# Patient Record
Sex: Female | Born: 1965
Health system: Southern US, Community
[De-identification: ages and names within clinical notes are randomized; demographics above are authoritative.]

## PROBLEM LIST (undated history)

## (undated) DIAGNOSIS — E1165 Type 2 diabetes mellitus with hyperglycemia: Secondary | ICD-10-CM

## (undated) DIAGNOSIS — K219 Gastro-esophageal reflux disease without esophagitis: Secondary | ICD-10-CM

## (undated) DIAGNOSIS — E669 Obesity, unspecified: Secondary | ICD-10-CM

## (undated) DIAGNOSIS — M549 Dorsalgia, unspecified: Secondary | ICD-10-CM

## (undated) DIAGNOSIS — R5383 Other fatigue: Secondary | ICD-10-CM

## (undated) DIAGNOSIS — M2550 Pain in unspecified joint: Secondary | ICD-10-CM

## (undated) DIAGNOSIS — E785 Hyperlipidemia, unspecified: Secondary | ICD-10-CM

## (undated) DIAGNOSIS — M542 Cervicalgia: Secondary | ICD-10-CM

## (undated) DIAGNOSIS — M999 Biomechanical lesion, unspecified: Secondary | ICD-10-CM

## (undated) DIAGNOSIS — I10 Essential (primary) hypertension: Secondary | ICD-10-CM

## (undated) DIAGNOSIS — R0609 Other forms of dyspnea: Secondary | ICD-10-CM

## (undated) DIAGNOSIS — Z8619 Personal history of other infectious and parasitic diseases: Secondary | ICD-10-CM

## (undated) DIAGNOSIS — S46819A Strain of other muscles, fascia and tendons at shoulder and upper arm level, unspecified arm, initial encounter: Secondary | ICD-10-CM

## (undated) DIAGNOSIS — K829 Disease of gallbladder, unspecified: Secondary | ICD-10-CM

## (undated) DIAGNOSIS — E538 Deficiency of other specified B group vitamins: Secondary | ICD-10-CM

## (undated) DIAGNOSIS — E7849 Other hyperlipidemia: Secondary | ICD-10-CM

## (undated) DIAGNOSIS — R7989 Other specified abnormal findings of blood chemistry: Secondary | ICD-10-CM

## (undated) DIAGNOSIS — F418 Other specified anxiety disorders: Secondary | ICD-10-CM

## (undated) DIAGNOSIS — E119 Type 2 diabetes mellitus without complications: Secondary | ICD-10-CM

## (undated) DIAGNOSIS — E559 Vitamin D deficiency, unspecified: Secondary | ICD-10-CM

## (undated) DIAGNOSIS — Z8669 Personal history of other diseases of the nervous system and sense organs: Secondary | ICD-10-CM

## (undated) DIAGNOSIS — E118 Type 2 diabetes mellitus with unspecified complications: Secondary | ICD-10-CM

## (undated) HISTORY — DX: Personal history of other infectious and parasitic diseases: Z86.19

## (undated) HISTORY — DX: Other specified anxiety disorders: F41.8

## (undated) HISTORY — DX: Other specified abnormal findings of blood chemistry: R79.89

## (undated) HISTORY — PX: KIDNEY STONE SURGERY: SHX686

## (undated) HISTORY — DX: Personal history of other diseases of the nervous system and sense organs: Z86.69

## (undated) HISTORY — DX: Cervicalgia: M54.2

## (undated) HISTORY — DX: Essential (primary) hypertension: I10

## (undated) HISTORY — PX: EXPLORATORY LAPAROTOMY: SUR591

## (undated) HISTORY — DX: Other forms of dyspnea: R06.09

## (undated) HISTORY — DX: Disease of gallbladder, unspecified: K82.9

## (undated) HISTORY — DX: Deficiency of other specified B group vitamins: E53.8

## (undated) HISTORY — DX: Vitamin D deficiency, unspecified: E55.9

## (undated) HISTORY — PX: CHOLECYSTECTOMY: SHX55

## (undated) HISTORY — DX: Other hyperlipidemia: E78.49

## (undated) HISTORY — DX: Type 2 diabetes mellitus with hyperglycemia: E11.65

## (undated) HISTORY — DX: Pain in unspecified joint: M25.50

## (undated) HISTORY — DX: Strain of other muscles, fascia and tendons at shoulder and upper arm level, unspecified arm, initial encounter: S46.819A

## (undated) HISTORY — DX: Type 2 diabetes mellitus without complications: E11.9

## (undated) HISTORY — DX: Biomechanical lesion, unspecified: M99.9

## (undated) HISTORY — DX: Type 2 diabetes mellitus with unspecified complications: E11.8

## (undated) HISTORY — DX: Gastro-esophageal reflux disease without esophagitis: K21.9

## (undated) HISTORY — DX: Obesity, unspecified: E66.9

## (undated) HISTORY — DX: Dorsalgia, unspecified: M54.9

## (undated) HISTORY — DX: Hyperlipidemia, unspecified: E78.5

## (undated) HISTORY — DX: Other fatigue: R53.83

---

## 1987-03-30 HISTORY — PX: CHOLECYSTECTOMY: SHX55

## 1998-11-12 ENCOUNTER — Other Ambulatory Visit: Admission: RE | Admit: 1998-11-12 | Discharge: 1998-11-12 | Payer: Self-pay | Admitting: Obstetrics and Gynecology

## 1998-11-14 ENCOUNTER — Encounter: Payer: Self-pay | Admitting: Obstetrics and Gynecology

## 1998-11-14 ENCOUNTER — Ambulatory Visit (HOSPITAL_COMMUNITY): Admission: RE | Admit: 1998-11-14 | Discharge: 1998-11-14 | Payer: Self-pay | Admitting: Obstetrics and Gynecology

## 1999-10-18 ENCOUNTER — Encounter: Payer: Self-pay | Admitting: Neurology

## 1999-10-18 ENCOUNTER — Ambulatory Visit (HOSPITAL_COMMUNITY): Admission: RE | Admit: 1999-10-18 | Discharge: 1999-10-18 | Payer: Self-pay | Admitting: Neurology

## 1999-10-29 ENCOUNTER — Ambulatory Visit (HOSPITAL_COMMUNITY): Admission: RE | Admit: 1999-10-29 | Discharge: 1999-10-29 | Payer: Self-pay | Admitting: Neurology

## 1999-10-29 ENCOUNTER — Encounter: Payer: Self-pay | Admitting: Neurology

## 1999-11-27 ENCOUNTER — Encounter: Payer: Self-pay | Admitting: Family Medicine

## 1999-11-27 ENCOUNTER — Emergency Department (HOSPITAL_COMMUNITY): Admission: EM | Admit: 1999-11-27 | Discharge: 1999-11-27 | Payer: Self-pay | Admitting: Emergency Medicine

## 2001-06-26 ENCOUNTER — Encounter: Admission: RE | Admit: 2001-06-26 | Discharge: 2001-06-26 | Payer: Self-pay

## 2001-10-24 ENCOUNTER — Other Ambulatory Visit: Admission: RE | Admit: 2001-10-24 | Discharge: 2001-10-24 | Payer: Self-pay | Admitting: Obstetrics and Gynecology

## 2007-07-05 ENCOUNTER — Encounter: Admission: RE | Admit: 2007-07-05 | Discharge: 2007-07-05 | Payer: Self-pay | Admitting: Otolaryngology

## 2008-05-22 ENCOUNTER — Encounter: Admission: RE | Admit: 2008-05-22 | Discharge: 2008-06-10 | Payer: Self-pay | Admitting: Occupational Medicine

## 2008-07-04 ENCOUNTER — Encounter: Admission: RE | Admit: 2008-07-04 | Discharge: 2008-07-04 | Payer: Self-pay | Admitting: Family Medicine

## 2008-07-09 ENCOUNTER — Encounter: Admission: RE | Admit: 2008-07-09 | Discharge: 2008-07-09 | Payer: Self-pay | Admitting: Family Medicine

## 2008-10-15 ENCOUNTER — Ambulatory Visit (HOSPITAL_COMMUNITY): Admission: RE | Admit: 2008-10-15 | Discharge: 2008-10-15 | Payer: Self-pay | Admitting: Family Medicine

## 2009-10-14 ENCOUNTER — Ambulatory Visit: Payer: Self-pay | Admitting: Dentistry

## 2009-10-14 ENCOUNTER — Encounter: Admission: AD | Admit: 2009-10-14 | Discharge: 2009-10-14 | Payer: Self-pay | Admitting: Dentistry

## 2009-12-04 ENCOUNTER — Ambulatory Visit: Payer: Self-pay | Admitting: Dentistry

## 2010-04-19 ENCOUNTER — Encounter: Payer: Self-pay | Admitting: Family Medicine

## 2010-05-28 ENCOUNTER — Inpatient Hospital Stay (INDEPENDENT_AMBULATORY_CARE_PROVIDER_SITE_OTHER)
Admission: RE | Admit: 2010-05-28 | Discharge: 2010-05-28 | Disposition: A | Discharge status: Home / Self Care | Payer: 59 | Source: Ambulatory Visit | Attending: Emergency Medicine | Admitting: Emergency Medicine

## 2010-05-28 ENCOUNTER — Emergency Department (HOSPITAL_COMMUNITY)
Admission: EM | Admit: 2010-05-28 | Discharge: 2010-05-28 | Disposition: A | Discharge status: Home / Self Care | Payer: 59 | Attending: Emergency Medicine | Admitting: Emergency Medicine

## 2010-05-28 ENCOUNTER — Emergency Department (HOSPITAL_COMMUNITY): Payer: 59

## 2010-05-28 DIAGNOSIS — F411 Generalized anxiety disorder: Secondary | ICD-10-CM | POA: Insufficient documentation

## 2010-05-28 DIAGNOSIS — E119 Type 2 diabetes mellitus without complications: Secondary | ICD-10-CM | POA: Insufficient documentation

## 2010-05-28 DIAGNOSIS — R7309 Other abnormal glucose: Secondary | ICD-10-CM

## 2010-05-28 DIAGNOSIS — M549 Dorsalgia, unspecified: Secondary | ICD-10-CM

## 2010-05-28 DIAGNOSIS — K219 Gastro-esophageal reflux disease without esophagitis: Secondary | ICD-10-CM | POA: Insufficient documentation

## 2010-05-28 DIAGNOSIS — R0609 Other forms of dyspnea: Secondary | ICD-10-CM

## 2010-05-28 DIAGNOSIS — Z79899 Other long term (current) drug therapy: Secondary | ICD-10-CM | POA: Insufficient documentation

## 2010-05-28 DIAGNOSIS — I10 Essential (primary) hypertension: Secondary | ICD-10-CM | POA: Insufficient documentation

## 2010-05-28 DIAGNOSIS — R0989 Other specified symptoms and signs involving the circulatory and respiratory systems: Secondary | ICD-10-CM

## 2010-05-28 DIAGNOSIS — R0602 Shortness of breath: Secondary | ICD-10-CM | POA: Insufficient documentation

## 2010-05-28 LAB — DIFFERENTIAL
Eosinophils Absolute: 0.2 10*3/uL (ref 0.0–0.7)
Eosinophils Relative: 2 % (ref 0–5)
Lymphocytes Relative: 42 % (ref 12–46)
Lymphs Abs: 3.4 10*3/uL (ref 0.7–4.0)
Monocytes Relative: 8 % (ref 3–12)

## 2010-05-28 LAB — POCT I-STAT 3, VENOUS BLOOD GAS (G3P V)
Bicarbonate: 28 mEq/L — ABNORMAL HIGH (ref 20.0–24.0)
TCO2: 30 mmol/L (ref 0–100)
pH, Ven: 7.289 (ref 7.250–7.300)

## 2010-05-28 LAB — URINALYSIS, ROUTINE W REFLEX MICROSCOPIC
Bilirubin Urine: NEGATIVE
Ketones, ur: 15 mg/dL — AB
Leukocytes, UA: NEGATIVE
Nitrite: NEGATIVE
Protein, ur: NEGATIVE mg/dL
Urobilinogen, UA: 0.2 mg/dL (ref 0.0–1.0)

## 2010-05-28 LAB — GLUCOSE, CAPILLARY: Glucose-Capillary: 279 mg/dL — ABNORMAL HIGH (ref 70–99)

## 2010-05-28 LAB — BASIC METABOLIC PANEL
BUN: 12 mg/dL (ref 6–23)
Chloride: 102 mEq/L (ref 96–112)
Creatinine, Ser: 0.67 mg/dL (ref 0.4–1.2)
Glucose, Bld: 339 mg/dL — ABNORMAL HIGH (ref 70–99)
Potassium: 3.8 mEq/L (ref 3.5–5.1)

## 2010-05-28 LAB — CBC
HCT: 46.9 % — ABNORMAL HIGH (ref 36.0–46.0)
MCH: 31.9 pg (ref 26.0–34.0)
MCV: 90.7 fL (ref 78.0–100.0)
Platelets: 292 10*3/uL (ref 150–400)
RBC: 5.17 MIL/uL — ABNORMAL HIGH (ref 3.87–5.11)
RDW: 12.6 % (ref 11.5–15.5)
WBC: 8 10*3/uL (ref 4.0–10.5)

## 2010-10-06 ENCOUNTER — Ambulatory Visit (HOSPITAL_COMMUNITY)
Admission: RE | Admit: 2010-10-06 | Discharge: 2010-10-06 | Disposition: A | Discharge status: Home / Self Care | Payer: 59 | Source: Ambulatory Visit | Attending: Gastroenterology | Admitting: Gastroenterology

## 2010-10-06 ENCOUNTER — Encounter: Payer: Self-pay | Admitting: Gastroenterology

## 2010-10-06 ENCOUNTER — Ambulatory Visit (INDEPENDENT_AMBULATORY_CARE_PROVIDER_SITE_OTHER): Payer: 59 | Admitting: Gastroenterology

## 2010-10-06 ENCOUNTER — Encounter: Payer: 59 | Admitting: Gastroenterology

## 2010-10-06 DIAGNOSIS — E1165 Type 2 diabetes mellitus with hyperglycemia: Secondary | ICD-10-CM | POA: Insufficient documentation

## 2010-10-06 DIAGNOSIS — K219 Gastro-esophageal reflux disease without esophagitis: Secondary | ICD-10-CM | POA: Insufficient documentation

## 2010-10-06 DIAGNOSIS — K222 Esophageal obstruction: Secondary | ICD-10-CM

## 2010-10-06 DIAGNOSIS — E119 Type 2 diabetes mellitus without complications: Secondary | ICD-10-CM

## 2010-10-06 DIAGNOSIS — R1310 Dysphagia, unspecified: Secondary | ICD-10-CM | POA: Insufficient documentation

## 2010-10-06 DIAGNOSIS — IMO0002 Reserved for concepts with insufficient information to code with codable children: Secondary | ICD-10-CM

## 2010-10-06 HISTORY — DX: Type 2 diabetes mellitus with hyperglycemia: E11.65

## 2010-10-06 HISTORY — DX: Reserved for concepts with insufficient information to code with codable children: IMO0002

## 2010-10-06 HISTORY — DX: Gastro-esophageal reflux disease without esophagitis: K21.9

## 2010-10-06 LAB — GLUCOSE, CAPILLARY: Glucose-Capillary: 309 mg/dL — ABNORMAL HIGH (ref 70–99)

## 2010-10-06 NOTE — Progress Notes (Signed)
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 She believes that she has a food impaction. She has had difficulty swallowing since yesterday. She has been afraid to eat or drink and has not tried to do so. She has discomfort in her chest and feelings of spasm. She has had rare occasions of dysphagia in the past and has pyrosis which is well-controlled with omeprazole.  The patient has non-insulin-dependent diabetes mellitus.    Review of Systems: Pertinent positive and negative review of systems were noted in the above HPI section. All other review of systems were otherwise negative.    Current Medications, Allergies, Past Medical History, Past Surgical History, Family History and Social History were reviewed in [employer] electronic medical record  Vital signs were reviewed in today's medical record. Physical Exam: General: Well developed , well nourished, no acute distress Head: Normocephalic and atraumatic Eyes:  sclerae anicteric, EOMI Ears: Normal auditory acuity Mouth: No deformity or lesions Lungs: Clear throughout to auscultation Heart: Regular rate and rhythm; no murmurs, rubs or bruits Abdomen: Soft, non tender and non distended. No masses, hepatosplenomegaly or hernias noted. Normal Bowel sounds Rectal:deferred Musculoskeletal: Symmetrical with no gross deformities  Pulses:  Normal pulses noted Extremities: No clubbing, cyanosis, edema or deformities noted Neurological: Alert oriented x 4, grossly nonfocal Psychological:  Alert and cooperative. Normal mood and affect

## 2010-10-06 NOTE — Telephone Encounter (Signed)
ERROR

## 2010-10-06 NOTE — Assessment & Plan Note (Signed)
Plan to continue on omeprazole

## 2010-10-06 NOTE — Assessment & Plan Note (Addendum)
Patient may have a food impaction. Esophageal  spasm is also a consideration.  Recommendations #1 upper endoscopy with dilatation or foreign body extraction, as indicated #2 continue PPI therapy

## 2010-11-26 ENCOUNTER — Ambulatory Visit (INDEPENDENT_AMBULATORY_CARE_PROVIDER_SITE_OTHER): Payer: 59 | Admitting: Gastroenterology

## 2010-11-26 ENCOUNTER — Encounter: Payer: Self-pay | Admitting: Gastroenterology

## 2010-11-26 DIAGNOSIS — K219 Gastro-esophageal reflux disease without esophagitis: Secondary | ICD-10-CM

## 2010-11-26 DIAGNOSIS — R1310 Dysphagia, unspecified: Secondary | ICD-10-CM

## 2010-11-26 NOTE — Assessment & Plan Note (Signed)
She remains symptomatic despite taking omeprazole 20 mg daily.  I've given her samples of AcipHex and dexilant. Have also instructed her to increase her omeprazole to 40 mg a day.

## 2010-11-26 NOTE — Progress Notes (Signed)
  History of Present Illness:  Shelley Garcia has returned following esophageal dilatation of a distal stricture. Dysphagia has subsided. Her main complaint at this time his breakthrough pyrosis. She's taking omeprazole 20 mg a day.   Review of Systems: Pertinent positive and negative review of systems were noted in the above HPI section. All other review of systems were otherwise negative.    Current Medications, Allergies, Past Medical History, Past Surgical History, Family History and Social History were reviewed in [employer] electronic medical record  Vital signs were reviewed in today's medical record. Physical Exam: General: Well developed , well nourished, no acute distress

## 2010-11-26 NOTE — Assessment & Plan Note (Signed)
Improved following dilatation of a distal stricture. Plan repeat dilatation as needed

## 2010-11-26 NOTE — Patient Instructions (Signed)
Follow up as needed

## 2010-12-15 ENCOUNTER — Telehealth: Payer: Self-pay | Admitting: Gastroenterology

## 2010-12-15 MED ORDER — ESOMEPRAZOLE MAGNESIUM 40 MG PO CPDR: 40.0000 mg | DELAYED_RELEASE_CAPSULE | Freq: Every day | ORAL | Status: DC

## 2010-12-15 NOTE — Telephone Encounter (Signed)
Called in rx to [facility]

## 2011-05-19 ENCOUNTER — Ambulatory Visit (INDEPENDENT_AMBULATORY_CARE_PROVIDER_SITE_OTHER): Payer: 59 | Admitting: Family Medicine

## 2011-05-19 DIAGNOSIS — N760 Acute vaginitis: Secondary | ICD-10-CM

## 2011-05-19 DIAGNOSIS — R109 Unspecified abdominal pain: Secondary | ICD-10-CM

## 2011-05-19 DIAGNOSIS — N390 Urinary tract infection, site not specified: Secondary | ICD-10-CM

## 2011-05-19 DIAGNOSIS — J069 Acute upper respiratory infection, unspecified: Secondary | ICD-10-CM

## 2011-05-19 DIAGNOSIS — E119 Type 2 diabetes mellitus without complications: Secondary | ICD-10-CM

## 2011-05-19 DIAGNOSIS — IMO0001 Reserved for inherently not codable concepts without codable children: Secondary | ICD-10-CM

## 2011-05-19 DIAGNOSIS — N898 Other specified noninflammatory disorders of vagina: Secondary | ICD-10-CM

## 2011-05-19 DIAGNOSIS — R8281 Pyuria: Secondary | ICD-10-CM

## 2011-05-19 LAB — POCT CBC
Granulocyte percent: 60.8 %G (ref 37–80)
HCT, POC: 50 % — AB (ref 37.7–47.9)
Lymph, poc: 2.8 (ref 0.6–3.4)
MCV: 94.1 fL (ref 80–97)
MID (cbc): 0.6 (ref 0–0.9)
POC Granulocyte: 5.2 (ref 2–6.9)
POC LYMPH PERCENT: 32.4 %L (ref 10–50)
POC MID %: 6.8 %M (ref 0–12)
Platelet Count, POC: 340 10*3/uL (ref 142–424)
RBC: 5.31 M/uL (ref 4.04–5.48)
WBC: 8.5 10*3/uL (ref 4.6–10.2)

## 2011-05-19 LAB — POCT URINALYSIS DIPSTICK
Glucose, UA: 500
Leukocytes, UA: NEGATIVE
Spec Grav, UA: 1.025
Urobilinogen, UA: 0.2

## 2011-05-19 LAB — POCT UA - MICROSCOPIC ONLY: Yeast, UA: NEGATIVE

## 2011-05-19 LAB — POCT WET PREP WITH KOH: Trichomonas, UA: NEGATIVE

## 2011-05-19 MED ORDER — CIPROFLOXACIN HCL 250 MG PO TABS: 250.0000 mg | ORAL_TABLET | Freq: Two times a day (BID) | ORAL | Status: AC

## 2011-05-19 MED ORDER — METRONIDAZOLE 500 MG PO TABS: 500.0000 mg | ORAL_TABLET | Freq: Two times a day (BID) | ORAL | Status: AC

## 2011-05-19 MED ORDER — FLUCONAZOLE 150 MG PO TABS: ORAL_TABLET | ORAL | Status: AC

## 2011-05-19 NOTE — Progress Notes (Signed)
Subjective:    Patient ID: Shelley Garcia, female    DOB: 03-Feb-1966, 46 y.o.   MRN: [id number]  HPI Patient presents with several month history of vaginitis. Completed 2 courses of OTC anti fungal vaginal cream without success. No dysuria.  Also with (R) sided flank/LBP, symptoms intermittent. H/O nephrolithiasis however patients states this pain very different from her kidney stone pain. Not movement related. No recent trauma.  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  H/o IDDM off all meds currently except nexium by patient choice.  Felt medications causing weight gain.   Review of Systems     Objective:   Physical Exam  Constitutional: She appears well-developed and well-nourished.  HENT:  Nose: Rhinorrhea present.  Mouth/Throat: Mucous membranes are dry. Posterior oropharyngeal erythema present.  Neck: Neck supple. No thyromegaly present.  Cardiovascular: Normal rate, regular rhythm and normal heart sounds.   Pulmonary/Chest: Effort normal and breath sounds normal.  Abdominal: Soft. Bowel sounds are normal.   .    Results for orders placed in visit on 05/19/11  POCT WET PREP WITH KOH      Component Value Range   Trichomonas, UA Negative     Clue Cells Wet Prep HPF POC 0-2     Epithelial Wet Prep HPF POC 2-10     Yeast Wet Prep HPF POC negative     Bacteria Wet Prep HPF POC 3+     RBC Wet Prep HPF POC 8-10     WBC Wet Prep HPF POC 1-2     KOH Prep POC Negative    POCT CBC      Component Value Range   WBC 8.5  4.6 - 10.2 (K/uL)   Lymph, poc 2.8  0.6 - 3.4    POC LYMPH PERCENT 32.4  10 - 50 (%L)   MID (cbc) 0.6  0 - 0.9    POC MID % 6.8  0 - 12 (%M)   POC Granulocyte 5.2  2 - 6.9    Granulocyte percent 60.8  37 - 80 (%G)   RBC 5.31  4.04 - 5.48 (M/uL)   Hemoglobin 16.6 (*) 12.2 - 16.2 (g/dL)   HCT, POC 47.8 (*) 29.5 - 47.9 (%)   MCV 94.1  80 - 97 (fL)   MCH, POC 31.3 (*) 27 -  31.2 (pg)   MCHC 33.2  31.8 - 35.4 (g/dL)   RDW, POC 62.1     Platelet Count, POC 340  142 - 424 (K/uL)   MPV 8.8  0 - 99.8 (fL)  POCT URINALYSIS DIPSTICK      Component Value Range   Color, UA yellow     Clarity, UA cloudy     Glucose, UA 500     Bilirubin, UA negative     Ketones, UA trace     Spec Grav, UA 1.025     Blood, UA negative     pH, UA 5.0     Protein, UA trace     Urobilinogen, UA 0.2     Nitrite, UA negative     Leukocytes, UA Negative    POCT UA - MICROSCOPIC ONLY      Component Value Range   WBC, Ur, HPF, POC TNTC     RBC, urine, microscopic TNTC     Bacteria, U Microscopic 3+     Mucus, UA positive     Epithelial cells, urine per  micros 3-4     Crystals, Ur, HPF, POC negative     Casts, Ur, LPF, POC negative     Yeast, UA negative    URINE CULTURE      Component Value Range   Colony Count 15,000 COLONIES/ML     Organism ID, Bacteria Multiple bacterial morphotypes present, none     Organism ID, Bacteria predominant. Suggest appropriate recollection if      Organism ID, Bacteria clinically indicated.         Assessment & Plan:   1. Vaginitis  POCT Wet Prep with KOH, metroNIDAZOLE (FLAGYL) 500 MG tablet, fluconazole (DIFLUCAN) 150 MG tablet  2. Flank pain  POCT CBC, POCT urinalysis dipstick, POCT UA - Microscopic Only, Urine culture  3. Pyuria  ciprofloxacin (CIPRO) 250 MG tablet  4. URI (upper respiratory infection)    5. IDDM (insulin dependent diabetes mellitus)     Stressed the need for ongoing diabetic care and provided names of endocrinologists Declined checking BS today or other related evalution Reassurance regarding current symptoms

## 2011-05-21 LAB — URINE CULTURE: Colony Count: 15000

## 2011-05-23 ENCOUNTER — Encounter: Payer: Self-pay | Admitting: Family Medicine

## 2012-05-17 ENCOUNTER — Ambulatory Visit: Payer: 59

## 2012-05-17 ENCOUNTER — Ambulatory Visit (INDEPENDENT_AMBULATORY_CARE_PROVIDER_SITE_OTHER): Payer: 59 | Admitting: Family Medicine

## 2012-05-17 ENCOUNTER — Encounter: Payer: Self-pay | Admitting: Family Medicine

## 2012-05-17 VITALS — BP 164/92 | HR 99 | Temp 97.7°F | Resp 17 | Ht 64.0 in | Wt 223.4 lb

## 2012-05-17 DIAGNOSIS — Z8669 Personal history of other diseases of the nervous system and sense organs: Secondary | ICD-10-CM

## 2012-05-17 DIAGNOSIS — IMO0001 Reserved for inherently not codable concepts without codable children: Secondary | ICD-10-CM

## 2012-05-17 DIAGNOSIS — R279 Unspecified lack of coordination: Secondary | ICD-10-CM

## 2012-05-17 DIAGNOSIS — E669 Obesity, unspecified: Secondary | ICD-10-CM

## 2012-05-17 DIAGNOSIS — R278 Other lack of coordination: Secondary | ICD-10-CM

## 2012-05-17 DIAGNOSIS — Z820 Family history of epilepsy and other diseases of the nervous system: Secondary | ICD-10-CM

## 2012-05-17 HISTORY — DX: Personal history of other diseases of the nervous system and sense organs: Z86.69

## 2012-05-17 LAB — COMPREHENSIVE METABOLIC PANEL
ALT: 24 U/L (ref 0–35)
AST: 17 U/L (ref 0–37)
CO2: 21 mEq/L (ref 19–32)
Calcium: 9.3 mg/dL (ref 8.4–10.5)
Chloride: 103 mEq/L (ref 96–112)
Creat: 0.57 mg/dL (ref 0.50–1.10)
Potassium: 4.2 mEq/L (ref 3.5–5.3)
Sodium: 135 mEq/L (ref 135–145)
Total Protein: 6.9 g/dL (ref 6.0–8.3)

## 2012-05-17 LAB — TSH: TSH: 3.734 u[IU]/mL (ref 0.350–4.500)

## 2012-05-17 LAB — POCT GLYCOSYLATED HEMOGLOBIN (HGB A1C): Hemoglobin A1C: 10.1

## 2012-05-17 LAB — LIPID PANEL: Total CHOL/HDL Ratio: 7.2 Ratio

## 2012-05-17 MED ORDER — ESOMEPRAZOLE MAGNESIUM 40 MG PO CPDR: 40.0000 mg | DELAYED_RELEASE_CAPSULE | Freq: Every day | ORAL | Status: DC

## 2012-05-17 MED ORDER — SIMVASTATIN 20 MG PO TABS: 20.0000 mg | ORAL_TABLET | Freq: Every evening | ORAL | Status: DC

## 2012-05-17 MED ORDER — LISINOPRIL 20 MG PO TABS: 20.0000 mg | ORAL_TABLET | Freq: Every day | ORAL | Status: DC

## 2012-05-17 MED ORDER — INSULIN GLARGINE 100 UNIT/ML ~~LOC~~ SOLN: 20.0000 [IU] | Freq: Every day | SUBCUTANEOUS | Status: DC

## 2012-05-17 NOTE — Progress Notes (Signed)
Subjective:    Patient ID: Shelley Garcia, female    DOB: 03-04-1966, 47 y.o.   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 She was last seen by a physician 6 months ago. She uses Lantus Insulin  but is distressed at weight gain that occurs with this medication. She has significant GI intolerance  with Metformin.She does not follow a meal plan; meals are skipped.  FSBS have been 300-400.  She has symptoms indicative of poor glycemic control.  Last vision exam was in 2013 by optometrist who saw no fundoscopic damage related to DM.    Pt also has HA disorder diagnosed as "migraine" by headache specialist. She reports coordination   problems and 2-week persistent HA earlier this month. She states she "can live with headache"  by taking Advil. Pt reports some neck discomfort; she has never had imaging of her neck.   Pt is concerned about these changes because this 'is new for her"; she has an aunt 3 years her   senior who has Multiple Sclerosis. She also has a sister who had a brain tumor many years ago.  She is wanting to discuss getting an MRI of brain.    Review of Systems  Constitutional: Positive for fatigue and unexpected weight change. Negative for fever, diaphoresis and activity change.  HENT: Positive for neck pain and voice change. Negative for congestion, neck stiffness and sinus pressure.   Eyes: Positive for visual disturbance.  Respiratory: Negative.   Cardiovascular: Positive for palpitations. Negative for chest pain.  Gastrointestinal: Negative.   Musculoskeletal: Positive for gait problem.  Skin: Negative.   Neurological: Positive for weakness, numbness and headaches.  Psychiatric/Behavioral: Positive for sleep disturbance and decreased concentration. Negative for confusion and dysphoric mood.       Pt's husband reports that she snores but is not having apnea.        Objective:   Physical Exam  Nursing note and vitals reviewed. Constitutional: She is oriented to person, place, and time. She appears well-developed and well-nourished. No distress.  HENT:  Head: Normocephalic and atraumatic.  Right Ear: Hearing, tympanic membrane, external ear and ear canal normal.  Left Ear: Hearing, tympanic membrane, external ear and ear canal normal.  Nose: Nose normal. No nasal deformity or septal deviation.  Mouth/Throat: Uvula is midline, oropharynx is clear and moist and mucous membranes are normal. Oral lesions present. No dental caries.  Eyes: Conjunctivae and lids are normal. Pupils are equal, round, and reactive to light. No scleral icterus.  Neck: Normal range of motion. Neck supple. No thyromegaly present.  Cardiovascular: Normal rate, regular rhythm, normal heart sounds and intact distal pulses.  Exam reveals no gallop and no friction rub.   No murmur heard. Pulmonary/Chest: Effort normal and breath sounds normal. No respiratory distress. She has no wheezes.  Musculoskeletal: Normal range of motion. She exhibits no edema and no tenderness.  Lymphadenopathy:    She has no cervical adenopathy.  Neurological: She is alert and oriented to person, place, and time. She has normal strength. She is not disoriented. No cranial nerve deficit or sensory deficit. She exhibits normal muscle tone. Coordination abnormal. Gait normal.  Reflex Scores:      Tricep reflexes are 1+ on the right side and 1+ on the left side.      Bicep reflexes are 2+ on the right side and  2+ on the left side.      Patellar reflexes are 1+ on the right side and 1+ on the left side. Skin: Skin is warm and dry. No erythema. No pallor.  Psychiatric: She has a normal mood and affect. Her behavior is normal. Judgment and thought content normal.     Results for orders placed in visit on 05/17/12  POCT GLYCOSYLATED HEMOGLOBIN (HGB A1C)      Result Value Range   Hemoglobin A1C 10.1       [facility]  reading (PRIMARY) by  Dr. Audria Nine: Cervical spine- loss of normal curvature suggesting muscle spasm; mild degenerative changes at midlevel.      Assessment & Plan:  Type II or unspecified type diabetes mellitus without mention of complication, uncontrolled - Plan: Amb ref to Medical Nutrition Therapy-MNT;  Increase Lantus to 20 units subcutaneously hs.  TSH, T4, free, Vitamin D 25 hydroxy, Comprehensive metabolic panel, Lipid panel  Obesity, unspecified - Plan: Amb ref to Medical Nutrition Therapy-MNT; advised pt of importance of adequate caloric intake for better metabolism.  History of migraine headaches - Plan:  MR Brain Wo Contrast; Take OCT Magnesium + Calcium + Zinc supplement in addition to Flax Seed Oil supplement.  Coordination impairment - Plan: MR Brain Wo Contrast  Family history of MS (multiple sclerosis) Plan: MR of Brain   Meds ordered this encounter  Medications  . DISCONTD: simvastatin (ZOCOR) 20 MG tablet    Sig: Take 20 mg by mouth every evening.  . simvastatin (ZOCOR) 20 MG tablet    Sig: Take 1 tablet (20 mg total) by mouth every evening.    Dispense:  30 tablet    Refill:  5  . esomeprazole (NEXIUM) 40 MG capsule    Sig: Take 1 capsule (40 mg total) by mouth daily before breakfast.    Dispense:  30 capsule    Refill:  3  . lisinopril (PRINIVIL,ZESTRIL) 20 MG tablet    Sig: Take 1 tablet (20 mg total) by mouth daily.    Dispense:  90 tablet    Refill:  3  . insulin glargine (LANTUS SOLOSTAR) 100 UNIT/ML injection    Sig: Inject 20 Units into the skin at bedtime.    Dispense:  10 mL    Refill:  5

## 2012-05-17 NOTE — Patient Instructions (Signed)
Try OTC Magnesium + Calcium + Zinc supplement to help with headache reduction. Also Flax Seed Oil capsule 1200 mg 1 capsule daily or Fish Oil supplement are helpful for overall well-being.

## 2012-05-20 MED ORDER — SIMVASTATIN 40 MG PO TABS: 40.0000 mg | ORAL_TABLET | Freq: Every day | ORAL | Status: DC

## 2012-05-20 MED ORDER — ERGOCALCIFEROL 1.25 MG (50000 UT) PO CAPS: 50000.0000 [IU] | ORAL_CAPSULE | ORAL | Status: AC

## 2012-05-20 NOTE — Progress Notes (Signed)
I reviewed pt's labs and released them to MyChart; medication changes include- Vit D 50000 units 1 cap once a week and increase Simvastatin to 40 mg daily. Meds routed to pharmacy.

## 2012-05-20 NOTE — Addendum Note (Signed)
Addended by: Dow Adolph B on: 05/20/2012 03:25 PM   Modules accepted: Orders

## 2012-05-21 ENCOUNTER — Telehealth: Payer: Self-pay | Admitting: *Deleted

## 2012-05-21 NOTE — Telephone Encounter (Signed)
[facility] requesting to change Nexium to Protonix.  Protonix will be a zero copay.

## 2012-05-22 MED ORDER — PANTOPRAZOLE SODIUM 40 MG PO TBEC: 40.0000 mg | DELAYED_RELEASE_TABLET | Freq: Every day | ORAL | Status: DC

## 2012-05-22 NOTE — Telephone Encounter (Signed)
Rx sent to pharmacy   

## 2012-05-24 ENCOUNTER — Ambulatory Visit
Admission: RE | Admit: 2012-05-24 | Discharge: 2012-05-24 | Disposition: A | Discharge status: Home / Self Care | Payer: 59 | Source: Ambulatory Visit | Attending: Family Medicine | Admitting: Family Medicine

## 2012-05-24 ENCOUNTER — Other Ambulatory Visit: Payer: Self-pay | Admitting: Radiology

## 2012-05-24 ENCOUNTER — Telehealth: Payer: Self-pay | Admitting: Family Medicine

## 2012-05-24 DIAGNOSIS — Z8669 Personal history of other diseases of the nervous system and sense organs: Secondary | ICD-10-CM

## 2012-05-24 DIAGNOSIS — R278 Other lack of coordination: Secondary | ICD-10-CM

## 2012-05-24 NOTE — Telephone Encounter (Signed)
After review of MRI (brain), I called pt to discuss results w/ her. I left message on mobile phone that I would attempt to call tomorrow.

## 2012-05-25 ENCOUNTER — Encounter: Payer: Self-pay | Admitting: Family Medicine

## 2012-05-25 ENCOUNTER — Telehealth: Payer: Self-pay | Admitting: Family Medicine

## 2012-05-25 DIAGNOSIS — R9089 Other abnormal findings on diagnostic imaging of central nervous system: Secondary | ICD-10-CM

## 2012-05-25 NOTE — Telephone Encounter (Signed)
Pt tried to call back to talk w/Dr Audria Nine about her MRI results. Dr Audria Nine was with a pt, but left message w/Renay that pt can be reached at her cell number if Dr Audria Nine can call her back.

## 2012-05-25 NOTE — Telephone Encounter (Signed)
MRI results are suspicious for Multiple Sclerosis (pt has symptoms c/w that dx as well as family hx). I will place a referral to Neurology for further evaluation. Pt understands and agrees.

## 2012-05-27 ENCOUNTER — Emergency Department (HOSPITAL_COMMUNITY)
Admission: EM | Admit: 2012-05-27 | Discharge: 2012-05-28 | Disposition: A | Discharge status: Home / Self Care | Payer: 59 | Attending: Emergency Medicine | Admitting: Emergency Medicine

## 2012-05-27 ENCOUNTER — Encounter (HOSPITAL_COMMUNITY): Payer: Self-pay | Admitting: *Deleted

## 2012-05-27 DIAGNOSIS — R1013 Epigastric pain: Secondary | ICD-10-CM | POA: Insufficient documentation

## 2012-05-27 DIAGNOSIS — Z79899 Other long term (current) drug therapy: Secondary | ICD-10-CM | POA: Insufficient documentation

## 2012-05-27 DIAGNOSIS — F17200 Nicotine dependence, unspecified, uncomplicated: Secondary | ICD-10-CM | POA: Insufficient documentation

## 2012-05-27 DIAGNOSIS — E119 Type 2 diabetes mellitus without complications: Secondary | ICD-10-CM | POA: Insufficient documentation

## 2012-05-27 DIAGNOSIS — I10 Essential (primary) hypertension: Secondary | ICD-10-CM | POA: Insufficient documentation

## 2012-05-27 DIAGNOSIS — R079 Chest pain, unspecified: Secondary | ICD-10-CM | POA: Insufficient documentation

## 2012-05-27 DIAGNOSIS — R42 Dizziness and giddiness: Secondary | ICD-10-CM | POA: Insufficient documentation

## 2012-05-27 DIAGNOSIS — K219 Gastro-esophageal reflux disease without esophagitis: Secondary | ICD-10-CM | POA: Insufficient documentation

## 2012-05-27 DIAGNOSIS — R51 Headache: Secondary | ICD-10-CM | POA: Insufficient documentation

## 2012-05-27 DIAGNOSIS — Z794 Long term (current) use of insulin: Secondary | ICD-10-CM | POA: Insufficient documentation

## 2012-05-27 NOTE — ED Notes (Signed)
Patient is alert and oriented x3.  She is complaining of near syncope while at work.  She states that she is having a substernal burning pain.  She denies any nausea. She has had a MRI that showed new MS and possible mircopituitary adenoma.

## 2012-05-28 ENCOUNTER — Emergency Department (HOSPITAL_COMMUNITY): Payer: 59

## 2012-05-28 LAB — COMPREHENSIVE METABOLIC PANEL
AST: 13 U/L (ref 0–37)
Albumin: 3.2 g/dL — ABNORMAL LOW (ref 3.5–5.2)
Alkaline Phosphatase: 82 U/L (ref 39–117)
BUN: 16 mg/dL (ref 6–23)
CO2: 21 mEq/L (ref 19–32)
Chloride: 97 mEq/L (ref 96–112)
GFR calc non Af Amer: >90 mL/min (ref >90)
Potassium: 3.7 mEq/L (ref 3.5–5.1)
Total Bilirubin: 0.2 mg/dL — ABNORMAL LOW (ref 0.3–1.2)

## 2012-05-28 LAB — CBC WITH DIFFERENTIAL/PLATELET
Basophils Relative: 0 % (ref 0–1)
HCT: 43.6 % (ref 36.0–46.0)
Hemoglobin: 15.2 g/dL — ABNORMAL HIGH (ref 12.0–15.0)
Lymphocytes Relative: 24 % (ref 12–46)
MCHC: 34.9 g/dL (ref 30.0–36.0)
Monocytes Relative: 6 % (ref 3–12)
Neutro Abs: 8.6 10*3/uL — ABNORMAL HIGH (ref 1.7–7.7)
Neutrophils Relative %: 68 % (ref 43–77)
RBC: 4.79 MIL/uL (ref 3.87–5.11)
WBC: 12.6 10*3/uL — ABNORMAL HIGH (ref 4.0–10.5)

## 2012-05-28 LAB — POCT PREGNANCY, URINE: Preg Test, Ur: NEGATIVE

## 2012-05-28 LAB — URINALYSIS, ROUTINE W REFLEX MICROSCOPIC
Glucose, UA: >1000 mg/dL — AB
Hgb urine dipstick: NEGATIVE
Leukocytes, UA: NEGATIVE
Protein, ur: NEGATIVE mg/dL
Specific Gravity, Urine: 1.028 (ref 1.005–1.030)
Urobilinogen, UA: 0.2 mg/dL (ref 0.0–1.0)

## 2012-05-28 LAB — POCT I-STAT TROPONIN I: Troponin i, poc: 0 ng/mL (ref 0.00–0.08)

## 2012-05-28 LAB — URINE MICROSCOPIC-ADD ON

## 2012-05-28 MED ORDER — KETOROLAC TROMETHAMINE 30 MG/ML IJ SOLN
30.0000 mg | Freq: Once | INTRAMUSCULAR | Status: AC
  Administered 2012-05-28: 30 mg via INTRAVENOUS
  Filled 2012-05-28: qty 1

## 2012-05-28 MED ORDER — DEXAMETHASONE SODIUM PHOSPHATE 10 MG/ML IJ SOLN
10.0000 mg | Freq: Once | INTRAMUSCULAR | Status: AC
  Administered 2012-05-28: 10 mg via INTRAVENOUS
  Filled 2012-05-28: qty 1

## 2012-05-28 MED ORDER — DIPHENHYDRAMINE HCL 50 MG/ML IJ SOLN
25.0000 mg | Freq: Once | INTRAMUSCULAR | Status: AC
  Administered 2012-05-28: 25 mg via INTRAVENOUS
  Filled 2012-05-28: qty 1

## 2012-05-28 MED ORDER — SODIUM CHLORIDE 0.9 % IV BOLUS (SEPSIS)
1000.0000 mL | Freq: Once | INTRAVENOUS | Status: AC
  Administered 2012-05-28: 1000 mL via INTRAVENOUS

## 2012-05-28 MED ORDER — GI COCKTAIL ~~LOC~~
30.0000 mL | Freq: Once | ORAL | Status: AC
  Administered 2012-05-28: 30 mL via ORAL
  Filled 2012-05-28: qty 30

## 2012-05-28 MED ORDER — METOCLOPRAMIDE HCL 5 MG/ML IJ SOLN
10.0000 mg | Freq: Once | INTRAMUSCULAR | Status: AC
  Administered 2012-05-28: 10 mg via INTRAVENOUS
  Filled 2012-05-28: qty 2

## 2012-05-28 NOTE — ED Provider Notes (Signed)
Medical screening examination/treatment/procedure(s) were performed by non-physician practitioner and as supervising physician I was immediately available for consultation/collaboration. Devoria Albe, MD, FACEP   Ward Givens, MD 05/28/12 [phone]

## 2012-05-28 NOTE — ED Provider Notes (Signed)
History     CSN: [id number]  Arrival date & time 05/27/12  2204   First MD Initiated Contact with Patient 05/28/12 0009      Chief Complaint  Patient presents with  . Near Syncope   HPI  History provided by the patient. Patient is a 47 year old female with history of hypertension, diabetes and GERD who presents with complaints of lightheadedness and near syncope. 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 She had an episode where she began to feel lightheaded. Patient sat down and became pale she. She had a slightly elevated heart rate of 106 and a decreased blood pressure with systolic in the 90s very atypical for her. She began to rest and supine some water and symptoms gradually improved. She then began to develop some burning and discomfort in her epigastric and lower sternal chest area. Pain radiates some to the back. She denied feeling any shortness of breath symptoms. Patient does state that she was recently evaluated for numbness, tingling weakness symptoms and extremities and face with an MRI of her brain. She states that this showed concerns for possible MS as well as a possible pituitary adenoma. She has an appointment tomorrow to be seen by neurology specialist. She does report having a daily headache for the past several days it is still present at this time. She has increased use of over-the-counter NSAID medications to treat her headache without improvement.    Past Medical History  Diagnosis Date  . Hypertension   . Diabetes mellitus   . GERD (gastroesophageal reflux disease)     Past Surgical History  Procedure Laterality Date  . Cholecystectomy    . Kidney stone surgery      Family History  Problem Relation Age of Onset  . Hypertension Father   . Hypertension Mother   . Diabetes Father   . Diabetes Mother   . Diabetes Sister   . Lung cancer Father     History  Substance Use Topics  . Smoking status: Current Every Day Smoker --  1.00 packs/day    Types: Cigarettes    Last Attempt to Quit: 05/28/2010  . Smokeless tobacco: Never Used  . Alcohol Use: Yes     Comment: rarely    OB History   Grav Para Term Preterm Abortions TAB SAB Ect Mult Living                  Review of Systems  Constitutional: Negative for fever and chills.  Respiratory: Negative for shortness of breath.   Cardiovascular: Positive for chest pain.  Gastrointestinal: Positive for nausea and abdominal pain. Negative for vomiting.  Neurological: Positive for light-headedness and headaches. Negative for syncope, weakness and numbness.  All other systems reviewed and are negative.    Allergies  Metformin and related and Codeine  Home Medications   Current Outpatient Rx  Name  Route  Sig  Dispense  Refill  . ergocalciferol (DRISDOL) 50000 UNITS capsule   Oral   Take 1 capsule (50,000 Units total) by mouth once a week.   4 capsule   5   . glimepiride (AMARYL) 4 MG tablet   Oral   Take 4 mg by mouth daily before breakfast.         . insulin glargine (LANTUS SOLOSTAR) 100 UNIT/ML injection   Subcutaneous   Inject 20 Units into the skin at bedtime.   10 mL   5   . lisinopril (PRINIVIL,ZESTRIL) 20  MG tablet   Oral   Take 1 tablet (20 mg total) by mouth daily.   90 tablet   3   . pantoprazole (PROTONIX) 40 MG tablet   Oral   Take 1 tablet (40 mg total) by mouth daily.   30 tablet   3   . simvastatin (ZOCOR) 20 MG tablet   Oral   Take 20 mg by mouth every evening.         . simvastatin (ZOCOR) 40 MG tablet   Oral   Take 1 tablet (40 mg total) by mouth at bedtime.   90 tablet   3     BP 142/68  Pulse 87  Temp(Src) 98.4 F (36.9 C) (Oral)  Resp 21  SpO2 95%  LMP 05/03/2012  Physical Exam  Nursing note and vitals reviewed. Constitutional: She is oriented to person, place, and time. She appears well-developed and well-nourished. No distress.  HENT:  Head: Normocephalic and atraumatic.  Cardiovascular:  Normal rate and regular rhythm.   No murmur heard. Pulmonary/Chest: Effort normal and breath sounds normal. No respiratory distress. She has no wheezes. She has no rales.  Abdominal: Soft.  Musculoskeletal: Normal range of motion.  Neurological: She is alert and oriented to person, place, and time. She has normal strength. No sensory deficit.  Skin: Skin is warm and dry. No rash noted.  Psychiatric: She has a normal mood and affect. Her behavior is normal.    ED Course  Procedures  Results for orders placed during the hospital encounter of 05/27/12  CBC WITH DIFFERENTIAL      Result Value Range   WBC 12.6 (*) 4.0 - 10.5 K/uL   RBC 4.79  3.87 - 5.11 MIL/uL   Hemoglobin 15.2 (*) 12.0 - 15.0 g/dL   HCT 16.1  09.6 - 04.5 %   MCV 91.0  78.0 - 100.0 fL   MCH 31.7  26.0 - 34.0 pg   MCHC 34.9  30.0 - 36.0 g/dL   RDW 40.9  81.1 - 91.4 %   Platelets 318  150 - 400 K/uL   Neutrophils Relative 68  43 - 77 %   Neutro Abs 8.6 (*) 1.7 - 7.7 K/uL   Lymphocytes Relative 24  12 - 46 %   Lymphs Abs 3.1  0.7 - 4.0 K/uL   Monocytes Relative 6  3 - 12 %   Monocytes Absolute 0.8  0.1 - 1.0 K/uL   Eosinophils Relative 1  0 - 5 %   Eosinophils Absolute 0.2  0.0 - 0.7 K/uL   Basophils Relative 0  0 - 1 %   Basophils Absolute 0.0  0.0 - 0.1 K/uL  COMPREHENSIVE METABOLIC PANEL      Result Value Range   Sodium 131 (*) 135 - 145 mEq/L   Potassium 3.7  3.5 - 5.1 mEq/L   Chloride 97  96 - 112 mEq/L   CO2 21  19 - 32 mEq/L   Glucose, Bld 383 (*) 70 - 99 mg/dL   BUN 16  6 - 23 mg/dL   Creatinine, Ser 7.82  0.50 - 1.10 mg/dL   Calcium 8.9  8.4 - 95.6 mg/dL   Total Protein 7.2  6.0 - 8.3 g/dL   Albumin 3.2 (*) 3.5 - 5.2 g/dL   AST 13  0 - 37 U/L   ALT 15  0 - 35 U/L   Alkaline Phosphatase 82  39 - 117 U/L   Total Bilirubin 0.2 (*) 0.3 - 1.2  mg/dL   GFR calc non Af Amer >90  >90 mL/min   GFR calc Af Amer >90  >90 mL/min  URINALYSIS, ROUTINE W REFLEX MICROSCOPIC      Result Value Range   Color,  Urine YELLOW  YELLOW   APPearance CLEAR  CLEAR   Specific Gravity, Urine 1.028  1.005 - 1.030   pH 5.5  5.0 - 8.0   Glucose, UA >1000 (*) NEGATIVE mg/dL   Hgb urine dipstick NEGATIVE  NEGATIVE   Bilirubin Urine NEGATIVE  NEGATIVE   Ketones, ur NEGATIVE  NEGATIVE mg/dL   Protein, ur NEGATIVE  NEGATIVE mg/dL   Urobilinogen, UA 0.2  0.0 - 1.0 mg/dL   Nitrite NEGATIVE  NEGATIVE   Leukocytes, UA NEGATIVE  NEGATIVE  URINE MICROSCOPIC-ADD ON      Result Value Range   Squamous Epithelial / LPF FEW (*) RARE   Casts HYALINE CASTS (*) NEGATIVE  LIPASE, BLOOD      Result Value Range   Lipase 32  11 - 59 U/L  POCT PREGNANCY, URINE      Result Value Range   Preg Test, Ur NEGATIVE  NEGATIVE  POCT I-STAT TROPONIN I      Result Value Range   Troponin i, poc 0.00  0.00 - 0.08 ng/mL   Comment 3                Dg Chest 2 View  05/28/2012  *RADIOLOGY REPORT*  Clinical Data: Chest pain  CHEST - 2 VIEW  Comparison: 05/28/2010  Findings: Hypoaeration.  No focal consolidation, pleural effusion, or pneumothorax.  Cardiomediastinal contours within normal range. Surgical clips right upper quadrant.  No acute osseous finding.  IMPRESSION: Hypoaeration, without acute process identified.   Original Report Authenticated By: Jearld Lesch, M.D.      1. Lightheadedness   2. GERD (gastroesophageal reflux disease)       MDM  Patient seen and evaluated. Patient currently appears well in no acute distress.  Patient discussed with Attending Physician.  Will add lipase level.    Lab tests, EKG and chest x-ray unremarkable. Patient reports having significant improvements of epigastric burning after GI cocktail. Headache has also significantly improved and nearly completely resolved after medications. At this time patient is stable for discharge home. She has good followup planned including neurology appointment on Monday.      Date: 05/28/2012  Rate: 78  Rhythm: normal sinus rhythm  QRS Axis:  normal  Intervals: normal  ST/T Wave abnormalities: nonspecific T wave changes  Conduction Disutrbances:none  Narrative Interpretation:   Old EKG Reviewed: unchanged from 05/28/2010     Angus Seller, PA 05/28/12 [phone]

## 2012-06-13 ENCOUNTER — Other Ambulatory Visit: Payer: Self-pay | Admitting: Diagnostic Neuroimaging

## 2012-06-13 MED ORDER — CYANOCOBALAMIN 1000 MCG/ML IJ SOLN: INTRAMUSCULAR | Status: DC

## 2012-06-13 MED ORDER — GABAPENTIN 300 MG PO CAPS: 300.0000 mg | ORAL_CAPSULE | Freq: Every day | ORAL | Status: DC

## 2012-06-13 MED ORDER — "SYRINGE 25G X 1"" 3 ML MISC": Status: DC

## 2012-06-13 NOTE — Telephone Encounter (Signed)
Pt calling for medications (B12 and gabapentin).  Had been to [facility] out pt pharm twice and they have not been there.  Orders placed.

## 2012-06-22 ENCOUNTER — Encounter: Payer: Self-pay | Admitting: Dietician

## 2012-06-22 ENCOUNTER — Encounter: Payer: 59 | Attending: Family Medicine | Admitting: Dietician

## 2012-06-22 DIAGNOSIS — E119 Type 2 diabetes mellitus without complications: Secondary | ICD-10-CM | POA: Insufficient documentation

## 2012-06-22 DIAGNOSIS — Z713 Dietary counseling and surveillance: Secondary | ICD-10-CM | POA: Insufficient documentation

## 2012-06-22 NOTE — Progress Notes (Signed)
  Patient was seen on 06/22/2012 for the first of a series of three diabetes self-management courses at the [facility]. The following learning objectives were met by the patient during this course:  HT: 63.5 in  WT: 211.1 lb  BMI: 38.6 Kg/M2 A1C : 10.1  05/17/2012   Defines the role of glucose and insulin  Identifies type of diabetes and pathophysiology  Defines the diagnostic criteria for diabetes and prediabetes  States the risk factors for Type 2 Diabetes  States the symptoms of Type 2 Diabetes  Defines Type 2 Diabetes treatment goals  Defines Type 2 Diabetes treatment options  States the rationale for glucose monitoring  Identifies A1C, glucose targets, and testing times  Identifies proper sharps disposal  Defines the purpose of a diabetes food plan  Identifies carbohydrate food groups  Defines effects of carbohydrate foods on glucose levels  Identifies carbohydrate choices/grams/food labels  States benefits of physical activity and effect on glucose  Review of suggested activity guidelines  Handouts given during class include:  Type 2 Diabetes: Basics Book  My Food Plan Book  Food and Activity Log  PFollow-Up Plan: Sign-up to complete the Diabetes Core Classes.

## 2012-06-29 ENCOUNTER — Other Ambulatory Visit: Payer: Self-pay | Admitting: Family Medicine

## 2012-06-29 DIAGNOSIS — IMO0001 Reserved for inherently not codable concepts without codable children: Secondary | ICD-10-CM

## 2012-07-04 ENCOUNTER — Ambulatory Visit: Payer: 59 | Admitting: Internal Medicine

## 2012-07-27 ENCOUNTER — Ambulatory Visit: Payer: 59 | Admitting: Family Medicine

## 2012-07-27 ENCOUNTER — Encounter: Payer: 59 | Attending: Family Medicine | Admitting: Dietician

## 2012-07-27 DIAGNOSIS — Z713 Dietary counseling and surveillance: Secondary | ICD-10-CM | POA: Insufficient documentation

## 2012-07-27 DIAGNOSIS — E119 Type 2 diabetes mellitus without complications: Secondary | ICD-10-CM | POA: Insufficient documentation

## 2012-07-29 NOTE — Progress Notes (Signed)
  Patient was seen on 07/27/2012 for the second of a series of three diabetes self-management courses at the [facility]. The following learning objectives were met by the patient during this course:   Explain basic nutrition maintenance and quality assurance  Describe causes, symptoms and treatment of hypoglycemia and hyperglycemia  Explain how to manage diabetes during illness  Describe the importance of good nutrition for health and healthy eating strategies  List strategies to follow meal plan when dining out  Describe the effects of alcohol on glucose and how to use it safely  Describe problem solving skills for day-to-day glucose challenges  Describe strategies to use when treatment plan needs to change  Identify important factors involved in successful weight loss  Describe ways to remain physically active  Describe the impact of regular activity on insulin resistance  Handouts given in class:  Refrigerator magnet for Sick Day Guidelines  [facility] Oral medication/insulin handout  Nutritional Strategies for Weight Loss with Diabetes  Follow-Up Plan: Patient will attend the final class of the ADA Diabetes Self-Care Education.   

## 2013-02-01 ENCOUNTER — Other Ambulatory Visit: Payer: Self-pay

## 2013-07-17 ENCOUNTER — Other Ambulatory Visit: Payer: Self-pay | Admitting: Family Medicine

## 2013-07-17 ENCOUNTER — Other Ambulatory Visit: Payer: Self-pay | Admitting: *Deleted

## 2013-07-17 ENCOUNTER — Ambulatory Visit (INDEPENDENT_AMBULATORY_CARE_PROVIDER_SITE_OTHER): Payer: 59 | Admitting: Internal Medicine

## 2013-07-17 ENCOUNTER — Telehealth: Payer: Self-pay

## 2013-07-17 ENCOUNTER — Encounter: Payer: Self-pay | Admitting: Internal Medicine

## 2013-07-17 VITALS — BP 112/68 | HR 84 | Temp 98.2°F | Resp 12 | Ht 63.5 in | Wt 226.0 lb

## 2013-07-17 DIAGNOSIS — E1165 Type 2 diabetes mellitus with hyperglycemia: Secondary | ICD-10-CM

## 2013-07-17 DIAGNOSIS — E118 Type 2 diabetes mellitus with unspecified complications: Principal | ICD-10-CM

## 2013-07-17 DIAGNOSIS — IMO0002 Reserved for concepts with insufficient information to code with codable children: Secondary | ICD-10-CM

## 2013-07-17 MED ORDER — DULAGLUTIDE 1.5 MG/0.5ML ~~LOC~~ SOAJ: SUBCUTANEOUS | Status: DC

## 2013-07-17 MED ORDER — INSULIN GLARGINE 100 UNIT/ML ~~LOC~~ SOLN: 25.0000 [IU] | Freq: Every day | SUBCUTANEOUS | Status: DC

## 2013-07-17 MED ORDER — CANAGLIFLOZIN 300 MG PO TABS: ORAL_TABLET | ORAL | Status: DC

## 2013-07-17 NOTE — Patient Instructions (Addendum)
Please increase Lantus to 25 units at night. Start Trulicity at 4.23 mg once a week on Sundays. Start Invokana 100 mg in am daily and increase to 300 mg after a week. Please try to eat 3 meals a day. Please return in 1 month with your sugar log.   PATIENT INSTRUCTIONS FOR TYPE 2 DIABETES:   DIET AND EXERCISE Diet and exercise is an important part of diabetic treatment.  We recommended aerobic exercise in the form of brisk walking (working between 40-60% of maximal aerobic capacity, similar to brisk walking) for 150 minutes per week (such as 30 minutes five days per week) along with 3 times per week performing 'resistance' training (using various gauge rubber tubes with handles) 5-10 exercises involving the major muscle groups (upper body, lower body and core) performing 10-15 repetitions (or near fatigue) each exercise. Start at half the above goal but build slowly to reach the above goals. If limited by weight, joint pain, or disability, we recommend daily walking in a swimming pool with water up to waist to reduce pressure from joints while allow for adequate exercise.    BLOOD GLUCOSES Monitoring your blood glucoses is important for continued management of your diabetes. Please check your blood glucoses 2-4 times a day: fasting, before meals and at bedtime (you can rotate these measurements - e.g. one day check before the 3 meals, the next day check before 2 of the meals and before bedtime, etc.   HYPOGLYCEMIA (low blood sugar) Hypoglycemia is usually a reaction to not eating, exercising, or taking too much insulin/ other diabetes drugs.  Symptoms include tremors, sweating, hunger, confusion, headache, etc. Treat IMMEDIATELY with 15 grams of Carbs:   4 glucose tablets    cup regular juice/soda   2 tablespoons raisins   4 teaspoons sugar   1 tablespoon honey Recheck blood glucose in 15 mins and repeat above if still symptomatic/blood glucose <100.  RECOMMENDATIONS TO REDUCE YOUR RISK OF  DIABETIC COMPLICATIONS: * Take your prescribed MEDICATION(S). * Follow a DIABETIC diet: Complex carbs, fiber rich foods, heart healthy fish twice weekly, (monounsaturated and polyunsaturated) fats * AVOID saturated/trans fats, high fat foods, >2,300 mg salt per day. * EXERCISE at least 5 times a week for 30 minutes or preferably daily.  * DO NOT SMOKE OR DRINK more than 1 drink a day. * Check your FEET every day. Do not wear tightfitting shoes. Contact us if you develop an ulcer * See your EYE doctor once a year or more if needed * Get a FLU shot once a year * Get a PNEUMONIA vaccine once before and once after age 7 years  GOALS:  * Your Hemoglobin A1c of <7%  * fasting sugars need to be <130 * after meals sugars need to be <180 (2h after you start eating) * Your Systolic BP should be 536 or lower  * Your Diastolic BP should be 80 or lower  * Your HDL (Good Cholesterol) should be 40 or higher  * Your LDL (Bad Cholesterol) should be 100 or lower  * Your Triglycerides should be 150 or lower  * Your Urine microalbumin (kidney function) should be <30 * Your Body Mass Index should be 25 or lower  We will be glad to help you achieve these goals.

## 2013-07-17 NOTE — Progress Notes (Signed)
Patient ID: Shelley Garcia, female   DOB: 05/28/1965, 48 y.o.   MRN: [id number]  HPI: Shelley Garcia is a 48 y.o.-year-old female, referred by her PCP, Dr. Leward Quan, for management of DM2, insulin-dependent, uncontrolled, with complications (Cerebrovascular ds).   Patient has been diagnosed with diabetes in 2010; she started insulin 2011. Last hemoglobin A1c was: Lab Results  Component Value Date   HGBA1C 10.1 05/17/2012  She tells me HbA1c was always around 10%.  Pt is on a regimen of: - Glimepiride 4 mg daily in am - occasionally she can miss this (2x a month) - Lantus 20 units at 5:30 pm (works nights) - does not miss it Tried Metformin >> N/V/D/AP. Does not want to retry, not even XR.  Pt checks her sugars 2x a week and they are in 200s: - 4 pm, fasting >> 200s No lows. Lowest sugar was 180; ? she has hypoglycemia awareness (160s >> feels bad) Highest sugar was 400s.  Pt's meals are: - Breakfast: coffee - Lunch (midnight): sandwich or salad - Dinner: protein + veggie + starch - Snacks: 2x  She is going to the [employer].  She has dental abscesses >> surgery for extractions 07/2013.   - no CKD, last BUN/creatinine:  Lab Results  Component Value Date   BUN 16 05/27/2012   CREATININE 0.69 05/27/2012  On Lisinopril.  - last set of lipids: Lab Results  Component Value Date   CHOL 272* 05/17/2012   HDL 38* 05/17/2012   LDLCALC 165* 05/17/2012   TRIG 343* 05/17/2012   CHOLHDL 7.2 05/17/2012  She stopped Zocor 8 mo ago b/c fatigue, weakness, fatigue. - last eye exam was in ~2 years. No DR.  - no numbness and tingling in her feet.  Pt has FH of DM in father, sister, mother. Also FH of strokes: mother and sister (sister recently died recently).   ROS: Constitutional: + weight gain, no fatigue, no subjective hyperthermia/hypothermia Eyes: no blurry vision, no xerophthalmia ENT: no sore throat, no nodules palpated in throat, no dysphagia/odynophagia, no hoarseness, +  decreased hearing Cardiovascular: no CP/SOB/+ palpitations/no leg swelling Respiratory: no cough/SOB Gastrointestinal: + N/no V/D/C, + heartburn Musculoskeletal: no muscle/+ joint aches Skin: no rashes, + hair loss Neurological: no tremors/numbness/tingling/dizziness, + HA Psychiatric: no depression/anxiety Low libido  Past Medical History  Diagnosis Date  . Hypertension   . Diabetes mellitus   . GERD (gastroesophageal reflux disease)   . Hyperlipidemia   . Obesity    Past Surgical History  Procedure Laterality Date  . Cholecystectomy    . Kidney stone surgery    . Exploratory laparotomy     History   Social History  . Marital Status: Married    Spouse Name: N/A    Number of Children: 2   Occupational History  . Nurse [employer]   Social History Main Topics  . Smoking status: Current Every Day Smoker -- 1.5 packs/day    Types: Cigarettes    Last Attempt to Quit: 05/28/2010  . Smokeless tobacco: Never Used  . Alcohol Use: Yes     Comment: rarely  . Drug Use: No   Current Outpatient Prescriptions on File Prior to Visit  Medication Sig Dispense Refill  . gabapentin (NEURONTIN) 300 MG capsule Take 1 capsule (300 mg total) by mouth at bedtime.  30 capsule  6  . glimepiride (AMARYL) 4 MG tablet Take 4 mg by mouth daily before breakfast.      . lisinopril (PRINIVIL,ZESTRIL) 20 MG  tablet Take 1 tablet (20 mg total) by mouth daily.  90 tablet  3  . pantoprazole (PROTONIX) 40 MG tablet Take 1 tablet (40 mg total) by mouth daily.  30 tablet  3  . Syringe/Needle, Disp, (SYRINGE 3CC/25GX1") 25G X 1" 3 ML MISC To use with Vitamin B 12 injections.  50 each  0  . aspirin EC 81 MG tablet Take 81 mg by mouth daily.      . cyanocobalamin (,VITAMIN B-12,) 1000 MCG/ML injection Inject cyanocobalamin 1031mcg/ 50ml once weekly for 4 weeks then once monthly  4 mL  11  . simvastatin (ZOCOR) 20 MG tablet Take 20 mg by mouth every evening.      . simvastatin (ZOCOR) 40 MG tablet Take 1  tablet (40 mg total) by mouth at bedtime.  90 tablet  3   No current facility-administered medications on file prior to visit.   Allergies  Allergen Reactions  . Metformin And Related     Made pt really sick  . Codeine Rash   Family History  Problem Relation Age of Onset  . Hypertension Father   . Hypertension Mother   . Diabetes Father   . Diabetes Mother   . Diabetes Sister   . Lung cancer Father    PE: BP 112/68  Pulse 84  Temp(Src) 98.2 F (36.8 C) (Oral)  Resp 12  Ht 5' 3.5" (1.613 m)  Wt 226 lb (102.513 kg)  BMI 39.40 kg/m2  SpO2 97% Wt Readings from Last 3 Encounters:  07/17/13 226 lb (102.513 kg)  06/22/12 221 lb 1.6 oz (100.29 kg)  05/17/12 223 lb 6.4 oz (101.334 kg)   Constitutional: overweight, in NAD Eyes: PERRLA, EOMI, no exophthalmos ENT: moist mucous membranes, no thyromegaly, no cervical lymphadenopathy Cardiovascular: RRR, No MRG Respiratory: CTA B Gastrointestinal: abdomen soft, NT, ND, BS+ Musculoskeletal: no deformities, strength intact in all 4 Skin: moist, warm, no rashes Neurological: no tremor with outstretched hands, DTR normal in all 4  ASSESSMENT: 1. DM2, insulin-dependent, uncontrolled, without complications - Cerebrovascular ds. ("ministrokes") - MRI not in the system (was seen by neurology) - per pt  PLAN:  1. Patient with 5 year h/o uncontrolled diabetes, on oral antidiabetic regimen + basal insulin, which became insufficient. - We discussed about options for treatment, and I suggested to:  Patient Instructions  Please increase Lantus to 25 units at night. Start Trulicity at 1.61 mg once a week on Sundays and increase to 1.5 mg after a week. Start Invokana 100 mg in am daily and increase to 300 mg after a week. Please try to eat 3 meals a day. Please return in 1 month with your sugar log.  - we discussed about SEs of Invokana, which are: dizziness (advised to be careful when stands from sitting position), decreased BP - usually  not < normal (BP today is not low), and fungal UTIs (advised to let me know if develops one).  - Strongly advised her to start checking sugars at different times of the day - check 2 times a day, rotating checks - discussed about diet >> suggested healthy changes - given sugar log and advised how to fill it and to bring it at next appt  - given foot care handout and explained the principles  - given instructions for hypoglycemia management "15-15 rule"  - advised for yearly eye exams - Return to clinic in 1 mo with sugar log

## 2013-07-17 NOTE — Telephone Encounter (Signed)
Dr.Mcpherson, Pt would like to have a rx for chantix so she can discontinue smoking. She does have an upcoming appt on 07/26/13. Best# [phone]

## 2013-07-24 NOTE — Telephone Encounter (Signed)
She should discuss this at time of office visit. My chart message sent to patient to advise. Has been greater than one year since office visit. Risk and benefits should be discussed and depression screening done.

## 2013-07-25 ENCOUNTER — Encounter: Payer: Self-pay | Admitting: Internal Medicine

## 2013-07-26 ENCOUNTER — Ambulatory Visit (INDEPENDENT_AMBULATORY_CARE_PROVIDER_SITE_OTHER): Payer: 59 | Admitting: Family Medicine

## 2013-07-26 ENCOUNTER — Other Ambulatory Visit: Payer: Self-pay | Admitting: Internal Medicine

## 2013-07-26 ENCOUNTER — Encounter: Payer: Self-pay | Admitting: Family Medicine

## 2013-07-26 VITALS — BP 145/85 | HR 95 | Temp 97.9°F | Resp 16 | Ht 64.0 in | Wt 222.0 lb

## 2013-07-26 DIAGNOSIS — F341 Dysthymic disorder: Secondary | ICD-10-CM

## 2013-07-26 DIAGNOSIS — F17200 Nicotine dependence, unspecified, uncomplicated: Secondary | ICD-10-CM

## 2013-07-26 DIAGNOSIS — Z6379 Other stressful life events affecting family and household: Secondary | ICD-10-CM

## 2013-07-26 DIAGNOSIS — F418 Other specified anxiety disorders: Secondary | ICD-10-CM

## 2013-07-26 DIAGNOSIS — Z818 Family history of other mental and behavioral disorders: Secondary | ICD-10-CM

## 2013-07-26 MED ORDER — EXENATIDE ER 2 MG ~~LOC~~ PEN: 2.0000 mg | PEN_INJECTOR | SUBCUTANEOUS | Status: DC

## 2013-07-26 MED ORDER — BUPROPION HCL ER (XL) 150 MG PO TB24: 150.0000 mg | ORAL_TABLET | Freq: Every day | ORAL | Status: DC

## 2013-07-26 MED ORDER — BUPROPION HCL ER (XL) 150 MG PO TB24: ORAL_TABLET | ORAL | Status: DC

## 2013-07-26 MED ORDER — PANTOPRAZOLE SODIUM 40 MG PO TBEC: 40.0000 mg | DELAYED_RELEASE_TABLET | Freq: Every day | ORAL | Status: DC

## 2013-07-26 NOTE — Patient Instructions (Addendum)
Panic Attacks Panic attacks are sudden, short feelings of great fear or discomfort. You may have them for no reason when you are relaxed, when you are uneasy (anxious), or when you are sleeping.  HOME CARE  Take all your medicines as told.  Check with your doctor before starting new medicines.  Keep all doctor visits. GET HELP IF:  You are not able to take your medicines as told.  Your symptoms do not get better.  Your symptoms get worse. GET HELP RIGHT AWAY IF:  Your attacks seem different than your normal attacks.  You have thoughts about hurting yourself or others.  You take panic attack medicine and you have a side effect. MAKE SURE YOU:  Understand these instructions.  Will watch your condition.  Will get help right away if you are not doing well or get worse. Document Released: 04/17/2010 Document Revised: 01/03/2013 Document Reviewed: 10/27/2012 [facility] Patient Information 2014 [place], [place].     Stress Management Stress is a state of physical or mental tension that often results from changes in your life or normal routine. Some common causes of stress are:  Death of a loved one.  Injuries or severe illnesses.  Getting fired or changing jobs.  Moving into a new home. Other causes may be:  Sexual problems.  Business or financial losses.  Taking on a large debt.  Regular conflict with someone at home or at work.  Constant tiredness from lack of sleep. It is not just bad things that are stressful. It may be stressful to:  Win the lottery.  Get married.  Buy a new car. The amount of stress that can be easily tolerated varies from person to person. Changes generally cause stress, regardless of the types of change. Too much stress can affect your health. It may lead to physical or emotional problems. Too little stress (boredom) may also become stressful. SUGGESTIONS TO REDUCE STRESS:  Talk things over with your family and friends. It often is  helpful to share your concerns and worries. If you feel your problem is serious, you may want to get help from a professional counselor.  Consider your problems one at a time instead of lumping them all together. Trying to take care of everything at once may seem impossible. List all the things you need to do and then start with the most important one. Set a goal to accomplish 2 or 3 things each day. If you expect to do too many in a single day you will naturally fail, causing you to feel even more stressed.  Do not use alcohol or drugs to relieve stress. Although you may feel better for a short time, they do not remove the problems that caused the stress. They can also be habit forming.  Exercise regularly - at least 3 times per week. Physical exercise can help to relieve that "uptight" feeling and will relax you.  The shortest distance between despair and hope is often a good night's sleep.  Go to bed and get up on time allowing yourself time for appointments without being rushed.  Take a short "time-out" period from any stressful situation that occurs during the day. Close your eyes and take some deep breaths. Starting with the muscles in your face, tense them, hold it for a few seconds, then relax. Repeat this with the muscles in your neck, shoulders, hand, stomach, back and legs.  Take good care of yourself. Eat a balanced diet and get plenty of rest.  Schedule time  for having fun. Take a break from your daily routine to relax. HOME CARE INSTRUCTIONS   Call if you feel overwhelmed by your problems and feel you can no longer manage them on your own.  Return immediately if you feel like hurting yourself or someone else. Document Released: 09/08/2000 Document Revised: 06/07/2011 Document Reviewed: 11/07/2012 [facility] Patient Information 2014 [place], [place].    Bupropion extended-release tablets (Depression/Mood Disorders) What is this medicine? BUPROPION (byoo PROE pee on) is used to  treat depression. This medicine may be used for other purposes; ask your health care provider or pharmacist if you have questions. COMMON BRAND NAME(S): Aplenzin, Budeprion XL , Forfivo XL, Wellbutrin XL What should I tell my health care provider before I take this medicine? They need to know if you have any of these conditions: -an eating disorder, such as anorexia or bulimia -bipolar disorder or psychosis -diabetes or high blood sugar, treated with medication -glaucoma -head injury or brain tumor -heart disease, previous heart attack, or irregular heart beat -high blood pressure -kidney or liver disease -seizures (convulsions) -suicidal thoughts or a previous suicide attempt -Tourette's syndrome -weight loss -an unusual or allergic reaction to bupropion, other medicines, foods, dyes, or preservatives -breast-feeding -pregnant or trying to become pregnant How should I use this medicine? Take this medicine by mouth with a glass of water. Follow the directions on the prescription label. You can take it with or without food. If it upsets your stomach, take it with food. Do not crush, chew, or cut these tablets. This medicine is taken once daily at the same time each day. Do not take your medicine more often than directed. Do not stop taking this medicine suddenly except upon the advice of your doctor. Stopping this medicine too quickly may cause serious side effects or your condition may worsen. A special MedGuide will be given to you by the pharmacist with each prescription and refill. Be sure to read this information carefully each time. Talk to your pediatrician regarding the use of this medicine in children. Special care may be needed. Overdosage: If you think you have taken too much of this medicine contact a poison control center or emergency room at once. NOTE: This medicine is only for you. Do not share this medicine with others. What if I miss a dose? If you miss a dose, skip the  missed dose and take your next tablet at the regular time. Do not take double or extra doses. What may interact with this medicine? Do not take this medicine with any of the following medications: -linezolid -MAOIs like Azilect, Carbex, Eldepryl, Marplan, Nardil, and Parnate -methylene blue (injected into a vein) -other medicines that contain bupropion like Zyban This medicine may also interact with the following medications: -alcohol -certain medicines for anxiety or sleep -certain medicines for blood pressure like metoprolol, propranolol -certain medicines for depression or psychotic disturbances -certain medicines for HIV or AIDS like efavirenz, lopinavir, nelfinavir, ritonavir -certain medicines for irregular heart beat like propafenone, flecainide -certain medicines for Parkinson's disease like amantadine, levodopa -certain medicines for seizures like carbamazepine, phenytoin, phenobarbital -cimetidine -clopidogrel -cyclophosphamide -furazolidone -isoniazid -nicotine -orphenadrine -procarbazine -steroid medicines like prednisone or cortisone -stimulant medicines for attention disorders, weight loss, or to stay awake -tamoxifen -theophylline -thiotepa -ticlopidine -tramadol -warfarin This list may not describe all possible interactions. Give your health care provider a list of all the medicines, herbs, non-prescription drugs, or dietary supplements you use. Also tell them if you smoke, drink alcohol, or use illegal drugs.  Some items may interact with your medicine. What should I watch for while using this medicine? Tell your doctor if your symptoms do not get better or if they get worse. Visit your doctor or health care professional for regular checks on your progress. Because it may take several weeks to see the full effects of this medicine, it is important to continue your treatment as prescribed by your doctor. Patients and their families should watch out for new or  worsening thoughts of suicide or depression. Also watch out for sudden changes in feelings such as feeling anxious, agitated, panicky, irritable, hostile, aggressive, impulsive, severely restless, overly excited and hyperactive, or not being able to sleep. If this happens, especially at the beginning of treatment or after a change in dose, call your health care professional. Avoid alcoholic drinks while taking this medicine. Drinking large amounts of alcoholic beverages, using sleeping or anxiety medicines, or quickly stopping the use of these agents while taking this medicine may increase your risk for a seizure. Do not drive or use heavy machinery until you know how this medicine affects you. This medicine can impair your ability to perform these tasks. Do not take this medicine close to bedtime. It may prevent you from sleeping. Your mouth may get dry. Chewing sugarless gum or sucking hard candy, and drinking plenty of water may help. Contact your doctor if the problem does not go away or is severe. The tablet shell for some brands of this medicine does not dissolve. This is normal. The tablet shell may appear whole in the stool. This is not a cause for concern. What side effects may I notice from receiving this medicine? Side effects that you should report to your doctor or health care professional as soon as possible: -allergic reactions like skin rash, itching or hives, swelling of the face, lips, or tongue -breathing problems -changes in vision -confusion -fast or irregular heartbeat -hallucinations -increased blood pressure -redness, blistering, peeling or loosening of the skin, including inside the mouth -seizures -suicidal thoughts or other mood changes -unusually weak or tired -vomiting Side effects that usually do not require medical attention (report to your doctor or health care professional if they continue or are bothersome): -change in sex drive or  performance -constipation -headache -loss of appetite -nausea -tremors -weight loss This list may not describe all possible side effects. Call your doctor for medical advice about side effects. You may report side effects to FDA at 1-800-FDA-1088. Where should I keep my medicine? Keep out of the reach of children. Store at room temperature between 15 and 30 degrees C (59 and 86 degrees F). Throw away any unused medicine after the expiration date. NOTE: This sheet is a summary. It may not cover all possible information. If you have questions about this medicine, talk to your doctor, pharmacist, or health care provider.  2014, [employer]. (2012-10-06 12:39:42)    I have prescribed Wellbutrin XL 150 mg 1 tablet daily to treat anxiety, depression and your tobacco addiction.  Take 1 tablet daily for first 3 days then increase to twice a day on day 4 (take the doses 8 hours apart).. Contact me via MyChart to let me know how you are doing.

## 2013-07-29 DIAGNOSIS — F418 Other specified anxiety disorders: Secondary | ICD-10-CM | POA: Insufficient documentation

## 2013-07-29 HISTORY — DX: Other specified anxiety disorders: F41.8

## 2013-07-29 NOTE — Progress Notes (Signed)
S:  This 48 y.o. Cauc female is here today requesting medication for smoking cessation. She has tried to quit unsuccessfully; current family stressors are exacerbating her anxiety which triggers need to smoke. She has smoked for >15 years. Last attempt to quit: 2012. Pt wants to try Chantix. Pt is a diabetic whose care is managed by Dr. Cruzita Lederer. Pt understands consequences of continued tobacco use.   Major family stressors include her mother's current hospitalization w/ chronic psychiatric illness. Mother has been ill most of pt's life, with a suicide attempt when pt was young. Mother lives in assisted living for several years; pt had to remove herself from the decision process re: mother's care and has guilt about this decision. It has created so much stress for pt that she was unable to cope with her own family issues and her well being. Also, pt's sister's children are in her care; sister in hospital w/ a stroke. Lots of financial pressures with this situation; husband just lost his job. She has been having "panic" episodes for last 2 weeks. Sleep disturbance w/ early awakening.  Family hx of mental illness is significant; pt's brother committed suicide.  Patient Active Problem List   Diagnosis Date Noted  . Stressful life event affecting family 07/26/2013  . History of migraine headaches 05/17/2012  . Obesity, unspecified 05/17/2012  . Dysphagia, unspecified 10/06/2010  . Diabetes mellitus type 2 with complications, uncontrolled 10/06/2010  . Esophageal reflux 10/06/2010   Prior to Admission medications   Medication Sig Start Date End Date Taking? Authorizing Provider  aspirin EC 81 MG tablet Take 81 mg by mouth daily.   Yes Historical Provider, MD  Canagliflozin (INVOKANA) 300 MG TABS Take 300 mg in am by mouth 07/17/13  Yes Philemon Kingdom, MD  cyanocobalamin (,VITAMIN B-12,) 1000 MCG/ML injection Inject cyanocobalamin 1051mcg/ 8ml once weekly for 4 weeks then once monthly 06/13/12  Yes  Vikram R Penumalli, MD  Exenatide ER (BYDUREON) 2 MG PEN Inject 2 mg into the skin once a week. 07/26/13  Yes Philemon Kingdom, MD  gabapentin (NEURONTIN) 300 MG capsule Take 1 capsule (300 mg total) by mouth at bedtime. 06/13/12  Yes Penni Bombard, MD  insulin glargine (LANTUS) 100 UNIT/ML injection Inject 20 Units into the skin at bedtime. 07/17/13  Yes Philemon Kingdom, MD  lisinopril (PRINIVIL,ZESTRIL) 20 MG tablet Take 1 tablet (20 mg total) by mouth daily. 05/17/12  Yes Barton Fanny, MD  pantoprazole (PROTONIX) 40 MG tablet Take 1 tablet (40 mg total) by mouth daily.   Yes Barton Fanny, MD  Syringe/Needle, Disp, (SYRINGE 3CC/25GX1") 25G X 1" 3 ML MISC To use with Vitamin B 12 injections. 06/13/12  Yes Penni Bombard, MD         simvastatin (ZOCOR) 40 MG tablet Take 1 tablet (40 mg total) by mouth at bedtime. 05/20/12   Barton Fanny, MD   PMHx, Surg Hx, Soc and Fam Hx reviewed.  ROS: As per HPI.  O: Filed Vitals:   07/26/13 1413  BP: 145/85  Pulse: 95  Temp: 97.9 F (36.6 C)  Resp: 16   GEN: In NAD; WN,WD. Mildly distressed. HENT: [place]/AT; EOMI w/ clear conj/sclerae. Wears corrective lenses.Otherwise unremarkable. COR: RRR. Increased heart rate. LUNGS: Normal resp rate and effort. SKIN: W&D; intact w/o diaphoresis, erythema or pallor. NEURO: A&O x 3; CNs intact. Nonfocal. PSYCH: Tearful and anxious; pt is not angry but is slightly depressed. Conversant and attentive w/ good eye contact. Speech is not pressured  or tangential. Thought content is normal. Cognitive function is normal. Judgement is sound.  A/P: Nicotine addiction- Trial Bupropion due to need to treat mental health issue concurrently.  Depression with anxiety- Bupropion XL 150 mg 1 tablet daily for 3 days then increase dose to 1 tablet bid; pt states she takes Gabapentin occasionally at bedtime.  Stress due to illness of family member- Pt would benefit from counseling but she cannot afford it  and does not have time to go.  Family history of depression  Meds ordered this encounter  Medications  . buPROPion (WELLBUTRIN XL) 150 MG 24 hr tablet    Sig: Take 1 tablet by mouth daily for 3 days then increase to 1 tablet twice daily (dose 8 hours apart).    Dispense:  60 tablet    Refill:  5  . pantoprazole (PROTONIX) 40 MG tablet    Sig: Take 1 tablet (40 mg total) by mouth daily.    Dispense:  30 tablet    Refill:  5

## 2013-07-31 HISTORY — PX: WISDOM TOOTH EXTRACTION: SHX21

## 2013-08-16 ENCOUNTER — Ambulatory Visit: Payer: 59 | Admitting: Internal Medicine

## 2013-09-04 ENCOUNTER — Other Ambulatory Visit: Payer: Self-pay | Admitting: *Deleted

## 2013-09-04 ENCOUNTER — Encounter: Payer: Self-pay | Admitting: Internal Medicine

## 2013-09-04 ENCOUNTER — Ambulatory Visit (INDEPENDENT_AMBULATORY_CARE_PROVIDER_SITE_OTHER): Payer: 59 | Admitting: Internal Medicine

## 2013-09-04 VITALS — BP 114/78 | HR 95 | Temp 98.4°F | Resp 12 | Wt 216.0 lb

## 2013-09-04 DIAGNOSIS — IMO0002 Reserved for concepts with insufficient information to code with codable children: Secondary | ICD-10-CM

## 2013-09-04 DIAGNOSIS — E663 Overweight: Secondary | ICD-10-CM

## 2013-09-04 DIAGNOSIS — E118 Type 2 diabetes mellitus with unspecified complications: Principal | ICD-10-CM

## 2013-09-04 DIAGNOSIS — E1165 Type 2 diabetes mellitus with hyperglycemia: Secondary | ICD-10-CM

## 2013-09-04 LAB — BASIC METABOLIC PANEL
BUN: 9 mg/dL (ref 6–23)
CALCIUM: 9.5 mg/dL (ref 8.4–10.5)
CO2: 26 meq/L (ref 19–32)
Chloride: 104 mEq/L (ref 96–112)
Creatinine, Ser: 0.7 mg/dL (ref 0.4–1.2)
GFR: 100.7 mL/min (ref >60.00)
Glucose, Bld: 148 mg/dL — ABNORMAL HIGH (ref 70–99)
Potassium: 3.9 mEq/L (ref 3.5–5.1)
SODIUM: 138 meq/L (ref 135–145)

## 2013-09-04 LAB — BRAIN NATRIURETIC PEPTIDE: Pro B Natriuretic peptide (BNP): 43 pg/mL (ref 0.0–100.0)

## 2013-09-04 MED ORDER — ONETOUCH ULTRASOFT LANCETS MISC: Status: DC

## 2013-09-04 MED ORDER — GLUCOSE BLOOD VI STRP: ORAL_STRIP | Status: DC

## 2013-09-04 NOTE — Patient Instructions (Addendum)
Please stop at the lab. Continue: - Lantus 20 units at 5:30 pm  - Invokana 300 mg daily - Bydureon 2 mg weekly Please return in 2 months with your sugar log.

## 2013-09-04 NOTE — Progress Notes (Signed)
Patient ID: Shelley Garcia, female   DOB: [birth year], 48 y.o.   MRN: [id number]  HPI: Shelley Garcia is a 48 y.o.-year-old female, returning for f/u for DM2, dx 2010, insulin-dependent since 2011, uncontrolled, with complications (Cerebrovascular ds). Last visit 1.5 mo ago.  Last hemoglobin A1c was: 07/2013 (before 07/31/2013): 8.8% Lab Results  Component Value Date   HGBA1C 10.1 05/17/2012  She tells me HbA1c was always around 10%.  Pt is on a regimen of: - Lantus 20 << 25 << 20 units at 5:30 pm (works nights) - does not miss it - Invokana 100 mg >> 300 - started 06/2013 - Bydureon 2 mg - started 06/2013 Tried Metformin >> N/V/D/AP. Does not want to retry, not even XR. Stopped Glimepiride 4 mg daily.  Pt checks her sugars  - not recently - but after the surgery - all <180. She also feels better. Reviewed sugars from last time: - 4 pm, fasting >> 200s  No lows. Lowest sugar was 112; ? she has hypoglycemia awareness (160s >> feels bad) Highest sugar was 400s >> 180.   Pt's meals are: - Breakfast: coffee - Lunch (midnight): sandwich or salad - Dinner: protein + veggie + starch - Snacks: 2x She is going to the [facility].  - no CKD, last BUN/creatinine:  Lab Results  Component Value Date   BUN 16 05/27/2012   CREATININE 0.69 05/27/2012  On Lisinopril.  - last set of lipids: Lab Results  Component Value Date   CHOL 272* 05/17/2012   HDL 38* 05/17/2012   LDLCALC 165* 05/17/2012   TRIG 343* 05/17/2012   CHOLHDL 7.2 05/17/2012  She stopped Zocor 8 mo ago b/c fatigue, weakness, fatigue. - last eye exam was in ~2 years. No DR.  - no numbness and tingling in her feet.  She has dental abscesses >> had surgery for extractions 07/31/2013.   She quit smoking.  I reviewed pt's medications, allergies, PMH, social hx, family hx and no changes required, except as mentioned above.  ROS: Constitutional: + weight gain, no fatigue, no subjective hyperthermia/hypothermia Eyes: no  blurry vision, no xerophthalmia ENT: no sore throat, no nodules palpated in throat, no dysphagia/odynophagia, no hoarseness, + decreased hearing Cardiovascular: no CP/SOB/+ palpitations/no leg swelling Respiratory: no cough/SOB Gastrointestinal: + N/no V/D/C, + heartburn Musculoskeletal: no muscle/+ joint aches Skin: no rashes, + hair loss Neurological: no tremors/numbness/tingling/dizziness, + HA  PE: BP 114/78  Pulse 95  Temp(Src) 98.4 F (36.9 C) (Oral)  Resp 12  Wt 216 lb (97.977 kg)  SpO2 96% Wt Readings from Last 3 Encounters:  09/04/13 216 lb (97.977 kg)  07/26/13 222 lb (100.699 kg)  07/17/13 226 lb (102.513 kg)   Constitutional: overweight, in NAD Eyes: PERRLA, EOMI, no exophthalmos ENT: moist mucous membranes, no thyromegaly, no cervical lymphadenopathy Cardiovascular: RRR, No MRG Respiratory: CTA B Gastrointestinal: abdomen soft, NT, ND, BS+ Musculoskeletal: no deformities, strength intact in all 4 Skin: moist, warm, no rashes Neurological: no tremor with outstretched hands, DTR normal in all 4  ASSESSMENT: 1. DM2, insulin-dependent, uncontrolled, without complications - Cerebrovascular ds. ("ministrokes") - MRI not in the system (was seen by neurology) - per pt  2. Overweight  PLAN:  1. Patient with 5 year h/o uncontrolled diabetes, now with improved control on basal insulin + Invokana + Bydureon. She lost 10 lbs since last visit and feeling very well. - We discussed about options for treatment, and I suggested to:    Patient Instructions  Please stop at the  lab. Continue: - Lantus 20 units at 5:30 pm  - Invokana 300 mg daily - Bydureon 2 mg weekly Please return in 2 months with your sugar log.  - continue checking sugars at different times of the day - check 1-2 times a day, rotating checks - given sugar log and advised how to fill it and to bring it at next appt  - advised for yearly eye exams >> she is due - refilled her strips and lancets - will  check potassium and BUN/Cr since on Invokana and Lisinopril - Return to clinic in 2 mo with sugar log >> will check Hba1c at next visit  2. Overweight - lost 10 lbs on Invokana + Bydureon since last visit - continue above doses of the 2 meds  Office Visit on 09/04/2013  Component Date Value Ref Range Status  . Sodium 09/04/2013 138  135 - 145 mEq/L Final  . Potassium 09/04/2013 3.9  3.5 - 5.1 mEq/L Final  . Chloride 09/04/2013 104  96 - 112 mEq/L Final  . CO2 09/04/2013 26  19 - 32 mEq/L Final  . Glucose, Bld 09/04/2013 148* 70 - 99 mg/dL Final  . BUN 09/04/2013 9  6 - 23 mg/dL Final  . Creatinine, Ser 09/04/2013 0.7  0.4 - 1.2 mg/dL Final  . Calcium 09/04/2013 9.5  8.4 - 10.5 mg/dL Final  . GFR 09/04/2013 100.70  >60.00 mL/min Final   The potassium and kidney function are normal.The potassium and kidney function are normal.

## 2013-10-25 ENCOUNTER — Ambulatory Visit: Payer: 59 | Admitting: Family Medicine

## 2014-01-08 ENCOUNTER — Ambulatory Visit: Payer: 59 | Admitting: Internal Medicine

## 2014-02-06 ENCOUNTER — Ambulatory Visit (INDEPENDENT_AMBULATORY_CARE_PROVIDER_SITE_OTHER): Payer: 59 | Admitting: Internal Medicine

## 2014-02-06 ENCOUNTER — Encounter: Payer: Self-pay | Admitting: Internal Medicine

## 2014-02-06 VITALS — BP 112/92 | HR 88 | Temp 99.0°F | Resp 12 | Wt 219.0 lb

## 2014-02-06 DIAGNOSIS — E1165 Type 2 diabetes mellitus with hyperglycemia: Secondary | ICD-10-CM

## 2014-02-06 DIAGNOSIS — E118 Type 2 diabetes mellitus with unspecified complications: Secondary | ICD-10-CM

## 2014-02-06 DIAGNOSIS — IMO0002 Reserved for concepts with insufficient information to code with codable children: Secondary | ICD-10-CM

## 2014-02-06 DIAGNOSIS — E785 Hyperlipidemia, unspecified: Secondary | ICD-10-CM

## 2014-02-06 LAB — HEMOGLOBIN A1C: Hgb A1c MFr Bld: 8.8 % — ABNORMAL HIGH (ref 4.6–6.5)

## 2014-02-06 LAB — LIPID PANEL
CHOLESTEROL: 282 mg/dL — AB (ref 0–200)
HDL: 33.3 mg/dL — ABNORMAL LOW (ref >39.00)
NonHDL: 248.7
TRIGLYCERIDES: 315 mg/dL — AB (ref 0.0–149.0)
Total CHOL/HDL Ratio: 8
VLDL: 63 mg/dL — ABNORMAL HIGH (ref 0.0–40.0)

## 2014-02-06 MED ORDER — GLIPIZIDE 10 MG PO TABS: 10.0000 mg | ORAL_TABLET | Freq: Two times a day (BID) | ORAL | Status: DC

## 2014-02-06 MED ORDER — SITAGLIPTIN PHOSPHATE 100 MG PO TABS: 100.0000 mg | ORAL_TABLET | Freq: Every day | ORAL | Status: DC

## 2014-02-06 NOTE — Progress Notes (Addendum)
Patient ID: Shelley Garcia, female   DOB: [birth year], 48 y.o.   MRN: [id number]  HPI: Shelley Garcia is a 48 y.o.-year-old female, returning for f/u for DM2, dx 2010, insulin-dependent since 2011, uncontrolled, with complications (Cerebrovascular ds). Last visit 33mo ago.  Last hemoglobin A1c was: 07/2013 (before 07/31/2013): 8.8% Lab Results  Component Value Date   HGBA1C 10.1 05/17/2012  She tells me HbA1c was always around 10%.  Pt is on a regimen of: - Lantus 20 units at 5:30 pm (works nights) - does not miss it - Invokana 100 mg >> 300 - started 06/2013 >> yeast infections x6 >> stopped 1 mo ago - Bydureon 2 mg - started 06/2013 >> itching at the inj site and rash on face >> stopped 1 mo ago Tried Metformin >> N/V/D/AP. Does not want to retry, not even XR. Stopped Glimepiride 4 mg daily.  Pt checks her sugars 0-1x a day - high sugars after stopping the Invokana and Bydureon: - am: 230-240 - later: 280s  No lows. Lowest sugar was 112 >> low 200s; ? she has hypoglycemia awareness (160s >> feels bad) Highest sugar was 400s >> 180 >> 303.   Pt's meals are: - Breakfast: coffee - Lunch (midnight): sandwich or salad - Dinner: protein + veggie + starch - Snacks: 2x She is going to the [facility].  - no CKD, last BUN/creatinine:  Lab Results  Component Value Date   BUN 9 09/04/2013   CREATININE 0.7 09/04/2013  On Lisinopril.  - last set of lipids: Lab Results  Component Value Date   CHOL 272* 05/17/2012   HDL 38* 05/17/2012   LDLCALC 165* 05/17/2012   TRIG 343* 05/17/2012   CHOLHDL 7.2 05/17/2012  She stopped Zocor b/c fatigue, weakness, fatigue. - last eye exam was in ~2 years. No DR.  - no numbness and tingling in her feet.  She has dental abscesses >> had surgery for extractions 07/31/2013.   She quit smoking.  I reviewed pt's medications, allergies, PMH, social hx, family hx and no changes required, except as mentioned above.  ROS: Constitutional: no  weight gain, no fatigue, no subjective hyperthermia/hypothermia Eyes: no blurry vision, no xerophthalmia ENT: no sore throat, no nodules palpated in throat, no dysphagia/odynophagia, no hoarseness, + decreased hearing Cardiovascular: no CP/SOB/palpitations/no leg swelling Respiratory: no cough/SOB Gastrointestinal: no N/V/D/C/heartburn Musculoskeletal: no muscle/joint aches Skin: no rashes, + itching and rash Neurological: no tremors/numbness/tingling/dizziness  PE: BP 112/92 mmHg  Pulse 88  Temp(Src) 99 F (37.2 C) (Oral)  Resp 12  Wt 219 lb (99.338 kg)  SpO2 96% Wt Readings from Last 3 Encounters:  02/06/14 219 lb (99.338 kg)  09/04/13 216 lb (97.977 kg)  07/26/13 222 lb (100.699 kg)   Constitutional: overweight, in NAD Eyes: PERRLA, EOMI, no exophthalmos ENT: moist mucous membranes, no thyromegaly, no cervical lymphadenopathy Cardiovascular: RRR, No MRG Respiratory: CTA B Gastrointestinal: abdomen soft, NT, ND, BS+ Musculoskeletal: no deformities, strength intact in all 4 Skin: moist, warm, no rashes Neurological: no tremor with outstretched hands, DTR normal in all 4  ASSESSMENT: 1. DM2, insulin-dependent, uncontrolled, without complications - Cerebrovascular ds. ("ministrokes") - MRI not in the system (was seen by neurology) - per pt  2. HL  PLAN:  1. Patient with 5 year h/o uncontrolled diabetes, now with much worse control on basal insulin only.  - We discussed about options for treatment, and I suggested to:    Patient Instructions  Please increase Lantus to 25 units x 5 days  and increase further to 30 units if sugars not <150 in am Add Glipizide 10 mg before your 2 meals of the day. Start Januvia 100 mg before first meal of the day.  Please return in 1.5 months with your sugar log.   Please stop at the lab.  - continue checking sugars at different times of the day - check 1-2 times a day, rotating checks - advised for yearly eye exams >> she is due! -  will check HbA1c today - Return to clinic in 1.5 mo with sugar log   2. HL - reviewed prev Lipid panel - discussed possibility to start Crestor once a week as she had SEs in the past from statins >> she agrees to try - check Lipids first  Office Visit on 02/06/2014  Component Date Value Ref Range Status  . Cholesterol 02/06/2014 282* 0 - 200 mg/dL Final   ATP III Classification       Desirable:  < 200 mg/dL               Borderline High:  200 - 239 mg/dL          High:  > = 240 mg/dL  . Triglycerides 02/06/2014 315.0* 0.0 - 149.0 mg/dL Final   Normal:  <150 mg/dLBorderline High:  150 - 199 mg/dL  . HDL 02/06/2014 33.30* >39.00 mg/dL Final  . VLDL 02/06/2014 63.0* 0.0 - 40.0 mg/dL Final  . Total CHOL/HDL Ratio 02/06/2014 8   Final                  Men          Women1/2 Average Risk     3.4          3.3Average Risk          5.0          4.42X Average Risk          9.6          7.13X Average Risk          15.0          11.0                      . NonHDL 02/06/2014 248.70   Final   NOTE:  Non-HDL goal should be 30 mg/dL higher than patient's LDL goal (i.e. LDL goal of < 70 mg/dL, would have non-HDL goal of < 100 mg/dL)  . Hgb A1c MFr Bld 02/06/2014 8.8* 4.6 - 6.5 % Final   Glycemic Control Guidelines for People with Diabetes:Non Diabetic:  <6%Goal of Therapy: <7%Additional Action Suggested:  >8%   . Direct LDL 02/06/2014 201.4   Final   Optimal:  <100 mg/dLNear or Above Optimal:  100-129 mg/dLBorderline High:  130-159 mg/dLHigh:  160-189 mg/dLVery High:  >190 mg/dL   Hemoglobin A1c Stable, at 8.8%. LDL very high, I sent Crestor 5 mg to her pharmacy - start by taking it once a week. We'll reevaluate her when she comes back. If she can tolerate this dose, I plan to increase the dose to 10 and possibly even 20 mg. I will also try to move the doses closer together.

## 2014-02-06 NOTE — Patient Instructions (Signed)
Please increase Lantus to 25 units x 5 days and increase further to 30 units if sugars not <150 in am Add Glipizide 10 mg before your 2 meals of the day. Start Januvia 100 mg before first meal of the day.  Please return in 1.5 months with your sugar log.   Please stop at the lab.

## 2014-02-07 LAB — LDL CHOLESTEROL, DIRECT: LDL DIRECT: 201.4 mg/dL

## 2014-02-07 MED ORDER — ROSUVASTATIN CALCIUM 5 MG PO TABS: 5.0000 mg | ORAL_TABLET | Freq: Every day | ORAL | Status: DC

## 2014-03-20 ENCOUNTER — Encounter: Payer: Self-pay | Admitting: Internal Medicine

## 2014-03-20 ENCOUNTER — Ambulatory Visit (INDEPENDENT_AMBULATORY_CARE_PROVIDER_SITE_OTHER): Payer: 59 | Admitting: Internal Medicine

## 2014-03-20 ENCOUNTER — Other Ambulatory Visit: Payer: Self-pay | Admitting: Internal Medicine

## 2014-03-20 VITALS — BP 120/68 | HR 92 | Temp 98.8°F | Resp 12 | Wt 223.8 lb

## 2014-03-20 DIAGNOSIS — IMO0002 Reserved for concepts with insufficient information to code with codable children: Secondary | ICD-10-CM

## 2014-03-20 DIAGNOSIS — E118 Type 2 diabetes mellitus with unspecified complications: Secondary | ICD-10-CM

## 2014-03-20 DIAGNOSIS — E1165 Type 2 diabetes mellitus with hyperglycemia: Secondary | ICD-10-CM

## 2014-03-20 MED ORDER — LIRAGLUTIDE 18 MG/3ML ~~LOC~~ SOPN: PEN_INJECTOR | SUBCUTANEOUS | Status: DC

## 2014-03-20 NOTE — Patient Instructions (Signed)
-   Please increase Lantus to 35 units at bedtime - Continue Glipizide 10 mg before your 2 meals of the day - Continue Januvia 100 mg before first meal of the day until you get to 1.2 mg Victoza daily. - Start Victoza 0.6 mg daily in am and advance as tolerated to 1.2 mg and then to 1.8 mg after 7 days of each dose.  Please come back for a follow-up appointment in 3 months - bring your log.

## 2014-03-20 NOTE — Progress Notes (Signed)
Patient ID: DORTHIA TOUT, female   DOB: 05-29-1965, 48 y.o.   MRN: [id number]  HPI: Shelley Garcia is a 48 y.o.-year-old female, returning for f/u for DM2, dx 2010, insulin-dependent since 2011, uncontrolled, with complications (Cerebrovascular ds). Last visit 1.5 mo ago.  Last hemoglobin A1c was: Lab Results  Component Value Date   HGBA1C 8.8* 02/06/2014   HGBA1C 10.1 05/17/2012  07/2013 (before 07/31/2013): 8.8% She tells me HbA1c was always around 10%.  Pt was on a regimen of: - Lantus 20 units at 5:30 pm (works nights) - does not miss it - Invokana 100 mg >> 300 - started 06/2013 >> yeast infections x6 >> stopped 1 mo ago - Bydureon 2 mg - started 06/2013 >> itching at the inj site and rash on face >> stopped 1 mo ago Tried Metformin >> N/V/D/AP. Does not want to retry, not even XR. Stopped Glimepiride 4 mg daily.  She is now on: - Lantus 30 units at bedtime - Glipizide 10 mg before your 2 meals of the day - started 01/2014 - Januvia 100 mg before first meal of the day - started 01/2014  Pt checks her sugars 0-1x a day - still high sugars: - fasting: 230-240 >> 180-200 - later: 280s >> 250-300s No lows. Lowest sugar was 112 >> low 200s >> 180; ? she has hypoglycemia awareness (160s >> feels bad) Highest sugar was 400s >> 180 >> 303 >> 300s.   Pt's meals are: - Breakfast: coffee - Lunch (midnight): sandwich or salad - Dinner: protein + veggie + starch - Snacks: 2x She is going to the [facility].  - no CKD, last BUN/creatinine:  Lab Results  Component Value Date   BUN 9 09/04/2013   CREATININE 0.7 09/04/2013  On Lisinopril.  - last set of lipids: Lab Results  Component Value Date   CHOL 282* 02/06/2014   HDL 33.30* 02/06/2014   LDLCALC 165* 05/17/2012   LDLDIRECT 201.4 02/06/2014   TRIG 315.0* 02/06/2014   CHOLHDL 8 02/06/2014  She stopped Zocor b/c fatigue, weakness, fatigue. - last eye exam was  ~2 years ago. No DR.  - no numbness and tingling in  her feet.  She has dental abscesses >> had surgery for extractions 07/31/2013.   I reviewed pt's medications, allergies, PMH, social hx, family hx, and changes were documented in the history of present illness. Otherwise, unchanged from my initial visit note  ROS: Constitutional: no weight gain, no fatigue, no subjective hyperthermia/hypothermia, + increased urination Eyes: no blurry vision, no xerophthalmia ENT: no sore throat, no nodules palpated in throat, no dysphagia/odynophagia, no hoarseness Cardiovascular: no CP/SOB/palpitations/no leg swelling Respiratory: no cough/SOB Gastrointestinal: no N/V/D/C/heartburn Musculoskeletal: no muscle/joint aches, + back pain Skin: no rashes Neurological: no tremors/numbness/tingling/dizziness  PE: BP 120/68 mmHg  Pulse 92  Temp(Src) 98.8 F (37.1 C) (Oral)  Resp 12  Wt 223 lb 12.8 oz (101.515 kg)  SpO2 96% Wt Readings from Last 3 Encounters:  03/20/14 223 lb 12.8 oz (101.515 kg)  02/06/14 219 lb (99.338 kg)  09/04/13 216 lb (97.977 kg)   Constitutional: overweight, in NAD Eyes: PERRLA, EOMI, no exophthalmos ENT: moist mucous membranes, no thyromegaly, no cervical lymphadenopathy Cardiovascular: RRR, No MRG Respiratory: CTA B Gastrointestinal: abdomen soft, NT, ND, BS+ Musculoskeletal: no deformities, strength intact in all 4 Skin: moist, warm, no rashes Neurological: no tremor with outstretched hands, DTR normal in all 4  ASSESSMENT: 1. DM2, insulin-dependent, uncontrolled, without complications - Cerebrovascular ds. ("ministrokes") - MRI not  in the system (was seen by neurology) - per pt  2. HL  PLAN:  1. Patient with 5 year h/o uncontrolled diabetes, now still worse control after stopping Invokana.  - We discussed about options for treatment, and I suggested to retry Victoza >> she agrees:    Patient Instructions   - Please increase Lantus to 35 units at bedtime - Continue Glipizide 10 mg before your 2 meals of the  day - Continue Januvia 100 mg before first meal of the day until you get to 1.2 mg Victoza daily. - Start Victoza 0.6 mg daily in am and advance as tolerated to 1.2 mg and then to 1.8 mg after 7 days of each dose.  Please come back for a follow-up appointment in 3 months - bring your log.   - continue checking sugars at different times of the day - check 1-2 times a day, rotating checks - advised again for yearly eye exams >> she is due! - will check HbA1c at next visit - we discussed about a VGo >> this will be next option - Return to clinic in 3 mo with sugar log   2. HL - reviewed prev Lipid panel - discussed possibility to start Crestor once a week as she had SEs in the past from statins >> she agreed to try at 5 mg once a week >> did not start yet  - advised to start the above dose

## 2014-05-29 ENCOUNTER — Encounter: Payer: Self-pay | Admitting: Internal Medicine

## 2014-06-04 ENCOUNTER — Encounter: Payer: Self-pay | Admitting: Internal Medicine

## 2014-06-13 ENCOUNTER — Encounter: Payer: Self-pay | Admitting: Internal Medicine

## 2014-06-13 LAB — HM DIABETES EYE EXAM

## 2014-06-19 ENCOUNTER — Ambulatory Visit: Payer: 59 | Admitting: Internal Medicine

## 2014-09-23 ENCOUNTER — Other Ambulatory Visit: Payer: Self-pay

## 2014-11-05 ENCOUNTER — Encounter: Payer: Self-pay | Admitting: *Deleted

## 2014-11-12 ENCOUNTER — Other Ambulatory Visit: Payer: Self-pay | Admitting: Family Medicine

## 2015-02-04 ENCOUNTER — Other Ambulatory Visit: Payer: Self-pay | Admitting: *Deleted

## 2015-02-04 NOTE — Patient Outreach (Signed)
Reached Veida on her cell phone to request she schedule a  follow up [facility] visit. Her last visit was March 1,2016. She says she will call back as she was pulling in the polling place to vote. Advised her to call the new office number and told her that the [facility] office has moved to [place]. Await return call. Barrington Ellison RN,CCM,CDE [place] [facility] Coordinator [facility] Office Phone [phone] Office Fax [phone]

## 2015-02-11 ENCOUNTER — Encounter: Payer: Self-pay | Admitting: Behavioral Health

## 2015-02-11 ENCOUNTER — Telehealth: Payer: Self-pay | Admitting: Behavioral Health

## 2015-02-11 NOTE — Telephone Encounter (Signed)
Pre-Visit Call completed with patient and chart updated.   Pre-Visit Info documented in Specialty Comments under [employer].    

## 2015-02-12 ENCOUNTER — Other Ambulatory Visit (INDEPENDENT_AMBULATORY_CARE_PROVIDER_SITE_OTHER): Payer: 59

## 2015-02-12 ENCOUNTER — Ambulatory Visit (INDEPENDENT_AMBULATORY_CARE_PROVIDER_SITE_OTHER): Payer: 59 | Admitting: Physician Assistant

## 2015-02-12 ENCOUNTER — Encounter: Payer: Self-pay | Admitting: Physician Assistant

## 2015-02-12 VITALS — BP 130/86 | HR 85 | Temp 98.0°F | Resp 16 | Ht 64.0 in | Wt 224.5 lb

## 2015-02-12 DIAGNOSIS — E559 Vitamin D deficiency, unspecified: Secondary | ICD-10-CM | POA: Insufficient documentation

## 2015-02-12 DIAGNOSIS — E118 Type 2 diabetes mellitus with unspecified complications: Secondary | ICD-10-CM

## 2015-02-12 DIAGNOSIS — Z23 Encounter for immunization: Secondary | ICD-10-CM | POA: Diagnosis not present

## 2015-02-12 DIAGNOSIS — E1165 Type 2 diabetes mellitus with hyperglycemia: Secondary | ICD-10-CM | POA: Diagnosis not present

## 2015-02-12 DIAGNOSIS — G320 Subacute combined degeneration of spinal cord in diseases classified elsewhere: Secondary | ICD-10-CM

## 2015-02-12 DIAGNOSIS — E538 Deficiency of other specified B group vitamins: Secondary | ICD-10-CM | POA: Diagnosis not present

## 2015-02-12 DIAGNOSIS — S46811A Strain of other muscles, fascia and tendons at shoulder and upper arm level, right arm, initial encounter: Secondary | ICD-10-CM | POA: Diagnosis not present

## 2015-02-12 DIAGNOSIS — S46819A Strain of other muscles, fascia and tendons at shoulder and upper arm level, unspecified arm, initial encounter: Secondary | ICD-10-CM | POA: Insufficient documentation

## 2015-02-12 DIAGNOSIS — IMO0002 Reserved for concepts with insufficient information to code with codable children: Secondary | ICD-10-CM

## 2015-02-12 DIAGNOSIS — Z794 Long term (current) use of insulin: Secondary | ICD-10-CM | POA: Diagnosis not present

## 2015-02-12 HISTORY — DX: Strain of other muscles, fascia and tendons at shoulder and upper arm level, unspecified arm, initial encounter: S46.819A

## 2015-02-12 HISTORY — DX: Deficiency of other specified B group vitamins: E53.8

## 2015-02-12 LAB — BASIC METABOLIC PANEL
BUN: 10 mg/dL (ref 6–23)
CALCIUM: 8.9 mg/dL (ref 8.4–10.5)
CO2: 24 mEq/L (ref 19–32)
Chloride: 103 mEq/L (ref 96–112)
Creatinine, Ser: 0.68 mg/dL (ref 0.40–1.20)
GFR: 98.38 mL/min (ref >60.00)
Glucose, Bld: 320 mg/dL — ABNORMAL HIGH (ref 70–99)
Potassium: 3.5 mEq/L (ref 3.5–5.1)
SODIUM: 136 meq/L (ref 135–145)

## 2015-02-12 LAB — HEMOGLOBIN A1C: HEMOGLOBIN A1C: 10.8 % — AB (ref 4.6–6.5)

## 2015-02-12 MED ORDER — NAPROXEN 500 MG PO TABS: 500.0000 mg | ORAL_TABLET | Freq: Two times a day (BID) | ORAL | Status: DC

## 2015-02-12 MED ORDER — RAMIPRIL 2.5 MG PO CAPS: 2.5000 mg | ORAL_CAPSULE | Freq: Every day | ORAL | Status: DC

## 2015-02-12 MED ORDER — CYCLOBENZAPRINE HCL 10 MG PO TABS: 10.0000 mg | ORAL_TABLET | Freq: Three times a day (TID) | ORAL | Status: DC | PRN

## 2015-02-12 MED ORDER — EXENATIDE ER 2 MG ~~LOC~~ PEN: 2.0000 mg | PEN_INJECTOR | SUBCUTANEOUS | Status: DC

## 2015-02-12 MED ORDER — PITAVASTATIN CALCIUM 4 MG PO TABS: 4.0000 mg | ORAL_TABLET | Freq: Every day | ORAL | Status: DC

## 2015-02-12 NOTE — Assessment & Plan Note (Addendum)
Will check vitamin D level today.  

## 2015-02-12 NOTE — Progress Notes (Signed)
Patient presents to clinic today to establish care.  Acute Concerns: Patient complains of right-sided neck pain over the past week. States she feels she laid on it wrong. Denies trauma or injury. Has not taken anything for symptoms.   Chronic Issues: Hypertension -- Previously on lisinopril. Has been off of the medication over the past year as BP was getting lower. Patient had lost weight which she thinks had an effect on blood pressure.  Patient denies chest pain, palpitations, lightheadedness, dizziness, vision changes or frequent headaches.  BP Readings from Last 3 Encounters:  02/12/15 130/86  03/20/14 120/68  02/06/14 112/92   Diabetes Mellitus -- previously uncontrolled. Patient was previously followed by endocrinology. Has not seen in one year. Last A1C in system on 02/06/2014 at 8.8. Patient is currently on Lantus 30 units every afternoon and Victoza 1.8 mg daily. Patient states she did better on Bydureon weekly and would like to switch back to this medication instead of the Victoza. She would also like to discuss attempting to restart in the clinic. Patient states she was previously on this medication with good improvement in sugars. States she had to stop the 300 mg dosing due to yeast infections.  Patient denies history of neuropathy, nephropathy or retinopathy.    Past Medical History  Diagnosis Date  . Hypertension   . Diabetes mellitus   . GERD (gastroesophageal reflux disease)   . Hyperlipidemia   . Obesity   . History of chicken pox   . Vitamin B12 deficiency   . Vitamin D deficiency     Past Surgical History  Procedure Laterality Date  . Cholecystectomy    . Kidney stone surgery    . Exploratory laparotomy    . Wisdom tooth extraction  07/31/2013    Current Outpatient Prescriptions on File Prior to Visit  Medication Sig Dispense Refill  . aspirin EC 81 MG tablet Take 81 mg by mouth daily.    Marland Kitchen gabapentin (NEURONTIN) 300 MG capsule Take 1 capsule (300 mg  total) by mouth at bedtime. 30 capsule 6  . glucose blood test strip Check 2x a day 100 each 11  . insulin glargine (LANTUS) 100 UNIT/ML injection Inject 30 Units into the skin at bedtime.     . Lancets (ONETOUCH ULTRASOFT) lancets Check 2x a day 100 each 11  . pantoprazole (PROTONIX) 40 MG tablet Take 1 tablet (40 mg total) by mouth daily. 30 tablet 5  . Syringe/Needle, Disp, (SYRINGE 3CC/25GX1") 25G X 1" 3 ML MISC To use with Vitamin B 12 injections. 50 each 0  . cyanocobalamin (,VITAMIN B-12,) 1000 MCG/ML injection Inject cyanocobalamin 1016mcg/ 84ml once weekly for 4 weeks then once monthly (Patient not taking: Reported on 02/11/2015) 4 mL 11  . rosuvastatin (CRESTOR) 5 MG tablet Take 1 tablet (5 mg total) by mouth at bedtime. (Patient not taking: Reported on 02/11/2015) 30 tablet 3   No current facility-administered medications on file prior to visit.    Allergies  Allergen Reactions  . Metformin And Related     Made pt really sick  . Codeine Rash    Family History  Problem Relation Age of Onset  . Hypertension Father   . Diabetes Father   . Lung cancer Father   . Hypertension Mother 24    Deceased  . Diabetes Mother   . Mental illness Mother     Schizophrenia  . Stroke Mother   . Diabetes Sister     Deceased  . Stroke Sister   .  Mental illness Brother     Deceased  . Bipolar disorder Mother   . Kidney failure Father     Living  . Colon cancer Paternal Grandfather   . Diabetes Other     Maternal/Paternal side  . Hypertension Other     Maternal/Paternal side  . Multiple sclerosis Maternal Aunt   . Hypertension Sister   . Healthy Son     x2    Social History   Social History  . Marital Status: Married    Spouse Name: N/A  . Number of Children: 2  . Years of Education: N/A   Occupational History  . Nurse [employer]   Social History Main Topics  . Smoking status: Former Smoker -- 1.00 packs/day    Types: Cigarettes    Start date: 06/11/2014  .  Smokeless tobacco: Never Used     Comment: pt doing the vapor cigerette   . Alcohol Use: 0.0 oz/week    0 Standard drinks or equivalent per week     Comment: rarely  . Drug Use: No  . Sexual Activity: Not on file   Other Topics Concern  . Not on file   Social History Narrative   Review of Systems  Constitutional: Negative for fever and weight loss.  HENT: Negative for ear discharge, ear pain, hearing loss and tinnitus.   Eyes: Negative for blurred vision, double vision, photophobia and pain.  Respiratory: Negative for cough and shortness of breath.   Cardiovascular: Negative for chest pain and palpitations.  Gastrointestinal: Negative for heartburn, nausea, vomiting, abdominal pain, diarrhea, constipation, blood in stool and melena.  Genitourinary: Negative for dysuria, urgency, frequency, hematuria and flank pain.  Musculoskeletal: Positive for myalgias. Negative for falls.  Neurological: Negative for dizziness, loss of consciousness and headaches.  Endo/Heme/Allergies: Negative for environmental allergies.  Psychiatric/Behavioral: Negative for depression, suicidal ideas, hallucinations and substance abuse. The patient is not nervous/anxious and does not have insomnia.     BP 130/86 mmHg  Pulse 85  Temp(Src) 98 F (36.7 C) (Oral)  Resp 16  Ht 5\' 4"  (1.626 m)  Wt 224 lb 8 oz (101.833 kg)  BMI 38.52 kg/m2  LMP 01/29/2015  Physical Exam  Constitutional: She is oriented to person, place, and time and well-developed, well-nourished, and in no distress.  HENT:  Head: Normocephalic and atraumatic.  Eyes: Conjunctivae are normal. Pupils are equal, round, and reactive to light.  Neck: Neck supple. Muscular tenderness present.  Cardiovascular: Normal rate, regular rhythm, normal heart sounds and intact distal pulses.   Pulmonary/Chest: Effort normal and breath sounds normal. No respiratory distress. She has no wheezes. She has no rales. She exhibits no tenderness.    Lymphadenopathy:    She has no cervical adenopathy.  Neurological: She is alert and oriented to person, place, and time.  Skin: Skin is warm and dry. No rash noted.  Psychiatric: Affect normal.  Vitals reviewed.   No results found for this or any previous visit (from the past 2160 hour(s)).  Assessment/Plan: B12 deficiency We'll check B12 level today.  Diabetes mellitus type 2 with complications, uncontrolled Uncontrolled at present. Has not followed up with endocrinology in some time. We'll continue Lantus at current dose.DC Victoza. Will begin Bydureon 2 mg once weekly. We'll likely add on additional agent to titrate up insulin based on A1c results. We'll check A1c and BMP today. Patient started on  ramipril 2.5 mg today for renovascular protection. Will follow-up in one month.  Trapezius strain RICE reviewed. Rx  Flexeril and Naprosyn. Take as directed. Follow-up if symptoms are not improving.  Vitamin D deficiency Will check vitamin D level today.

## 2015-02-12 NOTE — Assessment & Plan Note (Signed)
RICE reviewed. Rx Flexeril and Naprosyn. Take as directed. Follow-up if symptoms are not improving.

## 2015-02-12 NOTE — Assessment & Plan Note (Signed)
We'll check B12 level today.

## 2015-02-12 NOTE — Assessment & Plan Note (Signed)
Uncontrolled at present. Has not followed up with endocrinology in some time. We'll continue Lantus at current dose.DC Victoza. Will begin Bydureon 2 mg once weekly. We'll likely add on additional agent to titrate up insulin based on A1c results. We'll check A1c and BMP today. Patient started on  ramipril 2.5 mg today for renovascular protection. Will follow-up in one month.

## 2015-02-12 NOTE — Addendum Note (Signed)
Addended by: Rockwell Germany on: 02/12/2015 03:19 PM   Modules accepted: Orders

## 2015-02-12 NOTE — Progress Notes (Signed)
Pre visit review using our clinic review tool, if applicable. No additional management support is needed unless otherwise documented below in the visit note/SLS  

## 2015-02-12 NOTE — Patient Instructions (Addendum)
Please go to the lab for blood work. I will call you with your results.  Start the Bydureon instead of the Victoza. Take once weekly as directed. Continue insulin as directed.  We will decide on restarting Invokana based on A!C results.  Increase aerobic exercise and watch your carb intake. Make sure to get good protein (non-fried) in your diet.  Start the lisinopril as directed.  For the neck, use muscle relaxant and Naprosyn as directed. Avoid heavy lifting or overexertion. Continue heating pad. Call or return to clinic if symptoms are not resolving.

## 2015-02-13 LAB — VITAMIN D 25 HYDROXY (VIT D DEFICIENCY, FRACTURES): VITD: 8.3 ng/mL — ABNORMAL LOW (ref 30.00–100.00)

## 2015-02-13 LAB — VITAMIN B12: VITAMIN B 12: 179 pg/mL — AB (ref 211–911)

## 2015-02-18 ENCOUNTER — Telehealth: Payer: Self-pay | Admitting: *Deleted

## 2015-02-18 MED ORDER — SITAGLIPTIN PHOSPHATE 100 MG PO TABS: 100.0000 mg | ORAL_TABLET | Freq: Every day | ORAL | Status: DC

## 2015-02-18 MED ORDER — CYANOCOBALAMIN 1000 MCG/ML IJ SOLN: 1000.0000 ug | INTRAMUSCULAR | Status: DC

## 2015-02-18 MED ORDER — ERGOCALCIFEROL 1.25 MG (50000 UT) PO CAPS: 50000.0000 [IU] | ORAL_CAPSULE | ORAL | Status: DC

## 2015-02-18 NOTE — Telephone Encounter (Signed)
-----   Message from Brunetta Jeans, PA-C sent at 02/16/2015  8:13 PM EST ----- Lab results are in. Vitamin B is low -- can restart once monthly injections if she is willing. Vitamin D also very low. Want to start Ergocalciferol 50,000 units once weekly x 12 weeks. Then 1000 units daily OTC. Ok to send in Rx. A1C has risen as expected. Continue the Bydureon and insulin. We had discussed Inovkana and potential toxic effects on kidney when using in conjunction with Bydureon. I do feel it would be in her best interest for [place] to either increase insulin dosing first or add another oral medication instead to get sugar under control. Please let me know what patient wishes to do.

## 2015-02-18 NOTE — Telephone Encounter (Signed)
Patient informed, understood & agreed to add another oral medication; new Rx[s] sent to pharmacy per provider VO/SLS

## 2015-03-04 ENCOUNTER — Ambulatory Visit: Payer: 59 | Admitting: *Deleted

## 2015-03-11 ENCOUNTER — Other Ambulatory Visit: Payer: Self-pay | Admitting: *Deleted

## 2015-03-11 ENCOUNTER — Encounter: Payer: Self-pay | Admitting: *Deleted

## 2015-03-11 NOTE — Patient Outreach (Addendum)
[facility]) Care Management   03/11/2015  [name] October 19, 1965 [id number]  Shelley Garcia is an 49 y.o. female who presents to the [facility] office for routine Link To Wellness follow up for self management assistance with Type II DM, HTN and hyperlipidemia.  Subjective:  Shelley Garcia says she is frustrated and burned out and tired of dealing with her diabetes. She says every time she checks her blood sugar it is >300 and she does not feel well and has no energy. She wants to go back on Invokana because she saw a big improvement in her blood sugars when she was taking it. She does not want to increase her insulin dose. She says she is also reconsidering bariatric weight loss surgery.  She says she saw her primary care provider in mid November and her Hgb A1C was elevated at 10.8% and her Vitamin B and D levels were low. She says she did not go back to Dr. Cruzita Lederer because she wanted to put her on an Omnipod pump and Shelley Garcia says she doesn't want to take any more insulin. She is looking forward to the birth of her first two grandchildren ( a boy and a girl) in mid January 2017.   Objective:   Review of Systems  Constitutional: Negative.     Physical Exam  Constitutional: She is oriented to person, place, and time. She appears well-developed and well-nourished.  Neurological: She is alert and oriented to person, place, and time.  Skin: Skin is warm and dry.  Psychiatric: She has a normal mood and affect. Her behavior is normal. Judgment and thought content normal.   Filed Weights   03/11/15 1038  Weight: 226 lb 3.2 oz (102.604 kg)   Filed Vitals:   03/11/15 1038  BP: 104/80   Current Medications:   Current Outpatient Prescriptions  Medication Sig Dispense Refill  . cyanocobalamin (,VITAMIN B-12,) 1000 MCG/ML injection Inject 1 mL (1,000 mcg total) into the muscle every 30 (thirty) days. 10 mL 0  . ergocalciferol (VITAMIN D2)  50000 UNITS capsule Take 1 capsule (50,000 Units total) by mouth once a week. 12 capsule 0  . Exenatide ER 2 MG PEN Inject 2 mg into the skin once a week. 4 each 3  . gabapentin (NEURONTIN) 300 MG capsule Take 1 capsule (300 mg total) by mouth at bedtime. 30 capsule 6  . insulin glargine (LANTUS) 100 UNIT/ML injection Inject 30 Units into the skin at bedtime.     . Multiple Vitamin (MULTIVITAMIN) tablet Take 1 tablet by mouth daily.    . naproxen (NAPROSYN) 500 MG tablet Take 1 tablet (500 mg total) by mouth 2 (two) times daily with a meal. 30 tablet 0  . omeprazole (PRILOSEC) 20 MG capsule Take 20 mg by mouth daily.    . Pitavastatin Calcium 4 MG TABS Take 1 tablet (4 mg total) by mouth daily. 30 tablet 1  . ramipril (ALTACE) 2.5 MG capsule Take 1 capsule (2.5 mg total) by mouth daily. 90 capsule 3  . sitaGLIPtin (JANUVIA) 100 MG tablet Take 1 tablet (100 mg total) by mouth daily. 30 tablet 5  . aspirin EC 81 MG tablet Take 81 mg by mouth daily.    . cyclobenzaprine (FLEXERIL) 10 MG tablet Take 1 tablet (10 mg total) by mouth 3 (three) times daily as needed for muscle spasms. (Patient not taking: Reported on 03/11/2015) 30 tablet 0  . glucose blood test strip Check 2x a day  100 each 11  . Lancets (ONETOUCH ULTRASOFT) lancets Check 2x a day 100 each 11  . Syringe/Needle, Disp, (SYRINGE 3CC/25GX1") 25G X 1" 3 ML MISC To use with Vitamin B 12 injections. 50 each 0   No current facility-administered medications for this visit.    Functional Status:   In your present state of health, do you have any difficulty performing the following activities: 03/11/2015  Hearing? N  Vision? N  Difficulty concentrating or making decisions? N  Walking or climbing stairs? N  Dressing or bathing? N  Doing errands, shopping? N    Fall/Depression Screening:    PHQ 2/9 Scores 03/11/2015 07/26/2013  PHQ - 2 Score 2 0  PHQ- 9 Score 9 -    Assessment:   [employer] employee and Link To Wellness member with  Type II DM, HTN and hyperlipidemia. Meeting treatment targets for BP only. Most recent Hgb A1C= 10.8%, last lipid profile on 11/11/5 showed elevated total cholesterol, triglycerides and LDL.  Plan:  [facility] CM Care Plan Problem One        Most Recent Value   Care Plan Problem One  Patient with Type II DM, HTN and hyperlipidemia- not meeting target blood sugars and Hgb A1C, lipid profile abnormal on last check  in Nov 2015,BP meeting treatment targets    Role Documenting the Problem One  Care Management [place] for Problem One  Active   [facility] Long Term Goal (31-90 days)  Patient will show improved glycemic control as evidenced by improved Hgb A1C with at least 50% of CBG meeting target, lipid profile will show improvement at next assessment and BP will continue to meet treatment targets    [facility] Long Term Goal Start Date  03/11/15   Interventions for Problem One Long Term Goal  allowed Shelley Garcia to vent her frustrations with diabetes management and burn out, and discussed medication options by reviewing the ADA general recommendations for Type II DM algorithm,suggested Shelley Garcia speak with a pharmacist about her wish to take Shelley Garcia together and then discuss with her primary care provider, encouraged Shelley Garcia to investigate bariatric surgery options by attending the information session and then discussing with Dr. Hassell Done and her director about medical leave of absence issues since she is part time. asked Shelley Garcia to update this RNCM if her DM treatment regimen changes, will arrange for Shelley Garcia To Wellness follow up after  she sees her primary care provider in February       RNCM to fax today's office visit note to [facility] PA-C. [facility] will meet quarterly and as needed with patient per Link To Wellness program guidelines to assist with Type II DM, HTN and hyperlipidemia self-management and assess patient's progress toward mutually set goals.  Barrington Ellison RN,CCM,CDE [place] Management Coordinator Link To Wellness Office Phone [phone] Office Fax [phone]

## 2015-03-12 ENCOUNTER — Encounter: Payer: Self-pay | Admitting: Physician Assistant

## 2015-03-14 MED ORDER — CANAGLIFLOZIN 100 MG PO TABS: 100.0000 mg | ORAL_TABLET | Freq: Every day | ORAL | Status: DC

## 2015-04-14 ENCOUNTER — Encounter: Payer: Self-pay | Admitting: Physician Assistant

## 2015-04-14 ENCOUNTER — Other Ambulatory Visit: Payer: Self-pay | Admitting: Physician Assistant

## 2015-04-14 DIAGNOSIS — E669 Obesity, unspecified: Secondary | ICD-10-CM

## 2015-04-15 MED ORDER — CANAGLIFLOZIN 300 MG PO TABS: 300.0000 mg | ORAL_TABLET | Freq: Every day | ORAL | Status: DC

## 2015-04-15 MED FILL — INVOKANA 300 MG TABLET: 300 | 30 days supply | Qty: 30 | Fill #0

## 2015-04-16 ENCOUNTER — Other Ambulatory Visit: Payer: Self-pay | Admitting: Physician Assistant

## 2015-04-16 MED FILL — LIVALO 4 MG TABLET: 4 | 30 days supply | Qty: 30 | Fill #0

## 2015-04-16 MED FILL — BYDUREON 2 MG PEN INJECT: 2 | 28 days supply | Qty: 4 | Fill #2

## 2015-05-12 ENCOUNTER — Ambulatory Visit: Payer: 59 | Admitting: Physician Assistant

## 2015-05-19 ENCOUNTER — Ambulatory Visit (INDEPENDENT_AMBULATORY_CARE_PROVIDER_SITE_OTHER): Payer: 59 | Admitting: Physician Assistant

## 2015-05-19 ENCOUNTER — Encounter: Payer: Self-pay | Admitting: Physician Assistant

## 2015-05-19 VITALS — BP 126/78 | HR 90 | Temp 98.2°F | Ht 64.0 in | Wt 224.2 lb

## 2015-05-19 DIAGNOSIS — Z794 Long term (current) use of insulin: Secondary | ICD-10-CM | POA: Diagnosis not present

## 2015-05-19 DIAGNOSIS — E1165 Type 2 diabetes mellitus with hyperglycemia: Secondary | ICD-10-CM | POA: Diagnosis not present

## 2015-05-19 DIAGNOSIS — K219 Gastro-esophageal reflux disease without esophagitis: Secondary | ICD-10-CM

## 2015-05-19 DIAGNOSIS — IMO0002 Reserved for concepts with insufficient information to code with codable children: Secondary | ICD-10-CM

## 2015-05-19 DIAGNOSIS — E118 Type 2 diabetes mellitus with unspecified complications: Secondary | ICD-10-CM | POA: Diagnosis not present

## 2015-05-19 DIAGNOSIS — E785 Hyperlipidemia, unspecified: Secondary | ICD-10-CM

## 2015-05-19 LAB — LIPID PANEL
CHOL/HDL RATIO: 4
CHOLESTEROL: 167 mg/dL (ref 0–200)
HDL: 42.3 mg/dL (ref >39.00)
LDL Cholesterol: 97 mg/dL (ref 0–99)
NonHDL: 124.36
TRIGLYCERIDES: 136 mg/dL (ref 0.0–149.0)
VLDL: 27.2 mg/dL (ref 0.0–40.0)

## 2015-05-19 LAB — COMPREHENSIVE METABOLIC PANEL
ALBUMIN: 4.2 g/dL (ref 3.5–5.2)
ALT: 17 U/L (ref 0–35)
AST: 17 U/L (ref 0–37)
Alkaline Phosphatase: 52 U/L (ref 39–117)
BILIRUBIN TOTAL: 0.3 mg/dL (ref 0.2–1.2)
BUN: 11 mg/dL (ref 6–23)
CALCIUM: 9 mg/dL (ref 8.4–10.5)
CO2: 24 meq/L (ref 19–32)
CREATININE: 0.66 mg/dL (ref 0.40–1.20)
Chloride: 104 mEq/L (ref 96–112)
GFR: 101.71 mL/min (ref >60.00)
Glucose, Bld: 179 mg/dL — ABNORMAL HIGH (ref 70–99)
Potassium: 3.8 mEq/L (ref 3.5–5.1)
Sodium: 137 mEq/L (ref 135–145)
Total Protein: 7.4 g/dL (ref 6.0–8.3)

## 2015-05-19 LAB — HEMOGLOBIN A1C: HEMOGLOBIN A1C: 9.8 % — AB (ref 4.6–6.5)

## 2015-05-19 MED ORDER — EXENATIDE ER 2 MG ~~LOC~~ PEN: 2.0000 mg | PEN_INJECTOR | SUBCUTANEOUS | Status: DC

## 2015-05-19 MED ORDER — PITAVASTATIN CALCIUM 4 MG PO TABS: 1.0000 | ORAL_TABLET | Freq: Every day | ORAL | Status: DC

## 2015-05-19 MED ORDER — PANTOPRAZOLE SODIUM 40 MG PO TBEC: 40.0000 mg | DELAYED_RELEASE_TABLET | Freq: Every day | ORAL | Status: DC

## 2015-05-19 MED ORDER — CANAGLIFLOZIN 300 MG PO TABS: 300.0000 mg | ORAL_TABLET | Freq: Every day | ORAL | Status: DC

## 2015-05-19 MED FILL — PANTOPRAZOLE SOD DR 40 MG T: 40 | 90 days supply | Qty: 90 | Fill #0

## 2015-05-19 MED FILL — LIVALO 4 MG TABLET: 4 | 90 days supply | Qty: 90 | Fill #0

## 2015-05-19 MED FILL — INVOKANA 300 MG TABLET: 300 | 90 days supply | Qty: 90 | Fill #0

## 2015-05-19 MED FILL — BYDUREON 2 MG PEN INJECT: 2 | 28 days supply | Qty: 4 | Fill #3

## 2015-05-19 NOTE — Assessment & Plan Note (Signed)
Will obtain lipid panel and LFT today. Continue statin and ASA.

## 2015-05-19 NOTE — Progress Notes (Signed)
Patient presents to clinic today for follow-up of Hyperlipidemia and Diabetes.  Hyperlipidemia -- Endorses taking Livalo as directed. Denies myalgias. Is due for repeat lipid panel and LFTs. Is taking her 81 mg ASA daily as directed.  Diabetes Mellitus II uncontrolled -- Patient is taking Invokana 300 mg daily, Lantus 35 units daily, and Bydureon 2mg  once weekly. Endorses non fasting sugars > 300. Is not checking fasting sugars as directed. Is due for a foot exam per EMR but declines today.   Past Medical History  Diagnosis Date  . Hypertension   . Diabetes mellitus   . GERD (gastroesophageal reflux disease)   . Hyperlipidemia   . Obesity   . History of chicken pox   . Vitamin B12 deficiency   . Vitamin D deficiency     Current Outpatient Prescriptions on File Prior to Visit  Medication Sig Dispense Refill  . aspirin EC 81 MG tablet Take 81 mg by mouth daily.    . cyanocobalamin (,VITAMIN B-12,) 1000 MCG/ML injection Inject 1 mL (1,000 mcg total) into the muscle every 30 (thirty) days. 10 mL 0  . gabapentin (NEURONTIN) 300 MG capsule Take 1 capsule (300 mg total) by mouth at bedtime. 30 capsule 6  . glucose blood test strip Check 2x a day 100 each 11  . insulin glargine (LANTUS) 100 UNIT/ML injection Inject 30 Units into the skin at bedtime.     . Lancets (ONETOUCH ULTRASOFT) lancets Check 2x a day 100 each 11  . Multiple Vitamin (MULTIVITAMIN) tablet Take 1 tablet by mouth daily.    . ramipril (ALTACE) 2.5 MG capsule Take 1 capsule (2.5 mg total) by mouth daily. 90 capsule 3  . cyclobenzaprine (FLEXERIL) 10 MG tablet Take 1 tablet (10 mg total) by mouth 3 (three) times daily as needed for muscle spasms. (Patient not taking: Reported on 03/11/2015) 30 tablet 0  . naproxen (NAPROSYN) 500 MG tablet Take 1 tablet (500 mg total) by mouth 2 (two) times daily with a meal. (Patient not taking: Reported on 05/19/2015) 30 tablet 0   No current facility-administered medications on file prior  to visit.    Allergies  Allergen Reactions  . Metformin And Related     Made pt really sick  . Codeine Rash    Family History  Problem Relation Age of Onset  . Hypertension Father   . Diabetes Father   . Lung cancer Father   . Kidney failure Father     Living  . Hypertension Mother 52    Deceased  . Diabetes Mother   . Mental illness Mother     Schizophrenia  . Stroke Mother   . Bipolar disorder Mother   . Diabetes Sister     Deceased  . Stroke Sister   . Mental illness Brother     Deceased  . Colon cancer Paternal Grandfather   . Diabetes Other     Maternal/Paternal side  . Hypertension Other     Maternal/Paternal side  . Multiple sclerosis Maternal Aunt   . Hypertension Sister   . Healthy Son     x2    Social History   Social History  . Marital Status: Married    Spouse Name: N/A  . Number of Children: 2  . Years of Education: N/A   Occupational History  . Nurse [employer]   Social History Main Topics  . Smoking status: Former Smoker -- 1.00 packs/day    Types: Cigarettes    Start date: 06/11/2014  .  Smokeless tobacco: Never Used     Comment: pt doing the vapor cigerette   . Alcohol Use: 0.0 oz/week    0 Standard drinks or equivalent per week     Comment: rarely  . Drug Use: No  . Sexual Activity: Not Asked   Other Topics Concern  . None   Social History Narrative   Review of Systems - See HPI.  All other ROS are negative.  BP 126/78 mmHg  Pulse 90  Temp(Src) 98.2 F (36.8 C) (Oral)  Ht 5\' 4"  (1.626 m)  Wt 224 lb 3.2 oz (101.696 kg)  BMI 38.46 kg/m2  SpO2 98%  LMP 05/13/2015  Physical Exam  Constitutional: She is oriented to person, place, and time and well-developed, well-nourished, and in no distress.  HENT:  Head: Normocephalic and atraumatic.  Eyes: Conjunctivae are normal.  Cardiovascular: Normal rate, regular rhythm, normal heart sounds and intact distal pulses.   Pulmonary/Chest: Effort normal and breath sounds normal. No  respiratory distress. She has no wheezes. She has no rales. She exhibits no tenderness.  Neurological: She is alert and oriented to person, place, and time.  Skin: Skin is warm and dry. No rash noted.  Psychiatric: Affect normal.  Vitals reviewed.   No results found for this or any previous visit (from the past 2160 hour(s)).  Assessment/Plan: Diabetes mellitus type 2 with complications, uncontrolled Uncontrolled at present. Will check BMP and A1C to get an accurate assessment of control and patient is only rarely checking sugars (nonfasting only). Will continue Invokana and Bydureon but will switch Lantus for [name] as she is having a hard time begin compliant with insulin due to her ever-changing work schedule. Will start at 40 units daily. Titration schedule given. Diet and exercise reviewed. Attempted foot exam but patient declines today. Will check at follow-up in 1 month.  Hyperlipidemia Will obtain lipid panel and LFT today. Continue statin and ASA.

## 2015-05-19 NOTE — Progress Notes (Signed)
Pre visit review using our clinic review tool, if applicable. No additional management support is needed unless otherwise documented below in the visit note. 

## 2015-05-19 NOTE — Assessment & Plan Note (Signed)
Uncontrolled at present. Will check BMP and A1C to get an accurate assessment of control and patient is only rarely checking sugars (nonfasting only). Will continue Invokana and Bydureon but will switch Lantus for [name] as she is having a hard time begin compliant with insulin due to her ever-changing work schedule. Will start at 40 units daily. Titration schedule given. Diet and exercise reviewed. Attempted foot exam but patient declines today. Will check at follow-up in 1 month.

## 2015-05-19 NOTE — Patient Instructions (Signed)
Please go to the lab for blood work. I will call you with your results.  Please continue chronic medications with the following changes:  (1) Stop the Lantus. Start the [place] at 40 units daily. Increase by 2 units every 4 days up to 50 units -- until a goal fasting sugars of 100-120 is reached.   Please check you fasting sugar every day and write down for me. Again our goal is a fasting sugar of 100-120 and non-fasting < 180. Please increase exercise. Eat a consistent diet lower in carbs.  Follow-up 1 month.

## 2015-05-22 ENCOUNTER — Telehealth: Payer: Self-pay | Admitting: Physician Assistant

## 2015-05-22 MED ORDER — ZOLPIDEM TARTRATE 10 MG PO TABS: ORAL_TABLET | ORAL | Status: DC

## 2015-05-22 MED FILL — ZOLPIDEM TARTRATE 10 MG TAB: 10 | 30 days supply | Qty: 15 | Fill #0

## 2015-05-22 MED FILL — RAMIPRIL 2.5 MG CAPSULE: 2.5 | 90 days supply | Qty: 90 | Fill #1

## 2015-05-22 NOTE — Telephone Encounter (Signed)
Can be reached: [place], [place]  Reason for call: pt called in stating pharmacy did not receive RX for Lorrin Mais that was discussed with [facility] 05/19/15. She also said that she got sample of [place] and wanted to go ahead with that as a replacement for her insulin 50 units/day. Please send in both to above pharmacy.

## 2015-05-22 NOTE — Telephone Encounter (Signed)
Did discuss at visit.  Ok to send in Ambien 10 mg. Take 1/2 tablet QHS. Quantity 15 with 2 refills. Ok to phone in.

## 2015-05-22 NOTE — Telephone Encounter (Signed)
Advise as do not see Ambien on her current list.

## 2015-05-22 NOTE — Telephone Encounter (Signed)
Phone in prescription to [facility] as Provider instructed. Called the patient left a detailed message prescription sent in.

## 2015-05-26 MED ORDER — INSULIN DEGLUDEC 200 UNIT/ML ~~LOC~~ SOPN: 50.0000 [IU] | PEN_INJECTOR | Freq: Every day | SUBCUTANEOUS | Status: DC

## 2015-05-26 NOTE — Addendum Note (Signed)
Addended by: Raiford Noble on: 05/26/2015 08:59 AM   Modules accepted: Orders

## 2015-05-26 NOTE — Telephone Encounter (Signed)
Shelley Garcia from [facility] calling checking on refill of tresiba, one was never received. Please call him back @ [phone]

## 2015-05-26 NOTE — Telephone Encounter (Signed)
Medication has been sent.  

## 2015-06-02 MED FILL — TRESIBA FLEXTOUCH 200 UNITS: 200 | 36 days supply | Qty: 9 | Fill #0

## 2015-06-03 ENCOUNTER — Other Ambulatory Visit: Payer: Self-pay | Admitting: *Deleted

## 2015-06-03 MED ORDER — INSULIN DEGLUDEC 200 UNIT/ML ~~LOC~~ SOPN: 50.0000 [IU] | PEN_INJECTOR | Freq: Every day | SUBCUTANEOUS | Status: DC

## 2015-06-03 NOTE — Telephone Encounter (Signed)
Rx sent to the pharmacy by e-script.//AB/CMA 

## 2015-06-16 ENCOUNTER — Ambulatory Visit (INDEPENDENT_AMBULATORY_CARE_PROVIDER_SITE_OTHER): Payer: 59 | Admitting: Physician Assistant

## 2015-06-16 ENCOUNTER — Encounter: Payer: Self-pay | Admitting: Physician Assistant

## 2015-06-16 VITALS — BP 120/70 | HR 82 | Temp 98.3°F | Ht 64.0 in | Wt 227.4 lb

## 2015-06-16 DIAGNOSIS — E118 Type 2 diabetes mellitus with unspecified complications: Secondary | ICD-10-CM | POA: Diagnosis not present

## 2015-06-16 DIAGNOSIS — IMO0002 Reserved for concepts with insufficient information to code with codable children: Secondary | ICD-10-CM

## 2015-06-16 DIAGNOSIS — E1165 Type 2 diabetes mellitus with hyperglycemia: Secondary | ICD-10-CM

## 2015-06-16 DIAGNOSIS — Z794 Long term (current) use of insulin: Secondary | ICD-10-CM

## 2015-06-16 MED ORDER — GLUCOSE BLOOD VI STRP: ORAL_STRIP | Status: DC

## 2015-06-16 MED ORDER — INSULIN DEGLUDEC 200 UNIT/ML ~~LOC~~ SOPN: 50.0000 [IU] | PEN_INJECTOR | Freq: Every day | SUBCUTANEOUS | Status: DC

## 2015-06-16 NOTE — Patient Instructions (Addendum)
Please continue the Invokana and Bydureon for now.  Titrate up the Tresiba by 2 units every 3-4 days until fasting sugars consistently between 90-100. If sugars are getting too low <80 -- decrease by 3-4 units.  Call me once sugars are at goal before stopping the Bydureon.  Follow-up early June for reassessment and repeat A1C.

## 2015-06-16 NOTE — Progress Notes (Signed)
Patient presents to clinic today for follow-up of uncontrolled DM after switching from Lantus to [place] due to compliance issues. Patient is taking [place] at 50 units daily. Is using her Bydureon and Invokana as directed. Fasting sugars are averaging 100-130.   Past Medical History  Diagnosis Date  . Hypertension   . Diabetes mellitus   . GERD (gastroesophageal reflux disease)   . Hyperlipidemia   . Obesity   . History of chicken pox   . Vitamin B12 deficiency   . Vitamin D deficiency     Current Outpatient Prescriptions on File Prior to Visit  Medication Sig Dispense Refill  . aspirin EC 81 MG tablet Take 81 mg by mouth daily.    . canagliflozin (INVOKANA) 300 MG TABS tablet Take 1 tablet (300 mg total) by mouth daily before breakfast. 90 tablet 1  . cyanocobalamin (,VITAMIN B-12,) 1000 MCG/ML injection Inject 1 mL (1,000 mcg total) into the muscle every 30 (thirty) days. 10 mL 0  . cyclobenzaprine (FLEXERIL) 10 MG tablet Take 1 tablet (10 mg total) by mouth 3 (three) times daily as needed for muscle spasms. 30 tablet 0  . Exenatide ER 2 MG PEN Inject 2 mg into the skin once a week. 12 each 1  . gabapentin (NEURONTIN) 300 MG capsule Take 1 capsule (300 mg total) by mouth at bedtime. 30 capsule 6  . Lancets (ONETOUCH ULTRASOFT) lancets Check 2x a day 100 each 11  . Multiple Vitamin (MULTIVITAMIN) tablet Take 1 tablet by mouth daily.    . naproxen (NAPROSYN) 500 MG tablet Take 1 tablet (500 mg total) by mouth 2 (two) times daily with a meal. 30 tablet 0  . pantoprazole (PROTONIX) 40 MG tablet Take 1 tablet (40 mg total) by mouth daily. 90 tablet 1  . Pitavastatin Calcium (LIVALO) 4 MG TABS Take 1 tablet (4 mg total) by mouth daily. 90 tablet 1  . ramipril (ALTACE) 2.5 MG capsule Take 1 capsule (2.5 mg total) by mouth daily. 90 capsule 3  . zolpidem (AMBIEN) 10 MG tablet Take 1/2 by mouth at night 15 tablet 2   No current facility-administered medications on file prior to visit.     Allergies  Allergen Reactions  . Metformin And Related     Made pt really sick  . Codeine Rash    Family History  Problem Relation Age of Onset  . Hypertension Father   . Diabetes Father   . Lung cancer Father   . Kidney failure Father     Living  . Hypertension Mother 14    Deceased  . Diabetes Mother   . Mental illness Mother     Schizophrenia  . Stroke Mother   . Bipolar disorder Mother   . Diabetes Sister     Deceased  . Stroke Sister   . Mental illness Brother     Deceased  . Colon cancer Paternal Grandfather   . Diabetes Other     Maternal/Paternal side  . Hypertension Other     Maternal/Paternal side  . Multiple sclerosis Maternal Aunt   . Hypertension Sister   . Healthy Son     x2    Social History   Social History  . Marital Status: Married    Spouse Name: N/A  . Number of Children: 2  . Years of Education: N/A   Occupational History  . Nurse [employer]   Social History Main Topics  . Smoking status: Former Smoker -- 1.00 packs/day  Types: Cigarettes    Start date: 06/11/2014  . Smokeless tobacco: Never Used     Comment: pt doing the vapor cigerette   . Alcohol Use: 0.0 oz/week    0 Standard drinks or equivalent per week     Comment: rarely  . Drug Use: No  . Sexual Activity: Not Asked   Other Topics Concern  . None   Social History Narrative   Review of Systems - See HPI.  All other ROS are negative.  BP 120/70 mmHg  Pulse 82  Temp(Src) 98.3 F (36.8 C) (Oral)  Ht '5\' 4"'  (1.626 m)  Wt 227 lb 6.4 oz (103.148 kg)  BMI 39.01 kg/m2  SpO2 98%  LMP 06/11/2015  Physical Exam  Constitutional: She is oriented to person, place, and time and well-developed, well-nourished, and in no distress.  HENT:  Head: Normocephalic and atraumatic.  Eyes: Conjunctivae are normal.  Cardiovascular: Normal rate, regular rhythm, normal heart sounds and intact distal pulses.   Pulmonary/Chest: Effort normal and breath sounds normal. No  respiratory distress. She has no wheezes. She has no rales. She exhibits no tenderness.  Neurological: She is alert and oriented to person, place, and time.  Skin: Skin is warm and dry. No rash noted.  Psychiatric: Affect normal.  Vitals reviewed.  Recent Results (from the past 2160 hour(s))  Comp Met (CMET)     Status: Abnormal   Collection Time: 05/19/15  8:17 AM  Result Value Ref Range   Sodium 137 135 - 145 mEq/L   Potassium 3.8 3.5 - 5.1 mEq/L   Chloride 104 96 - 112 mEq/L   CO2 24 19 - 32 mEq/L   Glucose, Bld 179 (H) 70 - 99 mg/dL   BUN 11 6 - 23 mg/dL   Creatinine, Ser 0.66 0.40 - 1.20 mg/dL   Total Bilirubin 0.3 0.2 - 1.2 mg/dL   Alkaline Phosphatase 52 39 - 117 U/L   AST 17 0 - 37 U/L   ALT 17 0 - 35 U/L   Total Protein 7.4 6.0 - 8.3 g/dL   Albumin 4.2 3.5 - 5.2 g/dL   Calcium 9.0 8.4 - 10.5 mg/dL   GFR 101.71 >60.00 mL/min  Lipid Profile     Status: None   Collection Time: 05/19/15  8:17 AM  Result Value Ref Range   Cholesterol 167 0 - 200 mg/dL    Comment: ATP III Classification       Desirable:  < 200 mg/dL               Borderline High:  200 - 239 mg/dL          High:  > = 240 mg/dL   Triglycerides 136.0 0.0 - 149.0 mg/dL    Comment: Normal:  <150 mg/dLBorderline High:  150 - 199 mg/dL   HDL 42.30 >39.00 mg/dL   VLDL 27.2 0.0 - 40.0 mg/dL   LDL Cholesterol 97 0 - 99 mg/dL   Total CHOL/HDL Ratio 4     Comment:                Men          Women1/2 Average Risk     3.4          3.3Average Risk          5.0          4.42X Average Risk          9.6  7.13X Average Risk          15.0          11.0                       NonHDL 124.36     Comment: NOTE:  Non-HDL goal should be 30 mg/dL higher than patient's LDL goal (i.e. LDL goal of < 70 mg/dL, would have non-HDL goal of < 100 mg/dL)  Hemoglobin A1c     Status: Abnormal   Collection Time: 05/19/15  8:17 AM  Result Value Ref Range   Hgb A1c MFr Bld 9.8 (H) 4.6 - 6.5 %    Comment: Glycemic Control Guidelines for  People with Diabetes:Non Diabetic:  <6%Goal of Therapy: <7%Additional Action Suggested:  >8%     Assessment/Plan: Diabetes mellitus type 2 with complications, uncontrolled Again declines foot exam. Declines pneumonia vaccine. Fasting sugars are getting closer to goal. Will continue Titration of Tresiba until sugars 90-100. Patient to call at that point to discuss ongoing management. Follow-up in June for reassessment and labs.

## 2015-06-16 NOTE — Assessment & Plan Note (Signed)
Again declines foot exam. Declines pneumonia vaccine. Fasting sugars are getting closer to goal. Will continue Titration of Tresiba until sugars 90-100. Patient to call at that point to discuss ongoing management. Follow-up in June for reassessment and labs.

## 2015-06-16 NOTE — Progress Notes (Signed)
Pre visit review using our clinic review tool, if applicable. No additional management support is needed unless otherwise documented below in the visit note. 

## 2015-07-01 ENCOUNTER — Telehealth: Payer: Self-pay

## 2015-07-07 MED FILL — TRESIBA FLEXTOUCH 200 UNITS: 200 | 84 days supply | Qty: 21 | Fill #1

## 2015-07-22 ENCOUNTER — Other Ambulatory Visit: Payer: Self-pay | Admitting: *Deleted

## 2015-07-22 ENCOUNTER — Encounter: Payer: Self-pay | Admitting: *Deleted

## 2015-07-22 VITALS — BP 102/74 | Ht 64.0 in | Wt 226.4 lb

## 2015-07-22 DIAGNOSIS — E1140 Type 2 diabetes mellitus with diabetic neuropathy, unspecified: Secondary | ICD-10-CM

## 2015-07-22 LAB — POCT CBG (FASTING - GLUCOSE)-MANUAL ENTRY: Glucose Fasting, POC: 172 mg/dL — AB (ref 70–99)

## 2015-07-22 LAB — POCT GLYCOSYLATED HEMOGLOBIN (HGB A1C): HEMOGLOBIN A1C: 8.6

## 2015-07-22 NOTE — Patient Outreach (Signed)
Shelley Garcia [facility]) Care Management   07/22/2015  Shelley Garcia 08/15/1965 [id number]  Shelley Garcia is an 50 y.o. female who presents to the [place] [facility] office for routine Link To Wellness follow up for self management assistance with Type II DM, HTN and hyperlipidemia.  Subjective:  Shelley Garcia says she feels much better since her primary care provider started her on Tresiba and her blood sugar control has improve. She says she is titrating the dose and to achieve fasting CBGS of 90-100. She says she feels low when her blood sugar is around 110 but she says she is getting to use lower blood sugar numbers over time. She is requesting that a Hgb A1C be checked today. She was disappointed  it was still elevated at 8.6% and asked what other medication options there are to decrease it. She is voicing great concern about weight gain and says the she cannot afford metabolic surgery so she is asking for other options to help her with weight loss.  Objective:   Review of Systems  Constitutional: Negative.     Physical Exam  Constitutional: She is oriented to person, place, and time. She appears well-developed and well-nourished.  Respiratory: Effort normal.  Neurological: She is alert and oriented to person, place, and time.  Skin: Skin is warm and dry.  Psychiatric: She has a normal mood and affect. Her behavior is normal. Judgment and thought content normal.   Filed Weights   07/22/15 1256  Weight: 226 lb 6.4 oz (102.694 kg)   Filed Vitals:   07/22/15 1256  BP: 102/74   POC CBG= 172 (after coffee with sweetened creamer) POC Hgb A1C= 8.6%  Encounter Medications:   Outpatient Encounter Prescriptions as of 07/22/2015  Medication Sig Note  . aspirin EC 81 MG tablet Take 81 mg by mouth daily.   . canagliflozin (INVOKANA) 300 MG TABS tablet Take 1 tablet (300 mg total) by mouth daily before breakfast.   . cholecalciferol (VITAMIN D)  1000 units tablet Take 2,000 Units by mouth daily.   . Exenatide ER 2 MG PEN Inject 2 mg into the skin once a week. 07/22/2015: Tries to take on Friday  . gabapentin (NEURONTIN) 300 MG capsule Take 1 capsule (300 mg total) by mouth at bedtime. 07/22/2015: Takes prn  . Insulin Degludec (TRESIBA FLEXTOUCH) 200 UNIT/ML SOPN Inject 50 Units into the skin daily. She is now taking 60 units twice daily  . pantoprazole (PROTONIX) 40 MG tablet Take 1 tablet (40 mg total) by mouth daily.   . Pitavastatin Calcium (LIVALO) 4 MG TABS Take 1 tablet (4 mg total) by mouth daily.   . ramipril (ALTACE) 2.5 MG capsule Take 1 capsule (2.5 mg total) by mouth daily.   Marland Kitchen zolpidem (AMBIEN) 10 MG tablet Take 1/2 by mouth at night   . glucose blood test strip Check 2x a day   . Lancets (ONETOUCH ULTRASOFT) lancets Check 2x a day   . Multiple Vitamin (MULTIVITAMIN) tablet Take 1 tablet by mouth daily. Reported on 07/22/2015   . naproxen (NAPROSYN) 500 MG tablet Take 1 tablet (500 mg total) by mouth 2 (two) times daily with a meal.    No facility-administered encounter medications on file as of 07/22/2015.    Functional Status:   In your present state of health, do you have any difficulty performing the following activities: 07/22/2015 03/11/2015  Hearing? N N  Vision? N N  Difficulty concentrating or making decisions? N  N  Walking or climbing stairs? N N  Dressing or bathing? N N  Doing errands, shopping? N N    Fall/Depression Screening:    PHQ 2/9 Scores 03/11/2015 07/26/2013  PHQ - 2 Score 2 0  PHQ- 9 Score 9 -    Assessment:   [place] RN and Link To Wellness member with morbid obesity, Type II DM, HTN and hyperlipidemia meeting treatment targets for HTN and lipid control. Improved glycemic control with Shelley Garcia but POC Hgb A1C remains elevated at 8.6% today.  Plan:  [name] CM Care Plan Problem One        Most Recent Value   Care Plan Problem One  Member with morbid obesity (BMI= 38.8) , Type II DM, HTN and  hyperlipidemia- improved glycemic and lipid control as evidenced by decreased POC Hgb A1C of today and normal lipid profile on 05/19/15, meeting fasting blood sugars targets >80% of the time, ongoing good HTN control as evidenced by BP meeting treatment targets    Role Documenting the Problem One  Care Management [place] for Problem One  Active   [facility] Long Term Goal (31-90 days)  Ongoing improved glycemic control as evidenced by improved Hgb A1C of <7.5% with at least 75% of CBGs meeting target, ongoing normal lipid profile, BP readings will continue to meet treatment targets of <140/<90 and evidence of weight loss or no weight gain   [facility] Long Term Goal Start Date  07/22/15   [facility] Long Term Goal Met Date     Interventions for Problem One Long Term Goal  reviewed current treatment plan and fasting blood sugar values, reviewed medications and medication adherence, discussed DM medications of Invokana, Bydureon and Tresiba, will ask PA Shelley Garcia to write rx to reflect that she is titrating the [place] so refills are not an issue in the future, reviewed fasting CBG values and percentage that are meeting the target of 90-100, discussed results of today's POC Hgb A1C, Hgb A1C goal, and correlation to estimated average glucose, congratulated Shelley Garcia on the significant improvement in Hgb A1C, discussed option of mealtime insulin to improve glycemic control since post prandial spikes are likely causing the high Hgb A1C since the majority of her fasting sugars are meeting target, encouraged her to occasionally check post meal CBGS especially when she drinks sweetened soda, discussed role and benefits of continuous glucose monitor and Link To Wellness/[place] plan benefit coverage and encouraged her to discuss with her provider , since Shelley Garcia states she cannot afford metabolic weight loss surgery due to the insurance annual deductible, discussed weight  loss strategies including referral to RD, also  discussed FDA approved pharmacologic weight loss medications, will investigate copay costs for each and provide information to Shelley Garcia, reviewed results of labs drawn on 05/19/15 including Hgb A1C, CMET and lipid profile ,congratulated Aneita on her improved medication adherence as evidenced by normal lipid profile, reviewed yearly health maintenance checks and encouraged Quinley to make eye exam appointment, asked Kmya to update this RNCM if her DM treatment regimen changes when she sees her provider again, arranged for Link To Wellness follow up in July      RNCM to fax today's office visit note to [place] will meet quarterly and as needed with patient per Link To Wellness program guidelines to assist with Type II DM, HTN,hyperlipidemia and weight self-management and assess patient's progress toward mutually set goals.

## 2015-07-30 ENCOUNTER — Other Ambulatory Visit: Payer: Self-pay | Admitting: Physician Assistant

## 2015-07-30 ENCOUNTER — Telehealth: Payer: Self-pay | Admitting: Physician Assistant

## 2015-07-30 MED ORDER — INSULIN DEGLUDEC 200 UNIT/ML ~~LOC~~ SOPN: 60.0000 [IU] | PEN_INJECTOR | Freq: Every day | SUBCUTANEOUS | Status: DC

## 2015-07-30 NOTE — Telephone Encounter (Signed)
Patient informed, understood & agreed; scheduled f/u for 09/01/15 at 7:15a. Pt states she is currently taking 60 units daily and her glucose numbers are around 110 [not above the teens]/SLS 05/03

## 2015-07-30 NOTE — Telephone Encounter (Signed)
Please send in new script for [name] (66-month supply) stating patient is titrating up on insulin. Max dose 80 units. This way she can get more pens covered by insurance.

## 2015-07-30 NOTE — Telephone Encounter (Signed)
Rx request to pharmacy/SLS  

## 2015-07-30 NOTE — Telephone Encounter (Signed)
Please call patient.  I received a note from her dietician regarding issues with [name] quantity. Please verify how many units she is taking currently so we can get a new prescription sent in with enough medication to last her 3 months each time.  Also make sure she has a follow-up scheduled with me to discuss weight and sugar levels.

## 2015-08-12 DIAGNOSIS — E119 Type 2 diabetes mellitus without complications: Secondary | ICD-10-CM | POA: Diagnosis not present

## 2015-08-12 DIAGNOSIS — H0015 Chalazion left lower eyelid: Secondary | ICD-10-CM | POA: Diagnosis not present

## 2015-08-12 DIAGNOSIS — H5213 Myopia, bilateral: Secondary | ICD-10-CM | POA: Diagnosis not present

## 2015-08-26 ENCOUNTER — Other Ambulatory Visit: Payer: Self-pay | Admitting: Internal Medicine

## 2015-08-26 MED FILL — PANTOPRAZOLE SOD DR 40 MG T: 40 | 90 days supply | Qty: 90 | Fill #1

## 2015-08-26 MED FILL — RAMIPRIL 2.5 MG CAPSULE: 2.5 | 90 days supply | Qty: 90 | Fill #2

## 2015-08-26 MED FILL — BYDUREON 2 MG PEN INJECT: 2 | 84 days supply | Qty: 12 | Fill #0

## 2015-08-26 MED FILL — LIVALO 4 MG TABLET: 4 | 90 days supply | Qty: 90 | Fill #1

## 2015-08-26 MED FILL — INVOKANA 300 MG TABLET: 300 | 90 days supply | Qty: 90 | Fill #1

## 2015-09-01 ENCOUNTER — Ambulatory Visit (HOSPITAL_BASED_OUTPATIENT_CLINIC_OR_DEPARTMENT_OTHER)
Admission: RE | Admit: 2015-09-01 | Discharge: 2015-09-01 | Disposition: A | Discharge status: Home / Self Care | Payer: 59 | Source: Ambulatory Visit | Attending: Physician Assistant | Admitting: Physician Assistant

## 2015-09-01 ENCOUNTER — Ambulatory Visit (INDEPENDENT_AMBULATORY_CARE_PROVIDER_SITE_OTHER): Payer: 59 | Admitting: Physician Assistant

## 2015-09-01 ENCOUNTER — Encounter: Payer: Self-pay | Admitting: Physician Assistant

## 2015-09-01 VITALS — BP 127/86 | HR 82 | Temp 97.9°F | Resp 16 | Ht 64.0 in | Wt 227.2 lb

## 2015-09-01 DIAGNOSIS — M4312 Spondylolisthesis, cervical region: Secondary | ICD-10-CM | POA: Insufficient documentation

## 2015-09-01 DIAGNOSIS — E1165 Type 2 diabetes mellitus with hyperglycemia: Secondary | ICD-10-CM | POA: Diagnosis not present

## 2015-09-01 DIAGNOSIS — Z794 Long term (current) use of insulin: Secondary | ICD-10-CM

## 2015-09-01 DIAGNOSIS — M8938 Hypertrophy of bone, other site: Secondary | ICD-10-CM | POA: Insufficient documentation

## 2015-09-01 DIAGNOSIS — E118 Type 2 diabetes mellitus with unspecified complications: Secondary | ICD-10-CM

## 2015-09-01 DIAGNOSIS — M542 Cervicalgia: Secondary | ICD-10-CM | POA: Insufficient documentation

## 2015-09-01 DIAGNOSIS — IMO0002 Reserved for concepts with insufficient information to code with codable children: Secondary | ICD-10-CM

## 2015-09-01 HISTORY — DX: Cervicalgia: M54.2

## 2015-09-01 MED FILL — CONTOUR NEXT STRIPS: 50 days supply | Qty: 100 | Fill #0

## 2015-09-01 MED FILL — CONTOUR NEXT EZ METER: W/DEVICE | 1 days supply | Qty: 1 | Fill #0

## 2015-09-01 NOTE — Assessment & Plan Note (Signed)
Recent A1C improved but still above goal. Will continue current meds at same doses. Will add on post-prandial Humalog following sliding scale given. She is to check fasting sugars, meal time sugars and bedtime. Dietary measures discussed. She is to call in the next week with sugar levels for review.

## 2015-09-01 NOTE — Patient Instructions (Signed)
Sliding scale for meal-time insulin: No units til 200  then 2 units for 201 to 250 4 units for 251 to 300  6 units for 301 to 350  8 units for 351 to 400 10 units for 401 to 450 Call if higher then 450  Continue other medications as directed. Check sugars each morning, before meals and at bedtime. Write down and call me in 1 week to let me know how sugars are doing.   Follow-up with me in 1 month.

## 2015-09-01 NOTE — Progress Notes (Signed)
History of Present Illness: Patient is a 50 y.o. female who presents to clinic today for follow-up of Diabetes Mellitus II, previously uncontrolled but without complications.  Patient currently on medication regimen of Invokana 300 mg, Tresiba 64 units and Bydureon once weekly.  Is taking medications as directed. Endorses fasting sugars are running around 110-120 but recent A1C with dietician at 8.6.  Denies polyuria, polydipsia, polyphagia.    Patient endorses continued cervical neck pain, bilateral, worse after working her work shifts. Denies radiation into upper extremities. Does have history of headaches with some mild dizziness. Denies trauma or injury.  Latest Maintenance: A1C --  Lab Results  Component Value Date   HGBA1C 8.6 07/22/2015   Diabetic Eye Exam -- up-to-exam Urine Microalbumin -- Currently on ACEI. Foot Exam -- Due today. Denies concerns.  Past Medical History  Diagnosis Date  . Hypertension   . Diabetes mellitus   . GERD (gastroesophageal reflux disease)   . Hyperlipidemia   . Obesity   . History of chicken pox   . Vitamin B12 deficiency   . Vitamin D deficiency     Current Outpatient Prescriptions on File Prior to Visit  Medication Sig Dispense Refill  . aspirin EC 81 MG tablet Take 81 mg by mouth daily.    . canagliflozin (INVOKANA) 300 MG TABS tablet Take 1 tablet (300 mg total) by mouth daily before breakfast. 90 tablet 1  . cholecalciferol (VITAMIN D) 1000 units tablet Take 2,000 Units by mouth daily.    . cyanocobalamin (,VITAMIN B-12,) 1000 MCG/ML injection Inject 1 mL (1,000 mcg total) into the muscle every 30 (thirty) days. 10 mL 0  . cyclobenzaprine (FLEXERIL) 10 MG tablet Take 1 tablet (10 mg total) by mouth 3 (three) times daily as needed for muscle spasms. 30 tablet 0  . Exenatide ER 2 MG PEN Inject 2 mg into the skin once a week. 12 each 1  . gabapentin (NEURONTIN) 300 MG capsule Take 1 capsule (300 mg total) by mouth at bedtime. 30 capsule 6    . glucose blood test strip Check 2x a day 100 each 11  . Insulin Degludec (TRESIBA FLEXTOUCH) 200 UNIT/ML SOPN Inject 60 Units into the skin daily. Titrating dosage, Max daily is 80 units (Patient taking differently: Inject 64 Units into the skin daily. Titrating dosage, Max daily is 80 units) 36 mL 1  . Lancets (ONETOUCH ULTRASOFT) lancets Check 2x a day 100 each 11  . Multiple Vitamin (MULTIVITAMIN) tablet Take 1 tablet by mouth daily. Reported on 07/22/2015    . naproxen (NAPROSYN) 500 MG tablet Take 1 tablet (500 mg total) by mouth 2 (two) times daily with a meal. 30 tablet 0  . pantoprazole (PROTONIX) 40 MG tablet Take 1 tablet (40 mg total) by mouth daily. 90 tablet 1  . Pitavastatin Calcium (LIVALO) 4 MG TABS Take 1 tablet (4 mg total) by mouth daily. 90 tablet 1  . ramipril (ALTACE) 2.5 MG capsule Take 1 capsule (2.5 mg total) by mouth daily. 90 capsule 3  . zolpidem (AMBIEN) 10 MG tablet Take 1/2 by mouth at night 15 tablet 2   No current facility-administered medications on file prior to visit.    Allergies  Allergen Reactions  . Metformin And Related     Made pt really sick  . Codeine Rash    Family History  Problem Relation Age of Onset  . Hypertension Father   . Diabetes Father   . Lung cancer Father   .  Kidney failure Father     Living  . Hypertension Mother 77    Deceased  . Diabetes Mother   . Mental illness Mother     Schizophrenia  . Stroke Mother   . Bipolar disorder Mother   . Diabetes Sister     Deceased  . Stroke Sister   . Mental illness Brother     Deceased  . Colon cancer Paternal Grandfather   . Diabetes Other     Maternal/Paternal side  . Hypertension Other     Maternal/Paternal side  . Multiple sclerosis Maternal Aunt   . Hypertension Sister   . Healthy Son     x2    Social History   Social History  . Marital Status: Married    Spouse Name: N/A  . Number of Children: 2  . Years of Education: N/A   Occupational History  . Nurse  [employer]   Social History Main Topics  . Smoking status: Former Smoker -- 1.00 packs/day    Types: Cigarettes    Start date: 06/11/2014  . Smokeless tobacco: Never Used     Comment: pt doing the vapor cigerette   . Alcohol Use: 0.0 oz/week    0 Standard drinks or equivalent per week     Comment: rarely  . Drug Use: No  . Sexual Activity: Not Asked   Other Topics Concern  . None   Social History Narrative   Review of Systems: Pertinent ROS are listed in HPI  Physical Examination: General appearance: alert, cooperative, appears stated age and no distress Head: Normocephalic, without obvious abnormality, atraumatic Lungs: clear to auscultation bilaterally Heart: regular rate and rhythm, S1, S2 normal, no murmur, click, rub or gallop Extremities: extremities normal, atraumatic, no cyanosis or edema Pulses: 2+ and symmetric Skin: Skin color, texture, turgor normal. No rashes or lesions Neurologic: Alert and oriented X 3, normal strength and tone. Normal symmetric reflexes. Normal coordination and gait  Musculoskeletal: cervical neck pain not reproducible on exam. ROM intact without pain. No bony tenderness or palpable spasm. No carotid bruits auscultated.  Diabetic Foot Exam - Simple   Simple Foot Form  Diabetic Foot exam was performed with the following findings:  Yes 09/01/2015  7:57 AM  Visual Inspection  No deformities, no ulcerations, no other skin breakdown bilaterally:  Yes  Sensation Testing  Intact to touch and monofilament testing bilaterally:  Yes  Pulse Check  Posterior Tibialis and Dorsalis pulse intact bilaterally:  Yes  Comments     Assessment/Plan: Diabetes mellitus type 2 with complications, uncontrolled Recent A1C improved but still above goal. Will continue current meds at same doses. Will add on post-prandial Humalog following sliding scale given. She is to check fasting sugars, meal time sugars and bedtime. Dietary measures discussed. She is to call in  the next week with sugar levels for review.  Cervical spine pain Will repeat x-ray today. Exam unremarkable today. If x-ray unremarkable will refer to Neurology for further assessment.

## 2015-09-01 NOTE — Progress Notes (Signed)
Pre visit review using our clinic review tool, if applicable. No additional management support is needed unless otherwise documented below in the visit note/SLS  

## 2015-09-01 NOTE — Assessment & Plan Note (Signed)
Will repeat x-ray today. Exam unremarkable today. If x-ray unremarkable will refer to Neurology for further assessment.

## 2015-09-02 ENCOUNTER — Other Ambulatory Visit: Payer: Self-pay | Admitting: Physician Assistant

## 2015-09-02 DIAGNOSIS — R51 Headache: Secondary | ICD-10-CM

## 2015-09-02 DIAGNOSIS — M542 Cervicalgia: Secondary | ICD-10-CM

## 2015-09-02 DIAGNOSIS — R519 Headache, unspecified: Secondary | ICD-10-CM

## 2015-09-09 DIAGNOSIS — S93601A Unspecified sprain of right foot, initial encounter: Secondary | ICD-10-CM | POA: Diagnosis not present

## 2015-10-01 ENCOUNTER — Telehealth: Payer: Self-pay | Admitting: Physician Assistant

## 2015-10-01 NOTE — Telephone Encounter (Signed)
Pt called in to make PCP aware to be expecting FMLA paperwork for her because of her neck issue. She says that the first available appt is in August with Dr. Tomi Likens. She also would like to know if possible could she be seen by someone else sooner ?    CB: [phone]

## 2015-10-01 NOTE — Telephone Encounter (Signed)
FMLA forms received via fax from [employer]. Completed as much as possible and forwarded to [facility]. JG//CMA

## 2015-10-02 NOTE — Telephone Encounter (Signed)
Has patient been out of work since last visit or is she just requesting intermittent FMLA in case of flare up of pain until she sees Neurology? I have papers but need more information before I can finish completing.

## 2015-10-03 ENCOUNTER — Encounter: Payer: Self-pay | Admitting: *Deleted

## 2015-10-03 NOTE — Telephone Encounter (Signed)
Called pt, phone rang and rang without an option to leave a voicemail. Since pt is active on MyChart, I sent her message as well to clarify the type of leave she is requesting.

## 2015-10-21 ENCOUNTER — Ambulatory Visit: Payer: Self-pay | Admitting: *Deleted

## 2015-10-27 MED FILL — TRESIBA FLEXTOUCH 200 UNITS: 200 | 90 days supply | Qty: 36 | Fill #0

## 2015-11-05 ENCOUNTER — Ambulatory Visit (INDEPENDENT_AMBULATORY_CARE_PROVIDER_SITE_OTHER): Payer: 59 | Admitting: Neurology

## 2015-11-05 ENCOUNTER — Encounter: Payer: Self-pay | Admitting: Neurology

## 2015-11-05 VITALS — Ht 63.0 in | Wt 228.0 lb

## 2015-11-05 DIAGNOSIS — G4441 Drug-induced headache, not elsewhere classified, intractable: Secondary | ICD-10-CM

## 2015-11-05 DIAGNOSIS — G43109 Migraine with aura, not intractable, without status migrainosus: Secondary | ICD-10-CM

## 2015-11-05 DIAGNOSIS — G4440 Drug-induced headache, not elsewhere classified, not intractable: Secondary | ICD-10-CM

## 2015-11-05 DIAGNOSIS — H8110 Benign paroxysmal vertigo, unspecified ear: Secondary | ICD-10-CM

## 2015-11-05 DIAGNOSIS — R202 Paresthesia of skin: Secondary | ICD-10-CM | POA: Diagnosis not present

## 2015-11-05 DIAGNOSIS — M5090 Cervical disc disorder, unspecified, unspecified cervical region: Secondary | ICD-10-CM | POA: Diagnosis not present

## 2015-11-05 DIAGNOSIS — I679 Cerebrovascular disease, unspecified: Secondary | ICD-10-CM

## 2015-11-05 DIAGNOSIS — R200 Anesthesia of skin: Secondary | ICD-10-CM

## 2015-11-05 MED FILL — ZOLPIDEM TARTRATE 10 MG TAB: 10 | 30 days supply | Qty: 15 | Fill #1

## 2015-11-05 NOTE — Progress Notes (Signed)
NEUROLOGY CONSULTATION NOTE  Shelley Garcia MRN: [id number] DOB: 08-Dec-1965  Referring provider: Elyn Aquas, PA-C Primary care provider: Elyn Aquas, PA-C  Reason for consult:  Neck pain, headache  HISTORY OF PRESENT ILLNESS: Shelley Garcia is a 50 year old right-handed woman with cigarette smoking, hypertension, diabetes and hyperlipidemia who presents for neck pain.  History obtained by patient and PCP notes.  She has chronic neck pain, at the posterior base of her neck and across the shoulders.  There is no radicular pain.  It flare-ups from time to time, not triggered by any particular activity or position.  She takes muscle relaxants, with limited relief.  She had cervical spine films performed on 09/01/15, which were personally reviewed and revealed trace anterolisthesis with moderate foraminal narrowing at C3-C4, but otherwise unremarkable.  She denies numbness and tingling, except she started having numbness and tingling in the right hand about 1 to 2 weeks ago.  She notices it at night in bed.  It doesn't last long.  She does not drop objects.  She has history of migraines since teenager.  They are posterior, pounding, associated with nausea and vomiting.  They are sometimes preceded by phantosmia or visual aura.  They are now infrequent.  However, she has longstanding chronic daily headaches, which are dull and not associated with other symptoms.  She takes ibuprofen daily.  Sometimes when she lays down, she experiences spinning sensation associated with nausea lasting a few seconds.  It tends to occur when neck pain flare-ups.  She needs to get up and it resolves.  She had an MRI of brain without contrast on 05/24/12 for right facial numbness and tingling, headache and visual disturbance, which was personally reviewed and revealed showed nonspecific scattered white matter foci of abnormal signal in the supratentorium.  At that time, she was referred to neurology and reportedly had  a negative workup for conditions such as MS.  PAST MEDICAL HISTORY: Past Medical History:  Diagnosis Date  . Diabetes mellitus   . GERD (gastroesophageal reflux disease)   . History of chicken pox   . Hyperlipidemia   . Hypertension   . Obesity   . Vitamin B12 deficiency   . Vitamin D deficiency         Kidney stones  PAST SURGICAL HISTORY: Past Surgical History:  Procedure Laterality Date  . CHOLECYSTECTOMY    . EXPLORATORY LAPAROTOMY    . KIDNEY STONE SURGERY    . WISDOM TOOTH EXTRACTION  07/31/2013    MEDICATIONS: Current Outpatient Prescriptions on File Prior to Visit  Medication Sig Dispense Refill  . aspirin EC 81 MG tablet Take 81 mg by mouth daily.    . canagliflozin (INVOKANA) 300 MG TABS tablet Take 1 tablet (300 mg total) by mouth daily before breakfast. 90 tablet 1  . cholecalciferol (VITAMIN D) 1000 units tablet Take 2,000 Units by mouth daily.    . cyclobenzaprine (FLEXERIL) 10 MG tablet Take 1 tablet (10 mg total) by mouth 3 (three) times daily as needed for muscle spasms. 30 tablet 0  . Exenatide ER 2 MG PEN Inject 2 mg into the skin once a week. 12 each 1  . glucose blood test strip Check 2x a day 100 each 11  . Insulin Degludec (TRESIBA FLEXTOUCH) 200 UNIT/ML SOPN Inject 60 Units into the skin daily. Titrating dosage, Max daily is 80 units (Patient taking differently: Inject 64 Units into the skin daily. Titrating dosage, Max daily is 80 units) 36 mL 1  .  Lancets (ONETOUCH ULTRASOFT) lancets Check 2x a day 100 each 11  . Multiple Vitamin (MULTIVITAMIN) tablet Take 1 tablet by mouth daily. Reported on 07/22/2015    . naproxen (NAPROSYN) 500 MG tablet Take 1 tablet (500 mg total) by mouth 2 (two) times daily with a meal. 30 tablet 0  . pantoprazole (PROTONIX) 40 MG tablet Take 1 tablet (40 mg total) by mouth daily. 90 tablet 1  . Pitavastatin Calcium (LIVALO) 4 MG TABS Take 1 tablet (4 mg total) by mouth daily. 90 tablet 1  . ramipril (ALTACE) 2.5 MG capsule Take 1  capsule (2.5 mg total) by mouth daily. 90 capsule 3  . zolpidem (AMBIEN) 10 MG tablet Take 1/2 by mouth at night 15 tablet 2  . cyanocobalamin (,VITAMIN B-12,) 1000 MCG/ML injection Inject 1 mL (1,000 mcg total) into the muscle every 30 (thirty) days. (Patient not taking: Reported on 11/05/2015) 10 mL 0   No current facility-administered medications on file prior to visit.     ALLERGIES: Allergies  Allergen Reactions  . Metformin And Related     Made pt really sick  . Codeine Rash    FAMILY HISTORY: Family History  Problem Relation Age of Onset  . Hypertension Father   . Diabetes Father   . Lung cancer Father   . Kidney failure Father     Living  . Hypertension Mother 18    Deceased  . Diabetes Mother   . Mental illness Mother     Schizophrenia  . Stroke Mother   . Bipolar disorder Mother   . Diabetes Sister     Deceased  . Stroke Sister   . Mental illness Brother     Deceased  . Colon cancer Paternal Grandfather   . Diabetes Other     Maternal/Paternal side  . Hypertension Other     Maternal/Paternal side  . Multiple sclerosis Maternal Aunt   . Hypertension Sister   . Healthy Son     x2    SOCIAL HISTORY: Social History   Social History  . Marital status: Married    Spouse name: N/A  . Number of children: 2  . Years of education: N/A   Occupational History  . Nurse [employer]   Social History Main Topics  . Smoking status: Former Smoker    Packs/day: 1.00    Types: Cigarettes    Start date: 06/11/2014  . Smokeless tobacco: Never Used     Comment: pt doing the vapor cigerette   . Alcohol use 0.0 oz/week     Comment: rarely  . Drug use: No  . Sexual activity: Not on file   Other Topics Concern  . Not on file   Social History Narrative  . No narrative on file    REVIEW OF SYSTEMS: Constitutional: No fevers, chills, or sweats, no generalized fatigue, change in appetite Eyes: No visual changes, double vision, eye pain Ear, nose and throat: No  hearing loss, ear pain, nasal congestion, sore throat Cardiovascular: No chest pain, palpitations Respiratory:  No shortness of breath at rest or with exertion, wheezes GastrointestinaI: No nausea, vomiting, diarrhea, abdominal pain, fecal incontinence Genitourinary:  No dysuria, urinary retention or frequency Musculoskeletal:  Neck pain Integumentary: No rash, pruritus, skin lesions Neurological: as above Psychiatric: No depression, insomnia, anxiety Endocrine: No palpitations, fatigue, diaphoresis, mood swings, change in appetite, change in weight, increased thirst Hematologic/Lymphatic:  No purpura, petechiae. Allergic/Immunologic: no itchy/runny eyes, nasal congestion, recent allergic reactions, rashes  PHYSICAL EXAM: BP  134/86, Pulse 86, BMI 40.39 General: No acute distress.  Patient appears well-groomed.  Head:  Normocephalic/atraumatic Eyes:  fundi examined but not visualized Neck: supple, llower paraspinal tenderness, full range of motion Back: No paraspinal tenderness Heart: regular rate and rhythm Lungs: Clear to auscultation bilaterally. Vascular: No carotid bruits. Neurological Exam: Mental status: alert and oriented to person, place, and time, recent and remote memory intact, fund of knowledge intact, attention and concentration intact, speech fluent and not dysarthric, language intact. Cranial nerves: CN I: not tested CN II: pupils equal, round and reactive to light, visual fields intact CN III, IV, VI:  full range of motion, no nystagmus, no ptosis CN V: facial sensation intact CN VII: upper and lower face symmetric CN VIII: hearing intact CN IX, X: gag intact, uvula midline CN XI: sternocleidomastoid and trapezius muscles intact CN XII: tongue midline Bulk & Tone: normal, no fasciculations. Motor:  5/5 throughout  Sensation: temperature and vibration sensation intact. Deep Tendon Reflexes:  2+ throughout, toes downgoing.  Finger to nose testing:  Without  dysmetria.  Heel to shin:  Without dysmetria.  Gait:  Normal station and stride.  Able to turn and tandem walk. Romberg negative Tinel's sign negative.Marland Kitchen  IMPRESSION: 1.  Cervical neck pain, non-radicular 2.  Medication overuse headache 3.  Migraine with aura, stable 4.  BPPV 5.  Right hand numbness, possibly early Carpal Tunnel 6.  Cerebrovascular disease as demonstrated on MRI (history of smoking, HTN, hyperlipidemia and diabetes)  PLAN: 1.  Will refer him to Dr. Hulan Saas to address neck pain 2.  She would not like to start a preventative medication for headache, such as nortriptyline, or abortive medication such as sumatriptan.  We discussed supplements such as Mg, riboflavin, or coenzyme Q10.  We also discussed biofeedback or dry needling.  She would like to see how she does after seeing Dr. Tamala Julian first. 3.  Advised to stop ibuprofen and limit use of pain relievers to no more than 2 days out of the week 4.  Advised to try wrist splint at night.  If not better in 2 to 4 weeks, we can get NCV-EMG 5.  If vertigo persists, consider vestibular rehab 6.  Weight loss 7.  Follow up in 3 months.  Thank you for allowing me to take part in the care of this patient.  Metta Clines, DO  CC:  Leeanne Rio, PA-C

## 2015-11-05 NOTE — Patient Instructions (Signed)
Migraine Recommendations: 1.  I will refer you to Dr. Hulan Saas for treatment of neck pain.  I want to see you back in 3 months.  If headaches not improved, we can discuss further treatments as discussed (antidepressant therapy, Biofeedback, etc). 2.  Limit use of pain relievers to no more than 2 days out of the week.  These medications include acetaminophen, ibuprofen, triptans and narcotics.  This will help reduce risk of rebound headaches.  Ideally, I would avoid ibuprofen all together since you have been overusing this medication (instead, consider tylenol or sumatriptan).  Otherwise, just limit it to 2 days out of the week. 3.  Be aware of common food triggers such as processed sweets, processed foods with nitrites (such as deli meat, hot dogs, sausages), foods with MSG, alcohol (such as wine), chocolate, certain cheeses, certain fruits (dried fruits, some citrus fruit), vinegar, diet soda. 4.  Avoid caffeine 5.  Routine exercise 6.  Proper sleep hygiene 7.  Stay adequately hydrated with water 8.  Keep a headache diary. 9.  Maintain proper stress management. 10.  Do not skip meals. 11.  Consider supplements:  Magnesium oxide 400mg  to 600mg  daily, riboflavin 400mg , Coenzyme Q 10 100mg  three times daily 12.  Try wearing wrist splint to see if hand numbness improves.  If it doesn't improvement in one month, contact me and we can get a nerve test. 13.  Follow up in 3 months.

## 2015-11-17 NOTE — Progress Notes (Signed)
Tawana ScaleZach  D.O. [facility] [address] [place], [place] [address] Phone: [phone] Subjective:    I'm seeing this patient by the request  of:  Piedad ClimesMartin, William Cody, PA-C, jaffe D.O.   CC: Neck pain  BJY:[id number]:Subjective  Shelley Garcia is a 50 y.o. female coming in with complaint of neck pain. Patient does have a past medical history significant for chronic neck pain. Patient states that the pain seems to go from the posterior aspect of the bottom of the skull way to the shoulders bilaterally. Denies any significant radiation down the arms. Patient states that it seems to be intermittent but seems to be more positional. Patient has tried things such as muscle relaxers with limited relief. Patient has had workup including x-rays taken 09/01/2015 that were independently visualized by me showing moderate foraminal narrowing and degenerative disc disease mostly at C3-C4 but otherwise unremarkable. Patient was sent here for other treatment options with her wanting to avoid certain medications if possible.  Patient states that it is starting to give her more trouble such as waking her up at night, patient denies though any weakness of the extremities. Seems to cause chronic daily headaches that are dull. Patient is seen neurology for further workup of this.Patient has had history of vertigo with this as well. Patient states that it has caused her to unfortunate call out of work previously because the amount of pain. Rates the severity of pain a 7 out of 10.    Past Medical History:  Diagnosis Date  . Diabetes mellitus   . GERD (gastroesophageal reflux disease)   . History of chicken pox   . Hyperlipidemia   . Hypertension   . Obesity   . Vitamin B12 deficiency   . Vitamin D deficiency    Past Surgical History:  Procedure Laterality Date  . CHOLECYSTECTOMY    . EXPLORATORY LAPAROTOMY    . KIDNEY STONE SURGERY    . WISDOM TOOTH EXTRACTION  07/31/2013   Social History    Social History  . Marital status: Married    Spouse name: N/A  . Number of children: 2  . Years of education: N/A   Occupational History  . Nurse [employer]   Social History Main Topics  . Smoking status: Former Smoker    Packs/day: 1.00    Types: Cigarettes    Start date: 06/11/2014  . Smokeless tobacco: Never Used     Comment: pt doing the vapor cigerette   . Alcohol use 0.0 oz/week     Comment: rarely  . Drug use: No  . Sexual activity: Not on file   Other Topics Concern  . Not on file   Social History Narrative  . No narrative on file   Allergies  Allergen Reactions  . Metformin And Related     Made pt really sick  . Codeine Rash   Family History  Problem Relation Age of Onset  . Hypertension Father   . Diabetes Father   . Lung cancer Father   . Kidney failure Father     Living  . Hypertension Mother [id number]    Deceased  . Diabetes Mother   . Mental illness Mother     Schizophrenia  . Stroke Mother   . Bipolar disorder Mother   . Diabetes Sister     Deceased  . Stroke Sister   . Mental illness Brother     Deceased  . Colon cancer Paternal Grandfather   . Diabetes Other  Maternal/Paternal side  . Hypertension Other     Maternal/Paternal side  . Multiple sclerosis Maternal Aunt   . Hypertension Sister   . Healthy Son     x2    Past medical history, social, surgical and family history all reviewed in electronic medical record.  No pertanent information unless stated regarding to the chief complaint.   Review of Systems: No headache, visual changes, nausea, vomiting, diarrhea, constipation, dizziness, abdominal pain, skin rash, fevers, chills, night sweats, weight loss, swollen lymph nodes, body aches, joint swelling, muscle aches, chest pain, shortness of breath, mood changes.   Objective  There were no vitals taken for this visit.  General: No apparent distress alert and oriented x3 mood and affect normal, dressed appropriately.  HEENT: Pupils  equal, extraocular movements intact  Respiratory: Patient's speak in full sentences and does not appear short of breath  Cardiovascular: No lower extremity edema, non tender, no erythema  Skin: Warm dry intact with no signs of infection or rash on extremities or on axial skeleton.  Abdomen: Soft nontender  Neuro: Cranial nerves II through XII are intact, neurovascularly intact in all extremities with 2+ DTRs and 2+ pulses.  Lymph: No lymphadenopathy of posterior or anterior cervical chain or axillae bilaterally.  Gait normal with good balance and coordination.  MSK:  Non tender with full range of motion and good stability and symmetric strength and tone of shoulders, elbows, wrist, hip, knee and ankles bilaterally.  Neck: Inspection unremarkable. No palpable stepoffs. Negative Spurling's maneuver. Lacks last 5 of extension and right-sided side bending Grip strength and sensation normal in bilateral hands Strength good C4 to T1 distribution No sensory change to C4 to T1 Negative Hoffman sign bilaterally Reflexes normal  Osteopathic findings:  C2 flexed rotated and side bent right T3 extended rotated and side bent left T7 extended rotated and side bent right L2 flexed rotated and side bent right Sacrum left on left   Impression and Recommendations:     This case required medical decision making of moderate complexity.      Note: This dictation was prepared with Dragon dictation along with smaller phrase technology. Any transcriptional errors that result from this process are unintentional.

## 2015-11-18 ENCOUNTER — Ambulatory Visit (INDEPENDENT_AMBULATORY_CARE_PROVIDER_SITE_OTHER): Payer: 59 | Admitting: Family Medicine

## 2015-11-18 ENCOUNTER — Encounter: Payer: Self-pay | Admitting: Family Medicine

## 2015-11-18 DIAGNOSIS — M542 Cervicalgia: Secondary | ICD-10-CM

## 2015-11-18 DIAGNOSIS — M9901 Segmental and somatic dysfunction of cervical region: Secondary | ICD-10-CM | POA: Diagnosis not present

## 2015-11-18 DIAGNOSIS — M9902 Segmental and somatic dysfunction of thoracic region: Secondary | ICD-10-CM | POA: Diagnosis not present

## 2015-11-18 DIAGNOSIS — M999 Biomechanical lesion, unspecified: Secondary | ICD-10-CM

## 2015-11-18 DIAGNOSIS — M9903 Segmental and somatic dysfunction of lumbar region: Secondary | ICD-10-CM | POA: Diagnosis not present

## 2015-11-18 HISTORY — DX: Biomechanical lesion, unspecified: M99.9

## 2015-11-18 MED ORDER — GABAPENTIN 100 MG PO CAPS: 200.0000 mg | ORAL_CAPSULE | Freq: Every day | ORAL | 3 refills | Status: DC

## 2015-11-18 MED ORDER — VITAMIN D (ERGOCALCIFEROL) 1.25 MG (50000 UNIT) PO CAPS: 50000.0000 [IU] | ORAL_CAPSULE | ORAL | 0 refills | Status: DC

## 2015-11-18 MED FILL — GABAPENTIN 100 MG CAPSULE: 100 | 30 days supply | Qty: 60 | Fill #0

## 2015-11-18 MED FILL — VIT D2 1.25 MG (50,000 UNIT: 1.25 MG | 84 days supply | Qty: 12 | Fill #0

## 2015-11-18 NOTE — Assessment & Plan Note (Signed)
Decision today to treat with OMT was based on Physical Exam  After verbal consent patient was treated with HVLA, ME, FPR techniques in cervical, thoracic and lumbar areas  Patient tolerated the procedure well with improvement in symptoms  Patient given exercises, stretches and lifestyle modifications  See medications in patient instructions if given  Patient will follow up in 3-4 weeks                     

## 2015-11-18 NOTE — Patient Instructions (Signed)
Good to see you  Ice 20 minutes 2 times daily. Usually after activity and before bed. Exercises 3 times a week.  On wall with heels, butt shoulder and head touching for a goal of 5 minutes daily  When at computer try to hold tennis ball between shoulder blades When tightness of neck take 2 tennis balls and put them in a tube sock and lay on them at base of skull Gabapentin 200mg  at night.   Once weekly vitamin D for next 12 weeks Turmeric 500mg  2 times daily for inflammation  pennsaid pinkie amount topically 2 times daily as needed.  Exercises 3 times a week on the handouts but alternate See me again in 3-4 weeks.

## 2015-11-18 NOTE — Assessment & Plan Note (Signed)
Very mild arthritic changes on x-ray. Patient does have poor posture. We discussed ergonomics, proper lifting techniques, started on gabapentin low dose with patient also having a history of headaches. We discussed icing regimen. Patient responded well to osteopathic manipulation. Patient will come back and see me again in 3-4 weeks for further evaluation and treatment. Worsening symptoms consider formal physical therapy or Effexor.

## 2015-11-20 DIAGNOSIS — N200 Calculus of kidney: Secondary | ICD-10-CM | POA: Diagnosis not present

## 2015-11-20 DIAGNOSIS — N23 Unspecified renal colic: Secondary | ICD-10-CM | POA: Diagnosis not present

## 2015-11-20 DIAGNOSIS — N3000 Acute cystitis without hematuria: Secondary | ICD-10-CM | POA: Diagnosis not present

## 2015-12-02 ENCOUNTER — Encounter: Payer: Self-pay | Admitting: *Deleted

## 2015-12-02 NOTE — Patient Outreach (Signed)
Shelley Garcia missed her [facility] appointment on 7/25 at 1:00  p.m.so a letter will be mailed today requesting she  schedule  [facility] appointment by 9/15 or risk termination from the program.  Barrington Ellison RN,CCM,CDE [facility] Coordinator [facility] Office Phone [phone] Office Fax [phone]

## 2015-12-05 DIAGNOSIS — R0602 Shortness of breath: Secondary | ICD-10-CM | POA: Diagnosis not present

## 2015-12-05 DIAGNOSIS — R05 Cough: Secondary | ICD-10-CM | POA: Diagnosis not present

## 2015-12-05 DIAGNOSIS — T7840XA Allergy, unspecified, initial encounter: Secondary | ICD-10-CM | POA: Diagnosis not present

## 2015-12-05 DIAGNOSIS — R1110 Vomiting, unspecified: Secondary | ICD-10-CM | POA: Diagnosis not present

## 2015-12-05 DIAGNOSIS — R21 Rash and other nonspecific skin eruption: Secondary | ICD-10-CM | POA: Diagnosis not present

## 2015-12-05 DIAGNOSIS — L539 Erythematous condition, unspecified: Secondary | ICD-10-CM | POA: Diagnosis not present

## 2015-12-05 DIAGNOSIS — L299 Pruritus, unspecified: Secondary | ICD-10-CM | POA: Diagnosis not present

## 2015-12-08 ENCOUNTER — Telehealth: Payer: 59 | Admitting: Family

## 2015-12-08 DIAGNOSIS — J069 Acute upper respiratory infection, unspecified: Secondary | ICD-10-CM | POA: Diagnosis not present

## 2015-12-08 MED ORDER — BENZONATATE 100 MG PO CAPS: 100.0000 mg | ORAL_CAPSULE | Freq: Three times a day (TID) | ORAL | 0 refills | Status: DC | PRN

## 2015-12-08 MED ORDER — AZITHROMYCIN 250 MG PO TABS: ORAL_TABLET | ORAL | 0 refills | Status: DC

## 2015-12-08 NOTE — Progress Notes (Signed)

## 2015-12-10 ENCOUNTER — Ambulatory Visit: Payer: 59 | Admitting: Family Medicine

## 2015-12-11 ENCOUNTER — Encounter: Payer: Self-pay | Admitting: Family Medicine

## 2015-12-11 ENCOUNTER — Ambulatory Visit (INDEPENDENT_AMBULATORY_CARE_PROVIDER_SITE_OTHER): Payer: 59 | Admitting: Family Medicine

## 2015-12-11 VITALS — BP 112/70 | HR 85 | Temp 98.1°F | Ht 63.5 in | Wt 226.6 lb

## 2015-12-11 DIAGNOSIS — Z8709 Personal history of other diseases of the respiratory system: Secondary | ICD-10-CM | POA: Diagnosis not present

## 2015-12-11 DIAGNOSIS — J209 Acute bronchitis, unspecified: Secondary | ICD-10-CM

## 2015-12-11 MED ORDER — PREDNISONE 50 MG PO TABS: 50.0000 mg | ORAL_TABLET | Freq: Every day | ORAL | 0 refills | Status: DC

## 2015-12-11 MED ORDER — ALBUTEROL SULFATE HFA 108 (90 BASE) MCG/ACT IN AERS: 2.0000 | INHALATION_SPRAY | Freq: Four times a day (QID) | RESPIRATORY_TRACT | 2 refills | Status: DC | PRN

## 2015-12-11 MED FILL — predniSONE 50 MG TABS: 50 | 5 days supply | Qty: 5 | Fill #0

## 2015-12-11 MED FILL — VENTOLIN HFA 90 MCG INHALER: 108 (90 BAS | 25 days supply | Qty: 18 | Fill #0

## 2015-12-11 NOTE — Patient Instructions (Signed)
Practice good hand hygiene.

## 2015-12-11 NOTE — Progress Notes (Signed)
Pre visit review using our clinic review tool, if applicable. No additional management support is needed unless otherwise documented below in the visit note. 

## 2015-12-11 NOTE — Progress Notes (Signed)
Chief Complaint  Patient presents with  . Cough    (product-yellow) and wheezing-pt was placed on Z-pak and tessalon perles along with Mucinex    Shelley Garcia is 50 y.o. and is here for a cough.  Duration: 6 days Productive? Yes   Improving on the Z-pak, would like to make sure she has a plan after she is finished with it. Associated symptoms: dyspnea Denies: fever, nasal congestion, rhinorrhea, sore throat Hx of GERD? Yes ACEi? Yes  +hx of asthma, former smoker.  ROS:  Resp: +Cough Cardio: No chest pain  Past Medical History:  Diagnosis Date  . Diabetes mellitus   . GERD (gastroesophageal reflux disease)   . History of chicken pox   . Hyperlipidemia   . Hypertension   . Obesity   . Vitamin B12 deficiency   . Vitamin D deficiency    Family History  Problem Relation Age of Onset  . Hypertension Father   . Diabetes Father   . Lung cancer Father   . Kidney failure Father     Living  . Hypertension Mother 9    Deceased  . Diabetes Mother   . Mental illness Mother     Schizophrenia  . Stroke Mother   . Bipolar disorder Mother   . Diabetes Sister     Deceased  . Stroke Sister   . Mental illness Brother     Deceased  . Colon cancer Paternal Grandfather   . Diabetes Other     Maternal/Paternal side  . Hypertension Other     Maternal/Paternal side  . Multiple sclerosis Maternal Aunt   . Hypertension Sister   . Healthy Son     x2     Medication List       Accurate as of 12/11/15  9:25 AM. Always use your most recent med list.          albuterol 108 (90 Base) MCG/ACT inhaler Commonly known as:  PROVENTIL HFA;VENTOLIN HFA Inhale 2 puffs into the lungs every 6 (six) hours as needed for wheezing or shortness of breath.   aspirin EC 81 MG tablet Take 81 mg by mouth daily.   azithromycin 250 MG tablet Commonly known as:  ZITHROMAX Take 2 tabs today then 1 daily times 4 days.   benzonatate 100 MG capsule Commonly known as:  TESSALON PERLES Take  1-2 capsules (100-200 mg total) by mouth every 8 (eight) hours as needed.   canagliflozin 300 MG Tabs tablet Commonly known as:  INVOKANA Take 1 tablet (300 mg total) by mouth daily before breakfast.   cholecalciferol 1000 units tablet Commonly known as:  VITAMIN D Take 2,000 Units by mouth daily.   cyanocobalamin 1000 MCG/ML injection Commonly known as:  (VITAMIN B-12) Inject 1 mL (1,000 mcg total) into the muscle every 30 (thirty) days.   cyclobenzaprine 10 MG tablet Commonly known as:  FLEXERIL Take 1 tablet (10 mg total) by mouth 3 (three) times daily as needed for muscle spasms.   Exenatide ER 2 MG Pen Inject 2 mg into the skin once a week.   gabapentin 100 MG capsule Commonly known as:  NEURONTIN Take 2 capsules (200 mg total) by mouth at bedtime.   glucose blood test strip Check 2x a day   Insulin Degludec 200 UNIT/ML Sopn Commonly known as:  TRESIBA FLEXTOUCH Inject 60 Units into the skin daily. Titrating dosage, Max daily is 80 units   multivitamin tablet Take 1 tablet by mouth daily. Reported on 07/22/2015  naproxen 500 MG tablet Commonly known as:  NAPROSYN Take 1 tablet (500 mg total) by mouth 2 (two) times daily with a meal.   onetouch ultrasoft lancets Check 2x a day   pantoprazole 40 MG tablet Commonly known as:  PROTONIX Take 1 tablet (40 mg total) by mouth daily.   Pitavastatin Calcium 4 MG Tabs Commonly known as:  LIVALO Take 1 tablet (4 mg total) by mouth daily.   predniSONE 50 MG tablet Commonly known as:  DELTASONE Take 1 tablet (50 mg total) by mouth daily with breakfast.   ramipril 2.5 MG capsule Commonly known as:  ALTACE Take 1 capsule (2.5 mg total) by mouth daily.   Vitamin D (Ergocalciferol) 50000 units Caps capsule Commonly known as:  DRISDOL Take 1 capsule (50,000 Units total) by mouth every 7 (seven) days.   zolpidem 10 MG tablet Commonly known as:  AMBIEN Take 1/2 by mouth at night       BP 112/70 (BP Location: Left  Arm, Patient Position: Sitting, Cuff Size: Large)   Pulse 85   Temp 98.1 F (36.7 C) (Oral)   Ht 5' 3.5" (1.613 m)   Wt 226 lb 9.6 oz (102.8 kg)   LMP 11/29/2015 (Approximate)   SpO2 97%   BMI 39.51 kg/m  Gen: Awake, alert, appears stated age HEENT: Ears neg, nares patent without D/C, turbinates unremarkable, Pharynx pink without exudate Neck: Supple, no masses or asymmetry, no tenderness Heart: RRR, no murmurs, no LE edema Lungs: +end expiratory wheezes diffusely, normal effort, no accessory muscle use Psych: Age appropriate judgement and insight, normal mood and affect  Acute wheezy bronchitis - Plan: predniSONE (DELTASONE) 50 MG tablet, albuterol (PROVENTIL HFA;VENTOLIN HFA) 108 (90 Base) MCG/ACT inhaler  History of asthma - Plan: albuterol (PROVENTIL HFA;VENTOLIN HFA) 108 (90 Base) MCG/ACT inhaler  Orders as above. As of today, I am not convinced she has a bacterial infection necessitating further antibiotics. No current dyspnea, so breathing treatment in office was not offered.  Reminded how to most efficiently use inhaler by holding in front of face instead of putting directly into mouth in the absence of a spacer.  F/u if symptoms fail to improve. The patient voiced understanding and agreement to the plan.  [place], DO 12/11/15 9:25 AM

## 2015-12-23 ENCOUNTER — Encounter: Payer: Self-pay | Admitting: *Deleted

## 2015-12-23 ENCOUNTER — Other Ambulatory Visit: Payer: Self-pay | Admitting: *Deleted

## 2015-12-23 NOTE — Patient Outreach (Addendum)
[facility]) Care Management   12/23/2015  AUBRIE LUCIEN 10/11/1965 [id number]  Shelley Garcia is an 50 y.o. female who presents to the [place] [facility] office for routine Link To Wellness follow up for self management assistance with Type II DM, HTN,  hyperlipidemia and obesity.   Subjective:  Cheryll says she is still recovering from a very serious bout of an acute respiratory infection with bronchitis that started her on 9/11 with symptoms of persistent cough, wheezing, and dyspnea that required treatment with a rescue inhaler, antibiotics and oral steroids. Blondine attributes her weight loss to stopping all of diabetes medications including  insulin. She says the insulin is the reason she continues to gain weight.  She voices extreme frustration with years of unsuccessful diabetes management and feels all the medications she has tried are ineffective in reaching optimal glycemic control.    Objective:   Review of Systems  Constitutional: Negative.     Physical Exam  Constitutional: She is oriented to person, place, and time. She appears well-developed and well-nourished.  Respiratory: Effort normal.  Neurological: She is alert and oriented to person, place, and time.  Skin: Skin is warm and dry.  Psychiatric: She has a normal mood and affect. Her behavior is normal. Judgment and thought content normal.   Filed Weights   12/23/15 1055  Weight: 224 lb 3.2 oz (101.7 kg)   Encounter Medications:   Outpatient Encounter Prescriptions as of 07/22/2015  Medication Sig Note  . aspirin EC 81 MG tablet Take 81 mg by mouth daily.   . canagliflozin (INVOKANA) 300 MG TABS tablet Take 1 tablet (300 mg total) by mouth daily before breakfast.   . cholecalciferol (VITAMIN D) 1000 units tablet Take 2,000 Units by mouth daily.   . Exenatide ER 2 MG PEN Inject 2 mg into the skin once a week. 07/22/2015: Tries to take on Friday  . gabapentin  (NEURONTIN) 300 MG capsule Take 1 capsule (300 mg total) by mouth at bedtime. 07/22/2015: Takes prn  . Insulin Degludec (TRESIBA FLEXTOUCH) 200 UNIT/ML SOPN Inject 50 Units into the skin daily. She is now taking 60 units twice daily  . pantoprazole (PROTONIX) 40 MG tablet Take 1 tablet (40 mg total) by mouth daily.   . Pitavastatin Calcium (LIVALO) 4 MG TABS Take 1 tablet (4 mg total) by mouth daily.   . ramipril (ALTACE) 2.5 MG capsule Take 1 capsule (2.5 mg total) by mouth daily.   Marland Kitchen zolpidem (AMBIEN) 10 MG tablet Take 1/2 by mouth at night   . glucose blood test strip Check 2x a day   . Lancets (ONETOUCH ULTRASOFT) lancets Check 2x a day   . Multiple Vitamin (MULTIVITAMIN) tablet Take 1 tablet by mouth daily. Reported on 07/22/2015   . naproxen (NAPROSYN) 500 MG tablet Take 1 tablet (500 mg total) by mouth 2 (two) times daily with a meal.    No facility-administered encounter medications on file as of 07/22/2015.    Functional Status:   In your present state of health, do you have any difficulty performing the following activities: 07/22/2015 03/11/2015  Hearing? N N  Vision? N N  Difficulty concentrating or making decisions? N N  Walking or climbing stairs? N N  Dressing or bathing? N N  Doing errands, shopping? N N  Some recent data might be hidden    Fall/Depression Screening:    PHQ 2/9 Scores 03/11/2015 07/26/2013  PHQ - 2 Score 2 0  PHQ- 9 Score 9 -    Assessment:   [place] RN and Link To Wellness member with morbid obesity, Type II DM, HTN and hyperlipidemia meeting treatment targets for HTN and lipid control. Has stopped all DM medications due to weight gain and her perception that the medications are not effective in getting her blood sugars to target..  Plan:  THN CM Care Plan Problem One        Most Recent Value   Care Plan Problem One  Member with morbid obesity (BMI= 38.6) , Type II DM, HTN and hyperlipidemia. Lipid panel on 05/19/15 normal. BP readings meeting  treatment target of <140/<90. Hgb A1C= 8.6% on 07/22/15- currently not taking DM medications due to ongoing weight gain and frustration of chronically elevated blood sugars despite numerous DM medications   Role Documenting the Problem One  Care Management [place] for Problem One  Active   THN Long Term Goal (31-90 days)  Patient will consider discussing pump therapy with her primary care provider,  ongoing normal lipid profile, BP readings will continue to meet treatment targets of <140/<90 and evidence of weight loss or no weight gain   THN Long Term Goal Start Date     THN Long Term Goal Met Date     Interventions for Problem One Long Term Goal  reviewed medications and medication adherence, discussed barriers to DM medication adherence, discussed ramifications of untreated high blood sugars including risk of ketoacidosis, assisted Milliani with determining her current out of pocket medical expenses total to see if she can afford metabolic surgery, since her cost is still too high, discussed treatment options of insulin pump and the VGo insulin delivery system which Dr. Cruzita Lederer had proposed as a possible treatment option, Brayla says she will call Dr Cruzita Lederer and make a follow up appointment, will arrange for Link To Wellness follow up in December     RNCM to fax today's office visit note to [facility] PA-C and [place]. RNCM will meet quarterly and as needed with patient per Link To Wellness program guidelines to assist with Type II DM, HTN,hyperlipidemia and weight self-management and assess patient's progress toward mutually set goals.  Barrington Ellison RN,CCM,CDE [place] Management Coordinator Link To Wellness Office Phone [phone] Office Fax [phone]

## 2016-01-15 ENCOUNTER — Telehealth: Payer: 59 | Admitting: Physician Assistant

## 2016-01-15 DIAGNOSIS — J40 Bronchitis, not specified as acute or chronic: Secondary | ICD-10-CM

## 2016-01-15 MED ORDER — PROMETHAZINE-DM 6.25-15 MG/5ML PO SYRP: 5.0000 mL | ORAL_SOLUTION | Freq: Four times a day (QID) | ORAL | 0 refills | Status: DC | PRN

## 2016-01-15 MED ORDER — DOXYCYCLINE HYCLATE 100 MG PO CAPS: 100.0000 mg | ORAL_CAPSULE | Freq: Two times a day (BID) | ORAL | 0 refills | Status: DC

## 2016-01-15 NOTE — Progress Notes (Signed)
We are sorry that you are not feeling well.  Here is how we plan to help!  Based on what you have shared with me it looks like you have upper respiratory tract inflammation that has resulted in a significant cough.  Inflammation and infection in the upper respiratory tract is commonly called bronchitis and has four common causes:  Allergies, Viral Infections, Acid Reflux and Bacterial Infections.  Allergies, viruses and acid reflux are treated by controlling symptoms or eliminating the cause. An example might be a cough caused by taking certain blood pressure medications. You stop the cough by changing the medication. Another example might be a cough caused by acid reflux. Controlling the reflux helps control the cough.  Based on your presentation I believe you most likely have A cough due to bacteria.  When patients have a fever and a productive cough with a change in color or increased sputum production, we are concerned about bacterial bronchitis.  If left untreated it can progress to pneumonia.  If your symptoms do not improve with your treatment plan it is important that you contact your provider.   I hve prescribed Doxycycline 100 mg twice a day for 7 days    In addition you may use A non-prescription cough medication called Mucinex DM: take 2 tablets every 12 hours.     HOME CARE . Only take medications as instructed by your medical team. . Complete the entire course of an antibiotic. . Drink plenty of fluids and get plenty of rest. . Avoid close contacts especially the very young and the elderly . Cover your mouth if you cough or cough into your sleeve. . Always remember to wash your hands . A steam or ultrasonic humidifier can help congestion.    GET HELP RIGHT AWAY IF: . You develop worsening fever. . You become short of breath . You cough up blood. . Your symptoms persist after you have completed your treatment plan MAKE SURE YOU   Understand these instructions.  Will watch your  condition.  Will get help right away if you are not doing well or get worse.  Your e-visit answers were reviewed by a board certified advanced clinical practitioner to complete your personal care plan.  Depending on the condition, your plan could have included both over the counter or prescription medications. If there is a problem please reply  once you have received a response from your provider. Your safety is important to [place].  If you have drug allergies check your prescription carefully.    You can use MyChart to ask questions about today's visit, request a non-urgent call back, or ask for a work or school excuse for 24 hours related to this e-Visit. If it has been greater than 24 hours you will need to follow up with your provider, or enter a new e-Visit to address those concerns. You will get an e-mail in the next two days asking about your experience.  I hope that your e-visit has been valuable and will speed your recovery. Thank you for using e-visits.

## 2016-01-15 NOTE — Addendum Note (Signed)
Addended by: Raiford Noble on: 01/15/2016 09:51 AM   Modules accepted: Orders

## 2016-02-06 ENCOUNTER — Ambulatory Visit: Payer: 59 | Admitting: Neurology

## 2016-02-17 ENCOUNTER — Telehealth: Payer: 59 | Admitting: Family

## 2016-02-17 DIAGNOSIS — R05 Cough: Secondary | ICD-10-CM | POA: Diagnosis not present

## 2016-02-17 DIAGNOSIS — J302 Other seasonal allergic rhinitis: Secondary | ICD-10-CM

## 2016-02-17 DIAGNOSIS — R059 Cough, unspecified: Secondary | ICD-10-CM

## 2016-02-17 NOTE — Progress Notes (Signed)
We are sorry that you are not feeling well.  Here is how we plan to help!  Based on what you have shared with me it looks like you have upper respiratory tract inflammation that has resulted in a significant cough.  Inflammation and infection in the upper respiratory tract is commonly called bronchitis and has four common causes:  Allergies, Viral Infections, Acid Reflux and Bacterial Infections.  Allergies, viruses and acid reflux are treated by controlling symptoms or eliminating the cause. An example might be a cough caused by taking certain blood pressure medications. You stop the cough by changing the medication. Another example might be a cough caused by acid reflux. Controlling the reflux helps control the cough.  Based on your presentation I believe you most likely have A cough due to allergies.  I recommend that you start the an over-the counter-allergy medication such as Claritin 10 mg or Zyrtec 10 mg daily.     In addition you may use A non-prescription cough medication called Mucinex DM: take 2 tablets every 12 hours.    USE OF BRONCHODILATOR ("RESCUE") INHALERS: There is a risk from using your bronchodilator too frequently.  The risk is that over-reliance on a medication which only relaxes the muscles surrounding the breathing tubes can reduce the effectiveness of medications prescribed to reduce swelling and congestion of the tubes themselves.  Although you feel brief relief from the bronchodilator inhaler, your asthma may actually be worsening with the tubes becoming more swollen and filled with mucus.  This can delay other crucial treatments, such as oral steroid medications. If you need to use a bronchodilator inhaler daily, several times per day, you should discuss this with your provider.  There are probably better treatments that could be used to keep your asthma under control.     HOME CARE . Only take medications as instructed by your medical team. . Complete the entire course of  an antibiotic. . Drink plenty of fluids and get plenty of rest. . Avoid close contacts especially the very young and the elderly . Cover your mouth if you cough or cough into your sleeve. . Always remember to wash your hands . A steam or ultrasonic humidifier can help congestion.   GET HELP RIGHT AWAY IF: . You develop worsening fever. . You become short of breath . You cough up blood. . Your symptoms persist after you have completed your treatment plan MAKE SURE YOU   Understand these instructions.  Will watch your condition.  Will get help right away if you are not doing well or get worse.  Your e-visit answers were reviewed by a board certified advanced clinical practitioner to complete your personal care plan.  Depending on the condition, your plan could have included both over the counter or prescription medications. If there is a problem please reply  once you have received a response from your provider. Your safety is important to us.  If you have drug allergies check your prescription carefully.    You can use MyChart to ask questions about today's visit, request a non-urgent call back, or ask for a work or school excuse for 24 hours related to this e-Visit. If it has been greater than 24 hours you will need to follow up with your provider, or enter a new e-Visit to address those concerns. You will get an e-mail in the next two days asking about your experience.  I hope that your e-visit has been valuable and will speed your recovery. Thank   you for using e-visits.   

## 2016-05-18 ENCOUNTER — Other Ambulatory Visit: Payer: Self-pay | Admitting: Physician Assistant

## 2016-05-18 DIAGNOSIS — E1165 Type 2 diabetes mellitus with hyperglycemia: Secondary | ICD-10-CM

## 2016-05-18 DIAGNOSIS — E785 Hyperlipidemia, unspecified: Secondary | ICD-10-CM

## 2016-05-18 DIAGNOSIS — E118 Type 2 diabetes mellitus with unspecified complications: Secondary | ICD-10-CM

## 2016-05-18 DIAGNOSIS — K219 Gastro-esophageal reflux disease without esophagitis: Secondary | ICD-10-CM

## 2016-05-18 DIAGNOSIS — Z794 Long term (current) use of insulin: Secondary | ICD-10-CM

## 2016-05-18 DIAGNOSIS — IMO0002 Reserved for concepts with insufficient information to code with codable children: Secondary | ICD-10-CM

## 2016-05-18 MED FILL — LIVALO 4 MG TABLET: 4 | 30 days supply | Qty: 30 | Fill #0

## 2016-05-18 MED FILL — PANTOPRAZOLE SOD DR 40 MG T: 40 | 30 days supply | Qty: 30 | Fill #0

## 2016-05-18 MED FILL — RAMIPRIL 2.5 MG CAPSULE: 2.5 | 30 days supply | Qty: 30 | Fill #0

## 2016-05-21 ENCOUNTER — Ambulatory Visit (INDEPENDENT_AMBULATORY_CARE_PROVIDER_SITE_OTHER): Payer: 59 | Admitting: Family

## 2016-05-21 ENCOUNTER — Encounter: Payer: Self-pay | Admitting: Family

## 2016-05-21 VITALS — BP 138/83 | HR 85 | Temp 98.3°F | Resp 16 | Ht 64.0 in | Wt 236.0 lb

## 2016-05-21 DIAGNOSIS — E785 Hyperlipidemia, unspecified: Secondary | ICD-10-CM | POA: Diagnosis not present

## 2016-05-21 DIAGNOSIS — Z6841 Body Mass Index (BMI) 40.0 and over, adult: Secondary | ICD-10-CM

## 2016-05-21 DIAGNOSIS — E6609 Other obesity due to excess calories: Secondary | ICD-10-CM | POA: Diagnosis not present

## 2016-05-21 DIAGNOSIS — Z794 Long term (current) use of insulin: Secondary | ICD-10-CM | POA: Diagnosis not present

## 2016-05-21 DIAGNOSIS — E118 Type 2 diabetes mellitus with unspecified complications: Secondary | ICD-10-CM

## 2016-05-21 DIAGNOSIS — E1165 Type 2 diabetes mellitus with hyperglycemia: Secondary | ICD-10-CM | POA: Diagnosis not present

## 2016-05-21 DIAGNOSIS — IMO0001 Reserved for inherently not codable concepts without codable children: Secondary | ICD-10-CM

## 2016-05-21 DIAGNOSIS — K219 Gastro-esophageal reflux disease without esophagitis: Secondary | ICD-10-CM

## 2016-05-21 DIAGNOSIS — IMO0002 Reserved for concepts with insufficient information to code with codable children: Secondary | ICD-10-CM

## 2016-05-21 LAB — LIPID PANEL
Cholesterol: 227 mg/dL — ABNORMAL HIGH (ref <200)
HDL: 42 mg/dL — ABNORMAL LOW (ref >50)
LDL Cholesterol: 142 mg/dL — ABNORMAL HIGH (ref <100)
TRIGLYCERIDES: 214 mg/dL — AB (ref <150)
Total CHOL/HDL Ratio: 5.4 Ratio — ABNORMAL HIGH (ref <5.0)
VLDL: 43 mg/dL — ABNORMAL HIGH (ref <30)

## 2016-05-21 LAB — BASIC METABOLIC PANEL
BUN: 11 mg/dL (ref 7–25)
CALCIUM: 9.3 mg/dL (ref 8.6–10.2)
CO2: 22 mmol/L (ref 20–31)
Chloride: 104 mmol/L (ref 98–110)
Creat: 0.64 mg/dL (ref 0.50–1.10)
Glucose, Bld: 271 mg/dL — ABNORMAL HIGH (ref 65–99)
Potassium: 4.3 mmol/L (ref 3.5–5.3)
Sodium: 134 mmol/L — ABNORMAL LOW (ref 135–146)

## 2016-05-21 LAB — HEMOGLOBIN A1C
HEMOGLOBIN A1C: 9.3 % — AB (ref <5.7)
Mean Plasma Glucose: 220 mg/dL

## 2016-05-21 MED ORDER — RAMIPRIL 2.5 MG PO CAPS: 2.5000 mg | ORAL_CAPSULE | Freq: Every day | ORAL | 0 refills | Status: DC

## 2016-05-21 MED ORDER — CANAGLIFLOZIN 300 MG PO TABS: ORAL_TABLET | ORAL | 0 refills | Status: DC

## 2016-05-21 MED ORDER — INSULIN GLARGINE 300 UNIT/ML ~~LOC~~ SOPN: 64.0000 [IU] | PEN_INJECTOR | Freq: Every day | SUBCUTANEOUS | 3 refills | Status: DC

## 2016-05-21 MED ORDER — PANTOPRAZOLE SODIUM 40 MG PO TBEC: 40.0000 mg | DELAYED_RELEASE_TABLET | Freq: Every day | ORAL | 0 refills | Status: DC

## 2016-05-21 MED ORDER — LIRAGLUTIDE -WEIGHT MANAGEMENT 18 MG/3ML ~~LOC~~ SOPN: 3.0000 mg | PEN_INJECTOR | Freq: Every day | SUBCUTANEOUS | 3 refills | Status: DC

## 2016-05-21 MED ORDER — PITAVASTATIN CALCIUM 4 MG PO TABS: 1.0000 | ORAL_TABLET | Freq: Every day | ORAL | 0 refills | Status: DC

## 2016-05-21 MED ORDER — LIRAGLUTIDE 18 MG/3ML ~~LOC~~ SOPN: 1.8000 mg | PEN_INJECTOR | SUBCUTANEOUS | 0 refills | Status: DC

## 2016-05-21 MED FILL — TOUJEO SOLOSTAR 300 UNITS/M: 300 | 21 days supply | Qty: 5 | Fill #0

## 2016-05-21 NOTE — Progress Notes (Signed)
Pre visit review using our clinic review tool, if applicable. No additional management support is needed unless otherwise documented below in the visit note. 

## 2016-05-21 NOTE — Patient Instructions (Signed)
Begin saxenda injection as follows:  .6 mg once daily for one week; increase by 0.6 mg daily at weekly intervals to a target dose of 3 mg once daily   Complete lab work prior to leaving. Stop sweet tea, limit carbs in your diet and get regular exercise. Check your fasting sugars once daily for the next week and contact me with your readings.

## 2016-05-21 NOTE — Assessment & Plan Note (Addendum)
Will change her tresiba to toujeo due to formulary change.  Keep upcoming appointment with endo.  Advised pt to check daily fasting sugars and send me her readings via mychart in 1 week. Obtain follow up A1C, lipids, bmet, urine microalbumin.

## 2016-05-21 NOTE — Progress Notes (Signed)
Subjective:    Patient ID: Shelley Garcia, female    DOB: [birth year], 51 y.o.   MRN: [id number]  HPI  Shelley Garcia is a 51 yr old female with history of obesity, and diabetes type 2 who presents today to discuss weight loss.  Her last appointment for follow up of her DM2 with her last provider was in June 2017. She was advised to follow up in 1 month at that time.  She has also been followed by endocrinology in the past but has not seen endo in 2 years.  She is not checking her sugars.  She reports that she is not exercising. She drinks 3+ glasses a day of sweet tea and does not eat healthy most of the time.  Her BMI is 40.51.   She is frustrated with her weight and wants a prescription for Qsymia to help her lose weight.   Wt Readings from Last 3 Encounters:  05/21/16 236 lb (107 kg)  12/23/15 224 lb 3.2 oz (101.7 kg)  12/11/15 226 lb 9.6 oz (102.8 kg)   DM2- reports that she works weekend nights.  Diet is sporadic She reports that she has an upcoming appointment with Dr. Cruzita Lederer.  Reports that some of her medications are not going to be covered by her insurance.   She is maintained on livalo.  Denies myalgia.  Lab Results  Component Value Date   CHOL 167 05/19/2015   HDL 42.30 05/19/2015   LDLCALC 97 05/19/2015   LDLDIRECT 201.4 02/06/2014   TRIG 136.0 05/19/2015   CHOLHDL 4 05/19/2015    Review of Systems    see HPI  Past Medical History:  Diagnosis Date  . Diabetes mellitus   . GERD (gastroesophageal reflux disease)   . History of chicken pox   . Hyperlipidemia   . Hypertension   . Obesity   . Vitamin B12 deficiency   . Vitamin D deficiency      Social History   Social History  . Marital status: Married    Spouse name: N/A  . Number of children: 2  . Years of education: N/A   Occupational History  . Nurse [employer]   Social History Main Topics  . Smoking status: Former Smoker    Packs/day: 1.00    Types: Cigarettes    Start date: 06/11/2014  .  Smokeless tobacco: Never Used     Comment: pt doing the vapor cigerette   . Alcohol use 0.0 oz/week     Comment: rarely  . Drug use: No  . Sexual activity: Not on file   Other Topics Concern  . Not on file   Social History Narrative  . No narrative on file    Past Surgical History:  Procedure Laterality Date  . CHOLECYSTECTOMY    . EXPLORATORY LAPAROTOMY    . KIDNEY STONE SURGERY    . WISDOM TOOTH EXTRACTION  07/31/2013    Family History  Problem Relation Age of Onset  . Hypertension Father   . Diabetes Father   . Lung cancer Father   . Kidney failure Father     Living  . Hypertension Mother 28    Deceased  . Diabetes Mother   . Mental illness Mother     Schizophrenia  . Stroke Mother   . Bipolar disorder Mother   . Diabetes Sister     Deceased  . Stroke Sister   . Mental illness Brother     Deceased  . Colon cancer  Paternal Grandfather   . Diabetes Other     Maternal/Paternal side  . Hypertension Other     Maternal/Paternal side  . Multiple sclerosis Maternal Aunt   . Hypertension Sister   . Healthy Son     x2    Allergies  Allergen Reactions  . Metformin And Related     Made pt really sick  . Codeine Rash    Current Outpatient Prescriptions on File Prior to Visit  Medication Sig Dispense Refill  . albuterol (PROVENTIL HFA;VENTOLIN HFA) 108 (90 Base) MCG/ACT inhaler Inhale 2 puffs into the lungs every 6 (six) hours as needed for wheezing or shortness of breath. 1 Inhaler 2  . aspirin EC 81 MG tablet Take 81 mg by mouth daily.    . cholecalciferol (VITAMIN D) 1000 units tablet Take 2,000 Units by mouth daily.    . cyanocobalamin (,VITAMIN B-12,) 1000 MCG/ML injection Inject 1 mL (1,000 mcg total) into the muscle every 30 (thirty) days. 10 mL 0  . cyclobenzaprine (FLEXERIL) 10 MG tablet Take 1 tablet (10 mg total) by mouth 3 (three) times daily as needed for muscle spasms. 30 tablet 0  . doxycycline (VIBRAMYCIN) 100 MG capsule Take 1 capsule (100 mg  total) by mouth 2 (two) times daily. 14 capsule 0  . Exenatide ER 2 MG PEN Inject 2 mg into the skin once a week. 12 each 1  . gabapentin (NEURONTIN) 100 MG capsule Take 2 capsules (200 mg total) by mouth at bedtime. 60 capsule 3  . glucose blood test strip Check 2x a day 100 each 11  . Insulin Degludec (TRESIBA FLEXTOUCH) 200 UNIT/ML SOPN Inject 60 Units into the skin daily. Titrating dosage, Max daily is 80 units 36 mL 1  . INVOKANA 300 MG TABS tablet TAKE 1 TABLET BY MOUTH ONCE DAILY BEFORE BREAKFAST 30 tablet 0  . Lancets (ONETOUCH ULTRASOFT) lancets Check 2x a day 100 each 11  . LIVALO 4 MG TABS TAKE 1 TABLET BY MOUTH ONCE DAILY 30 tablet 0  . Multiple Vitamin (MULTIVITAMIN) tablet Take 1 tablet by mouth daily. Reported on 07/22/2015    . naproxen (NAPROSYN) 500 MG tablet Take 1 tablet (500 mg total) by mouth 2 (two) times daily with a meal. 30 tablet 0  . pantoprazole (PROTONIX) 40 MG tablet TAKE 1 TABLET BY MOUTH ONCE DAILY 30 tablet 0  . promethazine-dextromethorphan (PROMETHAZINE-DM) 6.25-15 MG/5ML syrup Take 5 mLs by mouth 4 (four) times daily as needed for cough. 118 mL 0  . ramipril (ALTACE) 2.5 MG capsule TAKE 1 CAPSULE BY MOUTH ONCE DAILY 30 capsule 0  . Vitamin D, Ergocalciferol, (DRISDOL) 50000 units CAPS capsule Take 1 capsule (50,000 Units total) by mouth every 7 (seven) days. 12 capsule 0  . zolpidem (AMBIEN) 10 MG tablet Take 1/2 by mouth at night 15 tablet 2   No current facility-administered medications on file prior to visit.     BP 138/83 (BP Location: Right Arm, Patient Position: Sitting, Cuff Size: Large)   Pulse 85   Temp 98.3 F (36.8 C) (Oral)   Resp 16   Ht 5\' 4"  (1.626 m)   Wt 236 lb (107 kg)   SpO2 98%   BMI 40.51 kg/m    Objective:   Physical Exam  Constitutional: She is oriented to person, place, and time. She appears well-developed and well-nourished.  HENT:  Head: Normocephalic and atraumatic.  Cardiovascular: Normal rate, regular rhythm and  normal heart sounds.   No murmur heard. Pulmonary/Chest:  Effort normal and breath sounds normal. No respiratory distress. She has no wheezes.  Neurological: She is alert and oriented to person, place, and time.  Psychiatric: She has a normal mood and affect. Her behavior is normal. Judgment and thought content normal.          Assessment & Plan:

## 2016-05-21 NOTE — Assessment & Plan Note (Signed)
I advised pt that I was not comfortable prescribing her Qsymia. I did offer belviq and a referral to nutrition. She declined.  We also discussed referral to the medical weight loss clinic. She declined. 50 minutes spent with pt. >50% of this time was spent counseling patient on importance of a well balanced low carb diet and regular exercise as part of a weight loss regimen and for help managing  Her diabetes.  We ultimately ended up d/cing her bydureon and giving her an sample of Saxenda to help with sugar and weight loss.

## 2016-05-22 LAB — MICROALBUMIN / CREATININE URINE RATIO
CREATININE, URINE: 130 mg/dL (ref 20–320)
MICROALB UR: 0.4 mg/dL
Microalb Creat Ratio: 3 mcg/mg creat (ref <30)

## 2016-05-24 ENCOUNTER — Telehealth: Payer: Self-pay | Admitting: Family

## 2016-05-24 DIAGNOSIS — E785 Hyperlipidemia, unspecified: Secondary | ICD-10-CM

## 2016-05-24 NOTE — Telephone Encounter (Signed)
Shelley Garcia-- I received 3 faxes from the pharmacy regarding medication prior authorizations.   Invokana is not covered. Insurance prefers: metformin, a sulfonylurea, pioglitazone or a combination product containing any 2 of the above mentioned medications.  [name] is not covered. Insurance prefers Lantus.  [name] is not covered and will require PA.  Please advise?

## 2016-05-24 NOTE — Telephone Encounter (Signed)
Please let pt know that her sugars are uncontrolled.  A1C is 9.3. The addition of saxenda should help with her sugars.  Please ask her to send me her fasting sugars in 1 week via mychart.  Cholesterol is quite high.  Has she been taking livalo regularly?  If so we should change her to crestor 20mg  once daily and repeat lipids in 6 weeks.

## 2016-05-25 MED ORDER — PIOGLITAZONE HCL 30 MG PO TABS: 30.0000 mg | ORAL_TABLET | Freq: Every day | ORAL | 3 refills | Status: DC

## 2016-05-25 NOTE — Telephone Encounter (Signed)
Notified pt of results and medication changes/ PA. Sent rx for actos. Pt states she has a new glucometer and will need test strips advised pt to call back or send my chart message with name of meter. Pt stated she just restarted taking livalo 2 weeks ago and will call back to schedule lab appointment. Future lab order entered. Verified with pharmacy that toujeo was covered through insurance. Called [phone] And requested PA form for saxenda. Awaiting fax

## 2016-05-25 NOTE — Telephone Encounter (Signed)
D/c invokana,start across 30 mg P.O. daily.#30 with 3 refills. Let's work on [employer] for [employer]. I gave her sample to start.

## 2016-05-28 ENCOUNTER — Other Ambulatory Visit: Payer: Self-pay | Admitting: Physician Assistant

## 2016-05-28 DIAGNOSIS — E1165 Type 2 diabetes mellitus with hyperglycemia: Secondary | ICD-10-CM

## 2016-05-28 DIAGNOSIS — E118 Type 2 diabetes mellitus with unspecified complications: Principal | ICD-10-CM

## 2016-05-28 DIAGNOSIS — Z794 Long term (current) use of insulin: Principal | ICD-10-CM

## 2016-05-28 DIAGNOSIS — IMO0002 Reserved for concepts with insufficient information to code with codable children: Secondary | ICD-10-CM

## 2016-05-31 ENCOUNTER — Other Ambulatory Visit: Payer: Self-pay | Admitting: Family

## 2016-05-31 MED ORDER — GLUCOSE BLOOD VI STRP: ORAL_STRIP | 6 refills | Status: DC

## 2016-06-01 ENCOUNTER — Telehealth: Payer: Self-pay | Admitting: *Deleted

## 2016-06-01 MED ORDER — FREESTYLE LITE DEVI: 0 refills | Status: AC

## 2016-06-01 MED ORDER — GLUCOSE BLOOD VI STRP: ORAL_STRIP | 5 refills | Status: DC

## 2016-06-01 MED ORDER — FREESTYLE LANCETS MISC: 5 refills | Status: AC

## 2016-06-01 MED FILL — FREESTYLE LANCETS: 33 days supply | Qty: 100 | Fill #0

## 2016-06-01 MED FILL — FREESTYLE LITE TEST STRIP: 33 days supply | Qty: 100 | Fill #0

## 2016-06-01 MED FILL — FREESTYLE LITE METER: 1 days supply | Qty: 1 | Fill #0

## 2016-06-01 NOTE — Telephone Encounter (Signed)
Received fax from [place] stating insurance will cover freestyle lite meter and lancets. Rxs sent.

## 2016-06-03 NOTE — Telephone Encounter (Signed)
PA initiated via Covermymeds; KEY: [id number]. Awaiting determination.

## 2016-06-09 NOTE — Telephone Encounter (Addendum)
Yes I was holding form because below documentation indicated that PA had been initiated via covermymeds. Called [employer] at [phone] and spoke with CSR, Magda Paganini. Reference # [id number].  1.  Is pt currently enrolled in exercise / calorie reduction program.-- No per 05/21/16 office note.  2.  Is pt currently taking a GLP-1 receoptor agonist (victoza, byetta, bydureon or Tanzeum)-- bydureon  3.  Has pt had previous trial of other weight loss medication (contrave, belviq, belviq XR, Qsymia, Xenical, phentermine, phendimetrazine, benzphetamine, diethylpropion)-- no, per 05/21/16 office note.  Also advised Magda Paganini that we are requesting this medication for diabetes as well as obesity. Provided BMI of 40.5, hgb a1c 9.3% and pt has tried glipizide, lantus, invokana, toujeo, glimepiride and bydureon. Answers have been submitted for review and should receive a determination within 48 hours. Awaiting determination.

## 2016-06-09 NOTE — Telephone Encounter (Signed)
Shelley Garcia you seen form for this?

## 2016-06-09 NOTE — Telephone Encounter (Signed)
[employer] called in to give provider an update on authorization. Representative stated to call [employer] directly at [phone] and answer a few questions and they will go ahead and complete request. She says that they previously sent over a form but still haven't received back.

## 2016-06-14 NOTE — Telephone Encounter (Signed)
Received denial from [employer] for saxenda because:  1-- pt has not tried a trial of previous weight loss medication such as contrave, belviq, belviq XR, Qsymia, Xenical, phentermine, phendimetrazine, benzphetamine or diethylpropion  2-- the patient is not currently taking a GLP-1 receptor agonist (victoza, byetta, bydureon, tanzeum)  3--evidence of active enrollment in exercise and caloric reduction program or weight loss/behavioral modification     program.

## 2016-06-18 NOTE — Telephone Encounter (Signed)
Please let patient know that her insurance has denied saxenda.  It is unclear, but it looks like they may cover Belviq. I am willing to write rx for belviq if she would like to try but will need to sign controlled substance contract and provide UDS if she proceeds.

## 2016-06-18 NOTE — Telephone Encounter (Signed)
Tried to reach pt left message to call back.  

## 2016-06-21 NOTE — Telephone Encounter (Signed)
Attempted to reach pt and left message to check mychart message. Message sent.

## 2016-06-21 NOTE — Addendum Note (Signed)
Addended by: Kelle Darting A on: 06/21/2016 03:03 PM   Modules accepted: Orders

## 2016-06-22 ENCOUNTER — Other Ambulatory Visit: Payer: Self-pay | Admitting: *Deleted

## 2016-06-22 NOTE — Patient Outreach (Signed)
Sent secure e-mail to [facility] address and text to Licia's mobile number requesting she contact this RNCM to arrange Link To Wellness follow up. Await response from Rennie.  Barrington Ellison RN,CCM,CDE [place] Management Coordinator Link To Wellness Office Phone [phone] Office Fax [phone] mobile number

## 2016-06-29 ENCOUNTER — Telehealth: Payer: Self-pay | Admitting: Medical

## 2016-06-29 NOTE — Telephone Encounter (Signed)
See 05/24/16 phone note.

## 2016-06-29 NOTE — Telephone Encounter (Signed)
   Mackie Pai, PA-C Physician Assistant Signed  Creation Time: 06/29/2016 3:46 PM      On review of pt chart looks like saxenda and belviq not covered. qsymia is combination of phentermine and topiramate. I am little cautious on phentermine use in light of can increase bp and increase heart rate. It is also a controlled medication. I don't mind refilling meds when provider is out. But decision to start controlled meds to fill regular basis I feel should be started by pcp. So we could get back with her after pcp reviews early next week.

## 2016-06-29 NOTE — Telephone Encounter (Signed)
On review of pt chart looks like saxenda and belviq not covered. qsymia is combination of phentermine and topiramate. I am little cautious on phentermine use in light of can increase bp and increase heart rate. It is also a controlled medication. I don't mind refilling meds when provider is out. But decision to start controlled meds to fill regular basis I feel should be started by pcp. So we could get back with her after pcp reviews early next week.

## 2016-06-29 NOTE — Telephone Encounter (Signed)
Shelley Garcia-- please advise in [name]'s absence re: alternative for belviq?  See above mychart message from pt.

## 2016-06-30 ENCOUNTER — Encounter: Payer: Self-pay | Admitting: *Deleted

## 2016-06-30 NOTE — Patient Outreach (Signed)
Shelley Garcia did not respond to e-mail sent to her [facility] email address or to a text sent to her mobile number on 06/22/16 so a letter composed today that will mailed to her home address advising her to schedule Link To Wellness follow up within 10 business days of receiving letter or she will be terminated form the Link To Wellness program with loss of program pharmacy benefits. Barrington Ellison RN,CCM,CDE [place] Management Coordinator Link To Wellness Office Phone [phone] Office Fax [phone]

## 2016-07-03 NOTE — Telephone Encounter (Signed)
Please let patient know that as we discussed at her visit, I am not comfortable prescribing qsymia.  I would encourage her to continue her dietary efforts and add exercise 5 days a week if she is not already exercising.  In addition, I am happy to refer her to the weight loss clinic.

## 2016-07-06 NOTE — Telephone Encounter (Addendum)
Sent mychart response to pt.   Debbrah Alar, NP      9:58 AM  Note    Please let patient know that as we discussed at her visit, I am not comfortable prescribing qsymia.  I would encourage her to continue her dietary efforts and add exercise 5 days a week if she is not already exercising.  In addition, I am happy to refer her to the weight loss clinic.

## 2016-07-06 NOTE — Telephone Encounter (Signed)
See 05/24/16 phone note.

## 2016-07-07 ENCOUNTER — Encounter: Payer: Self-pay | Admitting: Internal Medicine

## 2016-07-07 ENCOUNTER — Other Ambulatory Visit: Payer: Self-pay

## 2016-07-07 ENCOUNTER — Ambulatory Visit (INDEPENDENT_AMBULATORY_CARE_PROVIDER_SITE_OTHER): Payer: 59 | Admitting: Internal Medicine

## 2016-07-07 VITALS — BP 124/80 | HR 83 | Wt 230.0 lb

## 2016-07-07 DIAGNOSIS — E1165 Type 2 diabetes mellitus with hyperglycemia: Secondary | ICD-10-CM

## 2016-07-07 DIAGNOSIS — Z794 Long term (current) use of insulin: Secondary | ICD-10-CM

## 2016-07-07 DIAGNOSIS — E118 Type 2 diabetes mellitus with unspecified complications: Secondary | ICD-10-CM | POA: Diagnosis not present

## 2016-07-07 DIAGNOSIS — Z6839 Body mass index (BMI) 39.0-39.9, adult: Secondary | ICD-10-CM | POA: Diagnosis not present

## 2016-07-07 DIAGNOSIS — IMO0001 Reserved for inherently not codable concepts without codable children: Secondary | ICD-10-CM

## 2016-07-07 DIAGNOSIS — E669 Obesity, unspecified: Secondary | ICD-10-CM

## 2016-07-07 DIAGNOSIS — IMO0002 Reserved for concepts with insufficient information to code with codable children: Secondary | ICD-10-CM

## 2016-07-07 MED ORDER — EMPAGLIFLOZIN 25 MG PO TABS: 25.0000 mg | ORAL_TABLET | Freq: Every day | ORAL | 5 refills | Status: DC

## 2016-07-07 MED ORDER — DULAGLUTIDE 0.75 MG/0.5ML ~~LOC~~ SOAJ: 0.7500 mg | SUBCUTANEOUS | 1 refills | Status: DC

## 2016-07-07 NOTE — Patient Instructions (Addendum)
Please continue: - Lantus 32 units at bedtime  Please start Trulicity 8.44 mg weekly. Let me know when you are close to running out to call in the higher dose to your pharmacy (1.5 mg).  Start Jardiance 25 mg before b'fast.  Please return in 3 months with your sugar log.

## 2016-07-07 NOTE — Progress Notes (Signed)
Patient ID: Shelley Garcia, female   DOB: 06/12/1965, 51 y.o.   MRN: [id number]  HPI: Shelley Garcia is a 51 y.o.-year-old female, returning for f/u for DM2, dx 2010, insulin-dependent since 2011, uncontrolled, with complications (cerebrovascular ds).   Last visit 02/2014!  Last hemoglobin A1c was: Lab Results  Component Value Date   HGBA1C 9.3 (H) 05/21/2016   HGBA1C 8.6 07/22/2015   HGBA1C 9.8 (H) 05/19/2015  07/2013 (before 07/31/2013): 8.8% Prev. HbA1c "always around 10%".  At last visit, she was on: - Lantus 30 units at bedtime - Glipizide 10 mg before your 2 meals of the day - started 01/2014 - Januvia 100 mg before first meal of the day - started 01/2014  Tried Invokana >> yeast infections  >> retried this since last visit  - no yeast inf. >> but insurance stopped coverage Tried Bydureon >> itching at the inj site and rash on face >> restarted it since last visit but insurance stopped coverage Tried Metformin >> N/V/D/AP. Does not want to retry, not even XR. Stopped Glimepiride 4 mg daily.  In 2017 and up until 04/2016: - Tresiba 64 units daily - Invokana 300 mg daily - Bydureon 2 g weekly  Since then, due to insurance coverage: - Lantus 32 units daily PCP suggested Actos - did not start yet.  Pt checks her sugars 0-1x a day >> better after starting a ketogenic diet: - fasting: 230-240 >> 180-200 >> 100, 140-150s - later: 280s >> 250-300s >> <210 after meals No lows. Lowest sugar was 112 >> low 200s >> 180 ; ? she has hypoglycemia awareness Highest sugar was 400s >> 180 >> 303 >> 300s >> 210.   - no CKD, last BUN/creatinine:  Lab Results  Component Value Date   BUN 11 05/21/2016   CREATININE 0.64 05/21/2016  On Lisinopril.  - last set of lipids: Lab Results  Component Value Date   CHOL 227 (H) 05/21/2016   HDL 42 (L) 05/21/2016   LDLCALC 142 (H) 05/21/2016   LDLDIRECT 201.4 02/06/2014   TRIG 214 (H) 05/21/2016   CHOLHDL 5.4 (H) 05/21/2016  She stopped  Zocor b/c fatigue, weakness, fatigue. - last eye exam was in 2017. No DR.  - no numbness and tingling in her feet.  I reviewed pt's medications, allergies, PMH, social hx, family hx, and changes were documented in the history of present illness. Otherwise, unchanged from my initial visit note  ROS: Constitutional: + weight gain and loss, no fatigue, no subjective hyperthermia/hypothermia Eyes: no blurry vision, no xerophthalmia ENT: no sore throat, no nodules palpated in throat, no dysphagia/odynophagia, no hoarseness Cardiovascular: no CP/SOB/palpitations/no leg swelling Respiratory: no cough/SOB Gastrointestinal: no N/V/D/C/heartburn Musculoskeletal: no muscle/joint aches Skin: no rashes Neurological: no tremors/numbness/tingling/dizziness  I reviewed pt's medications, allergies, PMH, social hx, family hx, and changes were documented in the history of present illness. Otherwise, unchanged from my initial visit note.  Past Medical History:  Diagnosis Date  . Diabetes mellitus   . GERD (gastroesophageal reflux disease)   . History of chicken pox   . Hyperlipidemia   . Hypertension   . Obesity   . Vitamin B12 deficiency   . Vitamin D deficiency    Past Surgical History:  Procedure Laterality Date  . CHOLECYSTECTOMY    . EXPLORATORY LAPAROTOMY    . KIDNEY STONE SURGERY    . WISDOM TOOTH EXTRACTION  07/31/2013   Social History Main Topics  . Smoking status: Former Smoker    Packs/day: 1.00  Types: Cigarettes    Start date: 06/11/2014  . Smokeless tobacco: Never Used     Comment: pt doing the vapor cigerette   . Alcohol use 0.0 oz/week     Comment: rarely  . Drug use: No   Social History Narrative   Works an Therapist, sports in the [employer]   Married   2 grown children (2 grandchildren)   Enjoys reading   Current Outpatient Prescriptions on File Prior to Visit  Medication Sig Dispense Refill  . aspirin EC 81 MG tablet Take 81 mg by mouth daily.    . Blood Glucose Monitoring  Suppl (FREESTYLE LITE) DEVI Use as instructed 2 to 3 times daily to check blood sugar.  Dx E11.8 1 each 0  . cholecalciferol (VITAMIN D) 1000 units tablet Take 2,000 Units by mouth daily.    . cyanocobalamin (,VITAMIN B-12,) 1000 MCG/ML injection Inject 1 mL (1,000 mcg total) into the muscle every 30 (thirty) days. 10 mL 0  . glucose blood (FREESTYLE LITE) test strip Use as instructed to check blood sugar 2-3 times a day.  Dx  E11.8 100 each 5  . Lancets (FREESTYLE) lancets Use to check blood sugar 2-3 times daily.  Dx E11.8 100 each 5  . Multiple Vitamin (MULTIVITAMIN) tablet Take 1 tablet by mouth daily. Reported on 07/22/2015    . pantoprazole (PROTONIX) 40 MG tablet Take 1 tablet (40 mg total) by mouth daily. 30 tablet 0  . Pitavastatin Calcium (LIVALO) 4 MG TABS Take 1 tablet (4 mg total) by mouth daily. 30 tablet 0  . ramipril (ALTACE) 2.5 MG capsule Take 1 capsule (2.5 mg total) by mouth daily. 30 capsule 0  . zolpidem (AMBIEN) 10 MG tablet Take 1/2 by mouth at night 15 tablet 2  + Lantus 32 units at bedtime.  Allergies  Allergen Reactions  . Metformin And Related     Made pt really sick  . Codeine Rash   Family History  Problem Relation Age of Onset  . Hypertension Father   . Diabetes Father   . Lung cancer Father   . Kidney failure Father     Living  . Hypertension Mother 67    Deceased  . Diabetes Mother   . Mental illness Mother     Schizophrenia  . Stroke Mother   . Bipolar disorder Mother   . Diabetes Sister     Deceased  . Stroke Sister   . Mental illness Brother     Deceased  . Colon cancer Paternal Grandfather   . Diabetes Other     Maternal/Paternal side  . Hypertension Other     Maternal/Paternal side  . Multiple sclerosis Maternal Aunt   . Hypertension Sister   . Healthy Son     x2   PE: BP 124/80 (BP Location: Left Arm, Patient Position: Sitting)   Pulse 83   Wt 230 lb (104.3 kg)   LMP 05/17/2016   SpO2 97%   BMI 39.48 kg/m  Wt Readings from  Last 3 Encounters:  07/07/16 230 lb (104.3 kg)  05/21/16 236 lb (107 kg)  12/23/15 224 lb 3.2 oz (101.7 kg)   Constitutional: obese, in NAD Eyes: PERRLA, EOMI, no exophthalmos ENT: moist mucous membranes, no thyromegaly, no cervical lymphadenopathy Cardiovascular: RRR, No MRG Respiratory: CTA B Gastrointestinal: abdomen soft, NT, ND, BS+ Musculoskeletal: no deformities, strength intact in all 4 Skin: moist, warm, no rashes Neurological: no tremor with outstretched hands, DTR normal in all 4  ASSESSMENT:  1. DM2, insulin-dependent, uncontrolled, without complications - Cerebrovascular ds. ("ministrokes") - MRI not in the system (was seen by neurology) - per pt  2. Obesity  PLAN:  1. Patient with h/o uncontrolled diabetes, returning after a long absence. Sugars are worse and she is frustrated about weight gain. She started a ketogenic diet >> lost some weight, now plateaus. Sugars are better, though. Last HbA1c was higher, at 9.2%, however, this was also related to having to stop iNVOKANA and Bydureon due to lack of insurance coverage. Since then, she also had to come off [place] and is now using Lantus, at half the previous dose. Despite these changes, her sugars did improve on the new diet.  - at this visit, I suggested to try Jardiance and Trulicity >> she agrees - we discussed about a VGo pump before >> she agrees with this if needed in the near future >> this will be next option - I suggested to  Patient Instructions  Please continue: - Lantus 32 units at bedtime  Please start Trulicity 4.13 mg weekly. Let me know when you are close to running out to call in the higher dose to your pharmacy (1.5 mg).  Start Jardiance 25 mg before b'fast.  Please return in 3 months with your sugar log.   - continue checking sugars at different times of the day - check 1-2 times a day, rotating checks - advised  for yearly eye exams >> she is due! - Return to clinic in 3 mo with sugar log   2.  Obesity - Trulicity and Jardiance will help with weight loss - I also suggested a referral to the Weight Loss center to see Dr. Leafy Ro >> she will think about it - PCP tried Saxenda, but not covered by her insurance  Philemon Kingdom, MD PhD [facility] Endocrinology

## 2016-07-08 ENCOUNTER — Other Ambulatory Visit: Payer: Self-pay

## 2016-07-08 MED ORDER — EMPAGLIFLOZIN 25 MG PO TABS: 25.0000 mg | ORAL_TABLET | Freq: Every day | ORAL | 5 refills | Status: DC

## 2016-07-08 MED ORDER — DULAGLUTIDE 0.75 MG/0.5ML ~~LOC~~ SOAJ: 0.7500 mg | SUBCUTANEOUS | 1 refills | Status: DC

## 2016-07-19 ENCOUNTER — Ambulatory Visit: Payer: 59 | Admitting: Family

## 2016-07-21 ENCOUNTER — Encounter: Payer: Self-pay | Admitting: *Deleted

## 2016-07-21 NOTE — Telephone Encounter (Signed)
Received notice that mychart message re: Qsymia has not been ready. Mailed letter.

## 2016-07-26 ENCOUNTER — Other Ambulatory Visit: Payer: Self-pay | Admitting: *Deleted

## 2016-07-26 NOTE — Patient Outreach (Signed)
Jakirah did not respond to a text left last week on her mobile number so left message on same number with request to start her Link To Wellness appointment at 1:15 or 1:30 pm to accommodate conflict in this RNCM's schedule. Await return call.  Barrington Ellison RN,CCM,CDE [place] Management Coordinator Link To Wellness Office Phone [phone] Office Fax [phone]

## 2016-07-27 ENCOUNTER — Encounter: Payer: Self-pay | Admitting: *Deleted

## 2016-07-27 ENCOUNTER — Ambulatory Visit: Payer: Self-pay | Admitting: *Deleted

## 2016-07-27 ENCOUNTER — Other Ambulatory Visit: Payer: Self-pay | Admitting: *Deleted

## 2016-07-27 VITALS — BP 122/76 | Ht 64.0 in | Wt 232.6 lb

## 2016-07-27 DIAGNOSIS — E118 Type 2 diabetes mellitus with unspecified complications: Secondary | ICD-10-CM

## 2016-07-27 DIAGNOSIS — Z794 Long term (current) use of insulin: Principal | ICD-10-CM

## 2016-07-27 LAB — POCT GLYCOSYLATED HEMOGLOBIN (HGB A1C): Hemoglobin A1C: 7.4

## 2016-07-27 MED FILL — JARDIANCE 25 MG TABLET: 25 | 30 days supply | Qty: 30 | Fill #0

## 2016-07-27 MED FILL — LIVALO 4 MG TABLET: 4 | 30 days supply | Qty: 30 | Fill #0

## 2016-07-27 MED FILL — PANTOPRAZOLE SOD DR 40 MG T: 40 | 30 days supply | Qty: 30 | Fill #0

## 2016-07-27 MED FILL — TRULICITY 0.75 MG/0.5 ML PE: 0.75 | 28 days supply | Qty: 2 | Fill #0

## 2016-07-27 NOTE — Patient Outreach (Addendum)
[facility] Care Management   07/27/2016  Shelley Garcia May 21, 1965 [id number]  Shelley Garcia is an 51 y.o. female who presents to the [place] Management office for routine Link To Wellness follow up for self management assistance with Type II DM, HTN,  hyperlipidemia and obesity.   Subjective:  Shelley Garcia says she started a ketogenic diet after seeing her primary care provider on 05/21/16 and her Hgb A1C was 9.3% She says she was incentivized because she feels she has tried everything else and she has a coworker who was successful in losing weight on the ketogenic diet and so out of desperation , she decided to try it. She says she feels much better and her blood sugars are improved but she says she has hit a weight loss plateau. She is requesting a POC Hgb A1C today.   Objective:   Review of Systems  Constitutional: Negative.     Physical Exam  Constitutional: She is oriented to person, place, and time. She appears well-developed and well-nourished.  Respiratory: Effort normal.  Neurological: She is alert and oriented to person, place, and time.  Skin: Skin is warm and dry.  Psychiatric: She has a normal mood and affect. Her behavior is normal. Judgment and thought content normal.   Filed Weights   07/27/16 1324  Weight: 232 lb 9.6 oz (105.5 kg)   BP= 122/76  POC random CBG= 216 POC  Hgb A1C= 7.4%   Outpatient Encounter Prescriptions as of 07/27/2016  Medication Sig Note  . aspirin EC 81 MG tablet Take 81 mg by mouth daily.   . Blood Glucose Monitoring Suppl (FREESTYLE LITE) DEVI Use as instructed 2 to 3 times daily to check blood sugar.  Dx E11.8   . Dulaglutide (TRULICITY) 5.37 SM/2.7MB SOPN Inject 0.75 mg into the skin once a week. States she will start today 07/27/16  . empagliflozin (JARDIANCE) 25 MG TABS tablet Take 25 mg by mouth daily. States she will start today 07/27/16  . glucose blood (FREESTYLE LITE) test strip Use as  instructed to check blood sugar 2-3 times a day.  Dx  E11.8    Insulin Glargine (LANTUS SOLOSTAR) 100 UNIT/ML Solostar Pen Inject 32 Units into the skin daily at 10 pm. 07/27/2016: Say she is taking 30 units now  . Lancets (FREESTYLE) lancets Use to check blood sugar 2-3 times daily.  Dx E11.8   . pantoprazole (PROTONIX) 40 MG tablet Take 1 tablet (40 mg total) by mouth daily.   . Pitavastatin Calcium (LIVALO) 4 MG TABS Take 1 tablet (4 mg total) by mouth daily.   . ramipril (ALTACE) 2.5 MG capsule Take 1 capsule (2.5 mg total) by mouth daily.   . Multiple Vitamin (MULTIVITAMIN) tablet Take 1 tablet by mouth daily. Reported on 07/22/2015        Depression screen [facility] 2/9 07/27/2016 03/11/2015 07/26/2013  Decreased Interest 1 1 0  Down, Depressed, Hopeless 0 1 0  PHQ - 2 Score 1 2 0  Altered sleeping - 3 -  Tired, decreased energy - 3 -  Change in appetite - 0 -  Feeling bad or failure about yourself  - 0 -  Trouble concentrating - 0 -  Moving slowly or fidgety/restless - 0 -  Suicidal thoughts - 1 -  PHQ-9 Score - 9 -    Assessment:   [place] RN and Link To Wellness member with morbid obesity, Type II DM, HTN and hyperlipidemia meeting treatment  targets for HTN. Currently on ketogenic diet with improved blood sugars and Hgb A1C.  Plan:  [facility] CM Care Plan Problem One        Most Recent Value   Care Plan Problem One  Member with morbid obesity (BMI= 40.0) , Type II DM, HTN and hyperlipidemia. Lipid panel on 05/25/16 with all elements abnormal, previously normal.  BP readings meeting treatment target of <140/90, POC Hgb A1C significantly improved at 7.4% today, previously 9.3%   Role Documenting the Problem One  Care Management [place] for Problem One  Active   [facility] Long Term Goal (31-90 days)  Patient will demonstrate ongoing improved glycemic management as evidenced by Hgb A1C meeting target of <7.0%, patient will attend the [facility]'s information  session, and she will keep follow up Link To wellness appointment and appointments with providers   [facility] Long Term Goal Start Date     [facility] Long Term Goal Met Date     Interventions for Problem One Long Term Goal  reviewed medications and medication adherence, discussed ketogenic diet, discussed [facility] and benefit coverage and will ask Dr. Cruzita Lederer for a referral, assessed POC CBG and Hgb A1C - discussed results and targets, reviewed upcoming appointment with Dr. Cruzita Lederer and arranged for Link To Wellness follow up     RNCM to fax today's office visit note to patient's primary care provider and Dr.Gherghe. RNCM will meet quarterly and as needed with patient per Link To Wellness program guidelines to assist with Type II DM, HTN,hyperlipidemia and weight self-management and assess patient's progress toward mutually set goals.  Barrington Ellison RN,CCM,CDE [place] Management Coordinator Link To Wellness Office Phone [phone] Office Fax [phone]

## 2016-07-29 ENCOUNTER — Other Ambulatory Visit: Payer: Self-pay | Admitting: Internal Medicine

## 2016-07-29 DIAGNOSIS — E118 Type 2 diabetes mellitus with unspecified complications: Principal | ICD-10-CM

## 2016-07-29 DIAGNOSIS — IMO0001 Reserved for inherently not codable concepts without codable children: Secondary | ICD-10-CM

## 2016-07-29 DIAGNOSIS — E1165 Type 2 diabetes mellitus with hyperglycemia: Secondary | ICD-10-CM

## 2016-07-29 DIAGNOSIS — IMO0002 Reserved for concepts with insufficient information to code with codable children: Secondary | ICD-10-CM

## 2016-07-29 DIAGNOSIS — Z794 Long term (current) use of insulin: Principal | ICD-10-CM

## 2016-08-25 ENCOUNTER — Other Ambulatory Visit: Payer: Self-pay | Admitting: Family

## 2016-08-25 DIAGNOSIS — E785 Hyperlipidemia, unspecified: Secondary | ICD-10-CM

## 2016-08-25 DIAGNOSIS — K219 Gastro-esophageal reflux disease without esophagitis: Secondary | ICD-10-CM

## 2016-08-25 MED FILL — PANTOPRAZOLE SOD DR 40 MG T: 40 | 30 days supply | Qty: 30 | Fill #0

## 2016-08-25 MED FILL — RAMIPRIL 2.5 MG CAPSULE: 2.5 | 30 days supply | Qty: 30 | Fill #0

## 2016-08-25 MED FILL — LIVALO 4 MG TABLET: 4 | 30 days supply | Qty: 30 | Fill #0

## 2016-08-25 MED FILL — TRULICITY 0.75 MG/0.5 ML PE: 0.75 | 28 days supply | Qty: 2 | Fill #1

## 2016-08-25 MED FILL — JARDIANCE 25 MG TABLET: 25 | 30 days supply | Qty: 30 | Fill #1

## 2016-10-07 ENCOUNTER — Encounter (INDEPENDENT_AMBULATORY_CARE_PROVIDER_SITE_OTHER): Payer: 59 | Admitting: Family Medicine

## 2016-10-12 ENCOUNTER — Encounter (INDEPENDENT_AMBULATORY_CARE_PROVIDER_SITE_OTHER): Payer: Self-pay | Admitting: Family Medicine

## 2016-10-12 ENCOUNTER — Ambulatory Visit (INDEPENDENT_AMBULATORY_CARE_PROVIDER_SITE_OTHER): Payer: 59 | Admitting: Family Medicine

## 2016-10-12 VITALS — BP 121/80 | HR 66 | Temp 98.2°F | Ht 64.0 in | Wt 225.0 lb

## 2016-10-12 DIAGNOSIS — R0600 Dyspnea, unspecified: Secondary | ICD-10-CM

## 2016-10-12 DIAGNOSIS — Z6838 Body mass index (BMI) 38.0-38.9, adult: Secondary | ICD-10-CM

## 2016-10-12 DIAGNOSIS — Z1389 Encounter for screening for other disorder: Secondary | ICD-10-CM | POA: Diagnosis not present

## 2016-10-12 DIAGNOSIS — Z1331 Encounter for screening for depression: Secondary | ICD-10-CM

## 2016-10-12 DIAGNOSIS — R5383 Other fatigue: Secondary | ICD-10-CM

## 2016-10-12 DIAGNOSIS — IMO0001 Reserved for inherently not codable concepts without codable children: Secondary | ICD-10-CM

## 2016-10-12 DIAGNOSIS — E669 Obesity, unspecified: Secondary | ICD-10-CM | POA: Diagnosis not present

## 2016-10-12 DIAGNOSIS — R0609 Other forms of dyspnea: Secondary | ICD-10-CM

## 2016-10-12 DIAGNOSIS — Z0289 Encounter for other administrative examinations: Secondary | ICD-10-CM

## 2016-10-12 DIAGNOSIS — E119 Type 2 diabetes mellitus without complications: Secondary | ICD-10-CM | POA: Diagnosis not present

## 2016-10-12 HISTORY — DX: Type 2 diabetes mellitus without complications: E11.9

## 2016-10-12 HISTORY — DX: Other forms of dyspnea: R06.09

## 2016-10-12 HISTORY — DX: Dyspnea, unspecified: R06.00

## 2016-10-12 HISTORY — DX: Other fatigue: R53.83

## 2016-10-12 NOTE — Progress Notes (Signed)
Office: [phone]  /  Fax: [phone]   Dear Dr. Cruzita Lederer,   Thank you for referring Shelley Garcia to our clinic. The following note includes my evaluation and treatment recommendations.  HPI:   Chief Complaint: OBESITY  GLORIANNA GOTT has been referred by Philemon Kingdom, MD for consultation regarding her obesity and obesity related comorbidities.  SANTANNA WHITFORD (MR# [id number]) is a 51 y.o. female who presents on 10/12/2016 for obesity evaluation and treatment. Current BMI is Body mass index is 38.62 kg/m.Marland Kitchen Rochelle has struggled with obesity for years and has been unsuccessful in either losing weight or maintaining long term weight loss. Jimma attended our information session and states she is currently in the action stage of change and ready to dedicate time achieving and maintaining a healthier weight.  Amybeth states her family eats meals together her desired weight loss is 83 lbs she started gaining weight 1st pregnancy her heaviest weight ever was 260 lbs. she has significant food cravings issues  she snacks frequently in the evenings she skips meals frequently she has problems with excessive hunger  she frequently eats larger portions than normal  she has binge eating behaviors she struggles with emotional eating    Fatigue Sky feels her energy is lower than it should be. This has worsened with weight gain and has not worsened recently. Dominika admits to daytime somnolence and  admits to waking up still tired. Patient is at risk for obstructive sleep apnea. Patent has a history of symptoms of daytime fatigue. Patient generally gets 7 or 8 hours of sleep per night, and states they generally have restful sleep. Snoring is present. Apneic episodes are not present. Epworth Sleepiness Score is 3  Dyspnea on exertion Winter notes increasing shortness of breath with exercising and seems to be worsening over time with weight gain. She notes getting out of breath sooner with  activity than she used to. This has not gotten worse recently. Kaliyah denies orthopnea.  Diabetes II Zeniah has a diagnosis of diabetes type II. She is on trulicity and jardiance and last A1c was 7.4 while on the keto diet, had been in the 9 range recently. Yareth denies any hypoglycemic episodes. She has been working on intensive lifestyle modifications including diet, exercise, and weight loss to help control her blood glucose levels.   Depression Screen Keilynn's Food and Mood (modified PHQ-9) score was  Depression screen PHQ 2/9 10/12/2016  Decreased Interest 1  Down, Depressed, Hopeless 1  PHQ - 2 Score 2  Altered sleeping 1  Tired, decreased energy 3  Change in appetite 1  Feeling bad or failure about yourself  1  Trouble concentrating 3  Moving slowly or fidgety/restless 1  Suicidal thoughts 0  PHQ-9 Score 12    ALLERGIES: Allergies  Allergen Reactions  . Metformin And Related     Made pt really sick  . Codeine Rash    MEDICATIONS: Current Outpatient Prescriptions on File Prior to Visit  Medication Sig Dispense Refill  . aspirin EC 81 MG tablet Take 81 mg by mouth daily.    . Blood Glucose Monitoring Suppl (FREESTYLE LITE) DEVI Use as instructed 2 to 3 times daily to check blood sugar.  Dx E11.8 1 each 0  . Dulaglutide (TRULICITY) 5.27 PO/2.4MP SOPN Inject 0.75 mg into the skin once a week. 4 pen 1  . empagliflozin (JARDIANCE) 25 MG TABS tablet Take 25 mg by mouth daily. 30 tablet 5  . glucose blood (FREESTYLE LITE) test strip  Use as instructed to check blood sugar 2-3 times a day.  Dx  E11.8 100 each 5  . Insulin Glargine (LANTUS SOLOSTAR) 100 UNIT/ML Solostar Pen Inject 32 Units into the skin daily at 10 pm.    . Lancets (FREESTYLE) lancets Use to check blood sugar 2-3 times daily.  Dx E11.8 100 each 5  . pantoprazole (PROTONIX) 40 MG tablet TAKE 1 TABLET BY MOUTH ONCE DAILY (MAKE APPT FOR FURTHER REFILLS) 30 tablet 5  . Pitavastatin Calcium (LIVALO) 4 MG TABS Take 1  tablet (4 mg total) by mouth daily. 30 tablet 5  . ramipril (ALTACE) 2.5 MG capsule Take 1 capsule (2.5 mg total) by mouth daily. 30 capsule 0  . zolpidem (AMBIEN) 10 MG tablet Take 1/2 by mouth at night 15 tablet 2  . cholecalciferol (VITAMIN D) 1000 units tablet Take 2,000 Units by mouth daily.    . cyanocobalamin (,VITAMIN B-12,) 1000 MCG/ML injection Inject 1 mL (1,000 mcg total) into the muscle every 30 (thirty) days. (Patient not taking: Reported on 07/27/2016) 10 mL 0  . Multiple Vitamin (MULTIVITAMIN) tablet Take 1 tablet by mouth daily. Reported on 07/22/2015     No current facility-administered medications on file prior to visit.     PAST MEDICAL HISTORY: Past Medical History:  Diagnosis Date  . Back pain   . Diabetes mellitus   . Gallbladder problem   . GERD (gastroesophageal reflux disease)   . History of chicken pox   . Hyperlipidemia   . Hypertension   . Joint pain   . Obesity   . Vitamin B12 deficiency   . Vitamin D deficiency     PAST SURGICAL HISTORY: Past Surgical History:  Procedure Laterality Date  . CHOLECYSTECTOMY    . EXPLORATORY LAPAROTOMY    . KIDNEY STONE SURGERY    . WISDOM TOOTH EXTRACTION  07/31/2013    SOCIAL HISTORY: Social History  Substance Use Topics  . Smoking status: Former Smoker    Packs/day: 1.00    Types: Cigarettes    Start date: 06/11/2014  . Smokeless tobacco: Never Used     Comment: pt doing the vapor cigerette   . Alcohol use 0.0 oz/week     Comment: rarely    FAMILY HISTORY: Family History  Problem Relation Age of Onset  . Hypertension Father   . Diabetes Father   . Lung cancer Father   . Kidney failure Father        Living  . Hypertension Mother 39       Deceased  . Diabetes Mother   . Mental illness Mother        Schizophrenia  . Stroke Mother   . Bipolar disorder Mother   . Depression Mother   . Anxiety disorder Mother   . Obesity Mother   . Diabetes Sister        Deceased  . Stroke Sister   . Mental  illness Brother        Deceased  . Colon cancer Paternal Grandfather   . Diabetes Other        Maternal/Paternal side  . Hypertension Other        Maternal/Paternal side  . Multiple sclerosis Maternal Aunt   . Hypertension Sister   . Healthy Son        x2    ROS: Review of Systems  Constitutional: Positive for malaise/fatigue.  HENT:       Dentures  Eyes:       Wear Glasses  or Contacts  Respiratory: Positive for shortness of breath (on exertion).   Cardiovascular: Negative for orthopnea.  Gastrointestinal: Positive for heartburn and nausea.  Genitourinary: Positive for frequency.  Musculoskeletal: Positive for back pain and neck pain.       Muscle or Joint Pain  Skin:       dryness  Endo/Heme/Allergies:       Polyphagia Negative hypoglycemia  Psychiatric/Behavioral:       Stress    PHYSICAL EXAM: Blood pressure 121/80, pulse 66, temperature 98.2 F (36.8 C), temperature source Oral, height 5\' 4"  (1.626 m), weight 225 lb (102.1 kg), SpO2 97 %. Body mass index is 38.62 kg/m. Physical Exam  Constitutional: She appears well-developed and well-nourished.  Cardiovascular: Normal rate.   Pulmonary/Chest: Effort normal.  Musculoskeletal: Normal range of motion.  Skin: Skin is warm and dry.  Psychiatric: She has a normal mood and affect. Her behavior is normal.  Vitals reviewed.   RECENT LABS AND TESTS: BMET    Component Value Date/Time   NA 134 (L) 05/21/2016 1532   K 4.3 05/21/2016 1532   CL 104 05/21/2016 1532   CO2 22 05/21/2016 1532   GLUCOSE 271 (H) 05/21/2016 1532   BUN 11 05/21/2016 1532   CREATININE 0.64 05/21/2016 1532   CALCIUM 9.3 05/21/2016 1532   GFRNONAA >90 05/27/2012 2345   GFRAA >90 05/27/2012 2345   Lab Results  Component Value Date   HGBA1C 7.4 07/27/2016   No results found for: INSULIN CBC    Component Value Date/Time   WBC 12.6 (H) 05/27/2012 2345   RBC 4.79 05/27/2012 2345   HGB 15.2 (H) 05/27/2012 2345   HCT 43.6 05/27/2012  2345   PLT 318 05/27/2012 2345   MCV 91.0 05/27/2012 2345   MCV 94.1 05/19/2011 1522   MCH 31.7 05/27/2012 2345   MCHC 34.9 05/27/2012 2345   RDW 12.5 05/27/2012 2345   LYMPHSABS 3.1 05/27/2012 2345   MONOABS 0.8 05/27/2012 2345   EOSABS 0.2 05/27/2012 2345   BASOSABS 0.0 05/27/2012 2345   Iron/TIBC/Ferritin/ %Sat No results found for: IRON, TIBC, FERRITIN, IRONPCTSAT Lipid Panel     Component Value Date/Time   CHOL 227 (H) 05/21/2016 1532   TRIG 214 (H) 05/21/2016 1532   HDL 42 (L) 05/21/2016 1532   CHOLHDL 5.4 (H) 05/21/2016 1532   VLDL 43 (H) 05/21/2016 1532   LDLCALC 142 (H) 05/21/2016 1532   LDLDIRECT 201.4 02/06/2014 1139   Hepatic Function Panel     Component Value Date/Time   PROT 7.4 05/19/2015 0817   ALBUMIN 4.2 05/19/2015 0817   AST 17 05/19/2015 0817   ALT 17 05/19/2015 0817   ALKPHOS 52 05/19/2015 0817   BILITOT 0.3 05/19/2015 0817      Component Value Date/Time   TSH 3.734 05/17/2012 0917    ECG  shows NSR with a rate of 70 BPM INDIRECT CALORIMETER done today shows a VO2 of 196 and a REE of 1364.    ASSESSMENT AND PLAN: Other fatigue - Plan: EKG 12-Lead, CBC with Differential/Platelet, Comprehensive metabolic panel, Lipid Panel With LDL/HDL Ratio, VITAMIN D 25 Hydroxy (Vit-D Deficiency, Fractures), Vitamin B12, Folate, TSH, T4, free, T3  Dyspnea on exertion  Type 2 diabetes mellitus without complication, without long-term current use of insulin (HCC) - Plan: Hemoglobin A1c, Insulin, random, Microalbumin / creatinine urine ratio  Depression screening  Class 2 obesity with serious comorbidity and body mass index (BMI) of 38.0 to 38.9 in adult, unspecified obesity type  PLAN:  Fatigue Kamali was informed that her fatigue may be related to obesity, depression or many other causes. Labs will be ordered, and in the meanwhile Brynli has agreed to work on diet, exercise and weight loss to help with fatigue. Proper sleep hygiene was discussed including the  need for 7-8 hours of quality sleep each night. A sleep study was not ordered based on symptoms and Epworth score.  Dyspnea on exertion Danett's shortness of breath appears to be obesity related and exercise induced. She has agreed to work on weight loss and gradually increase exercise to treat her exercise induced shortness of breath. If Aleka follows our instructions and loses weight without improvement of her shortness of breath, we will plan to refer to pulmonology. We will monitor this condition regularly. Shyasia agrees to this plan.  Diabetes II Jeffery has been given extensive diabetes education by myself today including ideal fasting and post-prandial blood glucose readings, individual ideal Hgb A1c goals and hypoglycemia prevention. We discussed the importance of good blood sugar control to decrease the likelihood of diabetic complications such as nephropathy, neuropathy, limb loss, blindness, coronary artery disease, and death. We discussed the importance of intensive lifestyle modification including diet, exercise and weight loss as the first line treatment for diabetes. We will check labs and Ashantae agrees to continue with diet and exercise. She agrees to continue her diabetes medications and will follow up at the agreed upon time.  Depression Screen Noreene had a moderately positive depression screening. Depression is commonly associated with obesity and often results in emotional eating behaviors. We will monitor this closely and work on CBT to help improve the non-hunger eating patterns. Referral to Psychology may be required if no improvement is seen as she continues in our clinic.  Obesity Ryllie is currently in the action stage of change and her goal is to continue with weight loss efforts. I recommend Aveleen begin the structured treatment plan as follows:  She has agreed to follow the Category 2 plan Clio has been instructed to eventually work up to a goal of 150 minutes of combined cardio  and strengthening exercise per week for weight loss and overall health benefits. We discussed the following Behavioral Modification Strategies today: meal planning & cooking strategies, increasing lean protein intake and dealing with family or coworker sabotage  Akyia has agreed to join our obesity program and follow up with our clinic in 2 weeks. She was informed of the importance of frequent follow up visits to maximize her success with intensive lifestyle modifications for her multiple health conditions. She was informed we would discuss her lab results at her next visit unless there is a critical issue that needs to be addressed sooner. Fanchon agreed to keep her next visit at the agreed upon time to discuss these results.  I, Doreene Nest, am acting as transcriptionist for Dennard Nip, MD  I have reviewed the above documentation for accuracy and completeness, and I agree with the above. -Dennard Nip, MD   OBESITY BEHAVIORAL INTERVENTION VISIT  Today's visit was # 1 out of 15.  Starting weight: 225 lbs Starting date: 10/12/16 Today's weight : 225 lbs Today's date: 10/12/2016 Total lbs lost to date: 0 (Patients must lose 7 lbs in the first 6 months to continue with counseling)   ASK: We discussed the diagnosis of obesity with Starling Manns today and Rosenda agreed to give [place] permission to discuss obesity behavioral modification therapy today.  ASSESS: Catalena has the diagnosis of obesity and her  BMI today is 38.7 Ciara is in the action stage of change   ADVISE: Chelesea was educated on the multiple health risks of obesity as well as the benefit of weight loss to improve her health. She was advised of the need for long term treatment and the importance of lifestyle modifications.  AGREE: Multiple dietary modification options and treatment options were discussed and  Akayla agreed to follow the Category 2 plan We discussed the following Behavioral Modification Strategies today: meal  planning & cooking strategies, increasing lean protein intake and dealing with family or coworker sabotage

## 2016-10-14 LAB — COMPREHENSIVE METABOLIC PANEL
ALT: 21 IU/L (ref 0–32)
AST: 17 IU/L (ref 0–40)
Albumin/Globulin Ratio: 1.4 (ref 1.2–2.2)
Albumin: 4 g/dL (ref 3.5–5.5)
Alkaline Phosphatase: 56 IU/L (ref 39–117)
BILIRUBIN TOTAL: 0.3 mg/dL (ref 0.0–1.2)
BUN/Creatinine Ratio: 18 (ref 9–23)
BUN: 11 mg/dL (ref 6–24)
CALCIUM: 9.4 mg/dL (ref 8.7–10.2)
CHLORIDE: 101 mmol/L (ref 96–106)
CO2: 18 mmol/L — ABNORMAL LOW (ref 20–29)
Creatinine, Ser: 0.62 mg/dL (ref 0.57–1.00)
GFR calc non Af Amer: 106 mL/min/{1.73_m2} (ref >59)
GFR, EST AFRICAN AMERICAN: 122 mL/min/{1.73_m2} (ref >59)
GLUCOSE: 234 mg/dL — AB (ref 65–99)
Globulin, Total: 2.8 g/dL (ref 1.5–4.5)
Potassium: 4.4 mmol/L (ref 3.5–5.2)
Sodium: 137 mmol/L (ref 134–144)
TOTAL PROTEIN: 6.8 g/dL (ref 6.0–8.5)

## 2016-10-14 LAB — VITAMIN D 25 HYDROXY (VIT D DEFICIENCY, FRACTURES): VIT D 25 HYDROXY: 10.9 ng/mL — AB (ref 30.0–100.0)

## 2016-10-14 LAB — CBC WITH DIFFERENTIAL/PLATELET
BASOS ABS: 0.1 10*3/uL (ref 0.0–0.2)
Basos: 1 %
EOS (ABSOLUTE): 0.2 10*3/uL (ref 0.0–0.4)
Eos: 3 %
Hematocrit: 45.4 % (ref 34.0–46.6)
Hemoglobin: 15.1 g/dL (ref 11.1–15.9)
IMMATURE GRANS (ABS): 0 10*3/uL (ref 0.0–0.1)
IMMATURE GRANULOCYTES: 0 %
LYMPHS: 33 %
Lymphocytes Absolute: 2.3 10*3/uL (ref 0.7–3.1)
MCH: 31.1 pg (ref 26.6–33.0)
MCHC: 33.3 g/dL (ref 31.5–35.7)
MCV: 94 fL (ref 79–97)
Monocytes Absolute: 0.4 10*3/uL (ref 0.1–0.9)
Monocytes: 6 %
NEUTROS PCT: 57 %
Neutrophils Absolute: 4 10*3/uL (ref 1.4–7.0)
PLATELETS: 319 10*3/uL (ref 150–379)
RBC: 4.85 x10E6/uL (ref 3.77–5.28)
RDW: 13.8 % (ref 12.3–15.4)
WBC: 7 10*3/uL (ref 3.4–10.8)

## 2016-10-14 LAB — T3: T3, Total: 111 ng/dL (ref 71–180)

## 2016-10-14 LAB — LIPID PANEL WITH LDL/HDL RATIO
Cholesterol, Total: 232 mg/dL — ABNORMAL HIGH (ref 100–199)
HDL: 46 mg/dL (ref >39)
LDL Calculated: 140 mg/dL — ABNORMAL HIGH (ref 0–99)
LDL/HDL RATIO: 3 ratio (ref 0.0–3.2)
Triglycerides: 231 mg/dL — ABNORMAL HIGH (ref 0–149)
VLDL CHOLESTEROL CAL: 46 mg/dL — AB (ref 5–40)

## 2016-10-14 LAB — T4, FREE: FREE T4: 1.08 ng/dL (ref 0.82–1.77)

## 2016-10-14 LAB — HEMOGLOBIN A1C
Est. average glucose Bld gHb Est-mCnc: 214 mg/dL
Hgb A1c MFr Bld: 9.1 % — ABNORMAL HIGH (ref 4.8–5.6)

## 2016-10-14 LAB — VITAMIN B12: VITAMIN B 12: 232 pg/mL (ref 232–1245)

## 2016-10-14 LAB — TSH: TSH: 6.92 u[IU]/mL — ABNORMAL HIGH (ref 0.450–4.500)

## 2016-10-14 LAB — FOLATE: Folate: 8.5 ng/mL (ref >3.0)

## 2016-10-14 LAB — INSULIN, RANDOM: INSULIN: 23.2 u[IU]/mL (ref 2.6–24.9)

## 2016-10-15 LAB — MICROALBUMIN / CREATININE URINE RATIO
CREATININE, UR: 184.3 mg/dL
MICROALB/CREAT RATIO: 6.1 mg/g{creat} (ref 0.0–30.0)
MICROALBUM., U, RANDOM: 11.2 ug/mL

## 2016-10-26 ENCOUNTER — Ambulatory Visit (INDEPENDENT_AMBULATORY_CARE_PROVIDER_SITE_OTHER): Payer: 59 | Admitting: Family Medicine

## 2016-10-26 VITALS — BP 139/82 | HR 86 | Temp 98.0°F | Ht 64.0 in | Wt 225.0 lb

## 2016-10-26 DIAGNOSIS — Z9189 Other specified personal risk factors, not elsewhere classified: Secondary | ICD-10-CM | POA: Diagnosis not present

## 2016-10-26 DIAGNOSIS — E782 Mixed hyperlipidemia: Secondary | ICD-10-CM | POA: Diagnosis not present

## 2016-10-26 DIAGNOSIS — E559 Vitamin D deficiency, unspecified: Secondary | ICD-10-CM | POA: Diagnosis not present

## 2016-10-26 DIAGNOSIS — E669 Obesity, unspecified: Secondary | ICD-10-CM | POA: Diagnosis not present

## 2016-10-26 DIAGNOSIS — E119 Type 2 diabetes mellitus without complications: Secondary | ICD-10-CM | POA: Diagnosis not present

## 2016-10-26 DIAGNOSIS — IMO0001 Reserved for inherently not codable concepts without codable children: Secondary | ICD-10-CM

## 2016-10-26 DIAGNOSIS — Z6838 Body mass index (BMI) 38.0-38.9, adult: Secondary | ICD-10-CM | POA: Diagnosis not present

## 2016-10-26 MED ORDER — VITAMIN D (ERGOCALCIFEROL) 1.25 MG (50000 UNIT) PO CAPS: 50000.0000 [IU] | ORAL_CAPSULE | ORAL | 0 refills | Status: DC

## 2016-10-26 MED ORDER — DULAGLUTIDE 1.5 MG/0.5ML ~~LOC~~ SOAJ: 1.5000 mg | SUBCUTANEOUS | 0 refills | Status: DC

## 2016-10-26 MED FILL — VIT D2 1.25 MG (50,000 UNIT: 1.25 MG | 28 days supply | Qty: 4 | Fill #0

## 2016-10-26 MED FILL — TRULICITY 1.5 MG/0.5 ML PEN: 1.5 | 28 days supply | Qty: 2 | Fill #0

## 2016-10-26 NOTE — Progress Notes (Signed)
Office: [phone]  /  Fax: [phone]   HPI:   Chief Complaint: OBESITY Shelley Garcia is here to discuss her progress with her obesity treatment plan. She is on the  follow the Category 2 plan and is following her eating plan approximately 100 % of the time. She states she is exercising 0 minutes 0 times per week. Shelley Garcia  Struggled to follow her plan due to hunger issues. She is frustrated that she didn't lose weight but actually did lose fat but is now better hydrated due to improved glucose levels. This was all explained to Shelley Garcia. Her weight is 225 lb (102.1 kg) today and has maintained weight over a period of 2 weeks since her last visit. She has lost 0 lbs since starting treatment with [place].  Vitamin D deficiency Shelley Garcia has a diagnosis of vitamin D deficiency. She is currently taking OTC vit D but her level is still very low. She admits fatigue and denies nausea, vomiting or muscle weakness.  At risk for osteopenia Shelley Garcia is at higher risk of osteopenia and osteoporosis due to vitamin D deficiency.   Diabetes II Shelley Garcia has a diagnosis of diabetes type II. Shelley Garcia states fasting BGs 170's up from 120's on ketogenic diet and 2 hour post prandial went to 200 to 250. She admits polyphagia and denies any hypoglycemic episodes. She never started Lantus due to not wanting to gain weight. She has been working on intensive lifestyle modifications including diet, exercise, and weight loss to help control her blood glucose levels.  Mixed Hyperlipidemia Shelley Garcia has mixed hyperlipidemia, LDL is elevated at 140 and Triglycerides are elevated at 231 and HDL is low at 46 on Livalo (Shelley Garcia may not be taking) She would like to work on intensive lifestyle modification including a low saturated fat diet, exercise and weight loss to improve her cholesterol levels. She denies any chest pain, claudication or myalgias.   ALLERGIES: Allergies  Allergen Reactions  . Metformin And Related     Made pt really sick  .  Codeine Rash    MEDICATIONS: Current Outpatient Prescriptions on File Prior to Visit  Medication Sig Dispense Refill  . aspirin EC 81 MG tablet Take 81 mg by mouth daily.    . Blood Glucose Monitoring Suppl (FREESTYLE LITE) DEVI Use as instructed 2 to 3 times daily to check blood sugar.  Dx E11.8 1 each 0  . cyanocobalamin (,VITAMIN B-12,) 1000 MCG/ML injection Inject 1 mL (1,000 mcg total) into the muscle every 30 (thirty) days. 10 mL 0  . empagliflozin (JARDIANCE) 25 MG TABS tablet Take 25 mg by mouth daily. 30 tablet 5  . glucose blood (FREESTYLE LITE) test strip Use as instructed to check blood sugar 2-3 times a day.  Dx  E11.8 100 each 5  . Lancets (FREESTYLE) lancets Use to check blood sugar 2-3 times daily.  Dx E11.8 100 each 5  . Multiple Vitamin (MULTIVITAMIN) tablet Take 1 tablet by mouth daily. Reported on 07/22/2015    . pantoprazole (PROTONIX) 40 MG tablet TAKE 1 TABLET BY MOUTH ONCE DAILY (MAKE APPT FOR FURTHER REFILLS) 30 tablet 5  . Pitavastatin Calcium (LIVALO) 4 MG TABS Take 1 tablet (4 mg total) by mouth daily. 30 tablet 5  . ramipril (ALTACE) 2.5 MG capsule Take 1 capsule (2.5 mg total) by mouth daily. 30 capsule 0  . zolpidem (AMBIEN) 10 MG tablet Take 1/2 by mouth at night 15 tablet 2   No current facility-administered medications on file prior to visit.  PAST MEDICAL HISTORY: Past Medical History:  Diagnosis Date  . Back pain   . Diabetes mellitus   . Gallbladder problem   . GERD (gastroesophageal reflux disease)   . History of chicken pox   . Hyperlipidemia   . Hypertension   . Joint pain   . Obesity   . Vitamin B12 deficiency   . Vitamin D deficiency     PAST SURGICAL HISTORY: Past Surgical History:  Procedure Laterality Date  . CHOLECYSTECTOMY    . EXPLORATORY LAPAROTOMY    . KIDNEY STONE SURGERY    . WISDOM TOOTH EXTRACTION  07/31/2013    SOCIAL HISTORY: Social History  Substance Use Topics  . Smoking status: Former Smoker    Packs/day:  1.00    Types: Cigarettes    Start date: 06/11/2014  . Smokeless tobacco: Never Used     Comment: pt doing the vapor cigerette   . Alcohol use 0.0 oz/week     Comment: rarely    FAMILY HISTORY: Family History  Problem Relation Age of Onset  . Hypertension Father   . Diabetes Father   . Lung cancer Father   . Kidney failure Father        Living  . Hypertension Mother 49       Deceased  . Diabetes Mother   . Mental illness Mother        Schizophrenia  . Stroke Mother   . Bipolar disorder Mother   . Depression Mother   . Anxiety disorder Mother   . Obesity Mother   . Diabetes Sister        Deceased  . Stroke Sister   . Mental illness Brother        Deceased  . Colon cancer Paternal Grandfather   . Diabetes Other        Maternal/Paternal side  . Hypertension Other        Maternal/Paternal side  . Multiple sclerosis Maternal Aunt   . Hypertension Sister   . Healthy Son        x2    ROS: Review of Systems  Constitutional: Positive for malaise/fatigue. Negative for weight loss.  Cardiovascular: Negative for chest pain and claudication.  Gastrointestinal: Negative for nausea and vomiting.  Musculoskeletal: Negative for myalgias.       Negative muscle weakness  Endo/Heme/Allergies:       Polyphagia Negative hypoglycemia    PHYSICAL EXAM: Blood pressure 139/82, pulse 86, temperature 98 F (36.7 C), temperature source Oral, height 5\' 4"  (1.626 m), weight 225 lb (102.1 kg), SpO2 99 %. Body mass index is 38.62 kg/m. Physical Exam  Constitutional: She is oriented to person, place, and time. She appears well-developed and well-nourished.  Cardiovascular: Normal rate.   Pulmonary/Chest: Effort normal.  Musculoskeletal: Normal range of motion.  Neurological: She is oriented to person, place, and time.  Skin: Skin is warm and dry.  Psychiatric: She has a normal mood and affect. Her behavior is normal.  Vitals reviewed.   RECENT LABS AND TESTS: BMET    Component  Value Date/Time   NA 137 10/12/2016 0959   K 4.4 10/12/2016 0959   CL 101 10/12/2016 0959   CO2 18 (L) 10/12/2016 0959   GLUCOSE 234 (H) 10/12/2016 0959   GLUCOSE 271 (H) 05/21/2016 1532   BUN 11 10/12/2016 0959   CREATININE 0.62 10/12/2016 0959   CREATININE 0.64 05/21/2016 1532   CALCIUM 9.4 10/12/2016 0959   GFRNONAA 106 10/12/2016 0959   GFRAA 122 10/12/2016  8588   Lab Results  Component Value Date   HGBA1C 9.1 (H) 10/12/2016   HGBA1C 7.4 07/27/2016   HGBA1C 9.3 (H) 05/21/2016   HGBA1C 8.6 07/22/2015   HGBA1C 9.8 (H) 05/19/2015   Lab Results  Component Value Date   INSULIN 23.2 10/12/2016   CBC    Component Value Date/Time   WBC 7.0 10/12/2016 0959   WBC 12.6 (H) 05/27/2012 2345   RBC 4.85 10/12/2016 0959   RBC 4.79 05/27/2012 2345   HGB 15.1 10/12/2016 0959   HCT 45.4 10/12/2016 0959   PLT 319 10/12/2016 0959   MCV 94 10/12/2016 0959   MCH 31.1 10/12/2016 0959   MCH 31.7 05/27/2012 2345   MCHC 33.3 10/12/2016 0959   MCHC 34.9 05/27/2012 2345   RDW 13.8 10/12/2016 0959   LYMPHSABS 2.3 10/12/2016 0959   MONOABS 0.8 05/27/2012 2345   EOSABS 0.2 10/12/2016 0959   BASOSABS 0.1 10/12/2016 0959   Iron/TIBC/Ferritin/ %Sat No results found for: IRON, TIBC, FERRITIN, IRONPCTSAT Lipid Panel     Component Value Date/Time   CHOL 232 (H) 10/12/2016 0959   TRIG 231 (H) 10/12/2016 0959   HDL 46 10/12/2016 0959   CHOLHDL 5.4 (H) 05/21/2016 1532   VLDL 43 (H) 05/21/2016 1532   LDLCALC 140 (H) 10/12/2016 0959   LDLDIRECT 201.4 02/06/2014 1139   Hepatic Function Panel     Component Value Date/Time   PROT 6.8 10/12/2016 0959   ALBUMIN 4.0 10/12/2016 0959   AST 17 10/12/2016 0959   ALT 21 10/12/2016 0959   ALKPHOS 56 10/12/2016 0959   BILITOT 0.3 10/12/2016 0959      Component Value Date/Time   TSH 6.920 (H) 10/12/2016 0959   TSH 3.734 05/17/2012 0917    ASSESSMENT AND PLAN: Type 2 diabetes mellitus without complication, without long-term current use of  insulin (HCC) - Plan: Dulaglutide (TRULICITY) 1.5 FO/2.7XA SOPN  Vitamin D deficiency - Plan: Vitamin D, Ergocalciferol, (DRISDOL) 50000 units CAPS capsule  Mixed hyperlipidemia  At risk for osteoporosis  Class 2 obesity with serious comorbidity and body mass index (BMI) of 38.0 to 38.9 in adult, unspecified obesity type  PLAN:  Vitamin D Deficiency Angeles was informed that low vitamin D levels contributes to fatigue and are associated with obesity, breast, and colon cancer. She agrees to start to take prescription Vit D @50 ,000 IU every week #4 with no refills and will follow up for routine testing of vitamin D, at least 2-3 times per year. She was informed of the risk of over-replacement of vitamin D and agrees to not increase her dose unless he discusses this with [place] first. Shelley Garcia agrees to follow up with our clinic in 2 weeks.  At risk for osteopenia Shelley Garcia is at risk for osteopenia and osteoporsis due to her vitamin D deficiency. She was encouraged to take her vitamin D and follow her higher calcium diet and increase strengthening exercise to help strengthen her bones and decrease her risk of osteopenia and osteoporosis.  Diabetes II Shelley Garcia has been given extensive diabetes education by myself today including ideal fasting and post-prandial blood glucose readings, individual ideal Hgb A1c goals  and hypoglycemia prevention. We discussed the importance of good blood sugar control to decrease the likelihood of diabetic complications such as nephropathy, neuropathy, limb loss, blindness, coronary artery disease, and death. We discussed the importance of intensive lifestyle modification including diet, exercise and weight loss as the first line treatment for diabetes. Shelley Garcia agrees to increase Trulicity to 1.5  mg every week #4 with no refills and continue to take Jardiance as prescribed and she will follow up at the agreed upon time.  Mixed Hyperlipidemia Shelley Garcia was informed of the American Heart  Association Guidelines emphasizing intensive lifestyle modifications as the first line treatment for hyperlipidemia. We discussed many lifestyle modifications today in depth, and Shelley Garcia will continue to work on decreasing saturated fats such as fatty red meat, butter and many fried foods. She will also increase vegetables and lean protein in her diet and continue to work on exercise and weight loss efforts. We will check labs in 3 months and Shelley Garcia agrees to follow up with our clinic in 2 weeks.  Obesity Shelley Garcia is currently in the action stage of change. As such, her goal is to continue with weight loss efforts She has agreed to change to follow a lower carbohydrate, vegetable and lean protein rich diet plan Shelley Garcia has been instructed to work up to a goal of 150 minutes of combined cardio and strengthening exercise per week for weight loss and overall health benefits. We discussed the following Behavioral Modification Strategies today: meal planning & cooking strategies, planning for success, increasing lean protein intake, decreasing simple carbohydrates  and avoiding temptations  Shelley Garcia has agreed to follow up with our clinic in 2 weeks. She was informed of the importance of frequent follow up visits to maximize her success with intensive lifestyle modifications for her multiple health conditions.  I, Doreene Nest, am acting as transcriptionist for Shelley Nip, MD  I have reviewed the above documentation for accuracy and completeness, and I agree with the above. -Shelley Nip, MD  OBESITY BEHAVIORAL INTERVENTION VISIT  Today's visit was # 2 out of 1.  Starting weight: 225 lbs Starting date: 10/12/16 Today's weight : 225 lbs Today's date: 10/26/2016 Total lbs lost to date: 0 (Patients must lose 7 lbs in the first 6 months to continue with counseling)   ASK: We discussed the diagnosis of obesity with Starling Manns today and Karolina agreed to give [place] permission to discuss obesity behavioral  modification therapy today.  ASSESS: Mariangel has the diagnosis of obesity and her BMI today is 51.7 [employer] is in the action stage of change   ADVISE: Ruthe was educated on the multiple health risks of obesity as well as the benefit of weight loss to improve her health. She was advised of the need for long term treatment and the importance of lifestyle modifications.  AGREE: Multiple dietary modification options and treatment options were discussed and  Celester agreed to change to follow a lower carbohydrate, vegetable and lean protein rich diet plan We discussed the following Behavioral Modification Strategies today: meal planning & cooking strategies, planning for success, increasing lean protein intake, decreasing simple carbohydrates  and avoiding temptations

## 2016-11-02 ENCOUNTER — Ambulatory Visit: Payer: 59 | Admitting: Internal Medicine

## 2016-11-09 ENCOUNTER — Ambulatory Visit (INDEPENDENT_AMBULATORY_CARE_PROVIDER_SITE_OTHER): Payer: 59 | Admitting: Family Medicine

## 2016-11-09 VITALS — BP 137/84 | HR 80 | Temp 98.3°F | Ht 64.0 in | Wt 221.0 lb

## 2016-11-09 DIAGNOSIS — Z6837 Body mass index (BMI) 37.0-37.9, adult: Secondary | ICD-10-CM

## 2016-11-09 DIAGNOSIS — E669 Obesity, unspecified: Secondary | ICD-10-CM | POA: Insufficient documentation

## 2016-11-09 DIAGNOSIS — E66812 Obesity, class 2: Secondary | ICD-10-CM

## 2016-11-09 DIAGNOSIS — E119 Type 2 diabetes mellitus without complications: Secondary | ICD-10-CM

## 2016-11-10 NOTE — Progress Notes (Signed)
Office: [phone]  /  Fax: [phone]   HPI:   Chief Complaint: OBESITY Shelley Garcia is here to discuss her progress with her obesity treatment plan. She is on the  follow a lower carbohydrate, vegetable and lean protein rich diet plan and is following her eating plan approximately 90 % of the time. She states she is exercising 0 minutes 0 times per week. Shelley Garcia did well  on the lower carb plan and states hunger was well controlled. Previously, she was on the category 2 plan but did not eat all the food and felt her hunger was not as well controlled. She would like to know about different  snack options.  Her weight is 221 lb (100.2 kg) today and has had a weight loss of 4 pounds over a period of 2 weeks since her last visit. She has lost 4 lbs since starting treatment with [place].  Diabetes II Shelley Garcia has a diagnosis of diabetes type II. Shelley Garcia states her FBGs have been in the 140s, and post prandial in the 170s and denies any hypoglycemic episodes. Last A1c was 9.1 on 10/12/16.   She has been working on intensive lifestyle modifications including diet, exercise, and weight loss to help control her blood glucose levels.    ALLERGIES: Allergies  Allergen Reactions  . Metformin And Related     Made pt really sick  . Codeine Rash    MEDICATIONS: Current Outpatient Prescriptions on File Prior to Visit  Medication Sig Dispense Refill  . aspirin EC 81 MG tablet Take 81 mg by mouth daily.    . Blood Glucose Monitoring Suppl (FREESTYLE LITE) DEVI Use as instructed 2 to 3 times daily to check blood sugar.  Dx E11.8 1 each 0  . cyanocobalamin (,VITAMIN B-12,) 1000 MCG/ML injection Inject 1 mL (1,000 mcg total) into the muscle every 30 (thirty) days. 10 mL 0  . Dulaglutide (TRULICITY) 1.5 ON/6.2XB SOPN Inject 1.5 mg into the skin once a week. 4 pen 0  . empagliflozin (JARDIANCE) 25 MG TABS tablet Take 25 mg by mouth daily. 30 tablet 5  . glucose blood (FREESTYLE LITE) test strip Use as instructed to  check blood sugar 2-3 times a day.  Dx  E11.8 100 each 5  . Lancets (FREESTYLE) lancets Use to check blood sugar 2-3 times daily.  Dx E11.8 100 each 5  . Multiple Vitamin (MULTIVITAMIN) tablet Take 1 tablet by mouth daily. Reported on 07/22/2015    . pantoprazole (PROTONIX) 40 MG tablet TAKE 1 TABLET BY MOUTH ONCE DAILY (MAKE APPT FOR FURTHER REFILLS) 30 tablet 5  . Pitavastatin Calcium (LIVALO) 4 MG TABS Take 1 tablet (4 mg total) by mouth daily. 30 tablet 5  . ramipril (ALTACE) 2.5 MG capsule Take 1 capsule (2.5 mg total) by mouth daily. 30 capsule 0  . Vitamin D, Ergocalciferol, (DRISDOL) 50000 units CAPS capsule Take 1 capsule (50,000 Units total) by mouth every 7 (seven) days. 4 capsule 0  . zolpidem (AMBIEN) 10 MG tablet Take 1/2 by mouth at night 15 tablet 2   No current facility-administered medications on file prior to visit.     PAST MEDICAL HISTORY: Past Medical History:  Diagnosis Date  . Back pain   . Diabetes mellitus   . Gallbladder problem   . GERD (gastroesophageal reflux disease)   . History of chicken pox   . Hyperlipidemia   . Hypertension   . Joint pain   . Obesity   . Vitamin B12 deficiency   .  Vitamin D deficiency     PAST SURGICAL HISTORY: Past Surgical History:  Procedure Laterality Date  . CHOLECYSTECTOMY    . EXPLORATORY LAPAROTOMY    . KIDNEY STONE SURGERY    . WISDOM TOOTH EXTRACTION  07/31/2013    SOCIAL HISTORY: Social History  Substance Use Topics  . Smoking status: Former Smoker    Packs/day: 1.00    Types: Cigarettes    Start date: 06/11/2014  . Smokeless tobacco: Never Used     Comment: pt doing the vapor cigerette   . Alcohol use 0.0 oz/week     Comment: rarely    FAMILY HISTORY: Family History  Problem Relation Age of Onset  . Hypertension Father   . Diabetes Father   . Lung cancer Father   . Kidney failure Father        Living  . Hypertension Mother 49       Deceased  . Diabetes Mother   . Mental illness Mother         Schizophrenia  . Stroke Mother   . Bipolar disorder Mother   . Depression Mother   . Anxiety disorder Mother   . Obesity Mother   . Diabetes Sister        Deceased  . Stroke Sister   . Mental illness Brother        Deceased  . Colon cancer Paternal Grandfather   . Diabetes Other        Maternal/Paternal side  . Hypertension Other        Maternal/Paternal side  . Multiple sclerosis Maternal Aunt   . Hypertension Sister   . Healthy Son        x2    ROS: Review of Systems  Constitutional: Positive for weight loss.  Endo/Heme/Allergies:       Negative polyphagia or hypoglycemia    PHYSICAL EXAM: Blood pressure 137/84, pulse 80, temperature 98.3 F (36.8 C), temperature source Oral, height 5\' 4"  (1.626 m), weight 221 lb (100.2 kg), SpO2 98 %. Body mass index is 37.93 kg/m. Physical Exam  Constitutional: She is oriented to person, place, and time. She appears well-developed and well-nourished.  Cardiovascular: Normal rate.   Pulmonary/Chest: Effort normal.  Musculoskeletal: Normal range of motion.  Neurological: She is alert and oriented to person, place, and time.  Skin: Skin is warm and dry.  Psychiatric: She has a normal mood and affect.    RECENT LABS AND TESTS: BMET    Component Value Date/Time   NA 137 10/12/2016 0959   K 4.4 10/12/2016 0959   CL 101 10/12/2016 0959   CO2 18 (L) 10/12/2016 0959   GLUCOSE 234 (H) 10/12/2016 0959   GLUCOSE 271 (H) 05/21/2016 1532   BUN 11 10/12/2016 0959   CREATININE 0.62 10/12/2016 0959   CREATININE 0.64 05/21/2016 1532   CALCIUM 9.4 10/12/2016 0959   GFRNONAA 106 10/12/2016 0959   GFRAA 122 10/12/2016 0959   Lab Results  Component Value Date   HGBA1C 9.1 (H) 10/12/2016   HGBA1C 7.4 07/27/2016   HGBA1C 9.3 (H) 05/21/2016   HGBA1C 8.6 07/22/2015   HGBA1C 9.8 (H) 05/19/2015   Lab Results  Component Value Date   INSULIN 23.2 10/12/2016   CBC    Component Value Date/Time   WBC 7.0 10/12/2016 0959   WBC 12.6 (H)  05/27/2012 2345   RBC 4.85 10/12/2016 0959   RBC 4.79 05/27/2012 2345   HGB 15.1 10/12/2016 0959   HCT 45.4 10/12/2016 0959  PLT 319 10/12/2016 0959   MCV 94 10/12/2016 0959   MCH 31.1 10/12/2016 0959   MCH 31.7 05/27/2012 2345   MCHC 33.3 10/12/2016 0959   MCHC 34.9 05/27/2012 2345   RDW 13.8 10/12/2016 0959   LYMPHSABS 2.3 10/12/2016 0959   MONOABS 0.8 05/27/2012 2345   EOSABS 0.2 10/12/2016 0959   BASOSABS 0.1 10/12/2016 0959   Iron/TIBC/Ferritin/ %Sat No results found for: IRON, TIBC, FERRITIN, IRONPCTSAT Lipid Panel     Component Value Date/Time   CHOL 232 (H) 10/12/2016 0959   TRIG 231 (H) 10/12/2016 0959   HDL 46 10/12/2016 0959   CHOLHDL 5.4 (H) 05/21/2016 1532   VLDL 43 (H) 05/21/2016 1532   LDLCALC 140 (H) 10/12/2016 0959   LDLDIRECT 201.4 02/06/2014 1139   Hepatic Function Panel     Component Value Date/Time   PROT 6.8 10/12/2016 0959   ALBUMIN 4.0 10/12/2016 0959   AST 17 10/12/2016 0959   ALT 21 10/12/2016 0959   ALKPHOS 56 10/12/2016 0959   BILITOT 0.3 10/12/2016 0959      Component Value Date/Time   TSH 6.920 (H) 10/12/2016 0959   TSH 3.734 05/17/2012 0917    ASSESSMENT AND PLAN: Type 2 diabetes mellitus without complication, without long-term current use of insulin (HCC)  Class 2 obesity without serious comorbidity with body mass index (BMI) of 37.0 to 37.9 in adult, unspecified obesity type  PLAN:  Diabetes II Pacey has been given extensive diabetes education by myself today including ideal fasting and post-prandial blood glucose readings, individual ideal HgA1c goals  and hypoglycemia prevention. We discussed the importance of good blood sugar control to decrease the likelihood of diabetic complications such as nephropathy, neuropathy, limb loss, blindness, coronary artery disease, and death. We discussed the importance of intensive lifestyle modification including diet, exercise and weight loss as the first line treatment for diabetes. Shelley Garcia  agrees to continue her diabetes medications and will follow up at the agreed upon time.  Obesity Shelley Garcia is currently in the action stage of change. As such, her goal is to continue with weight loss efforts She has agreed to follow the Category 2 plan Shelley Garcia has been instructed to work up to a goal of 150 minutes of combined cardio and strengthening exercise per week for weight loss and overall health benefits. We discussed the following Behavioral Modification Stratagies today: increasing lean protein intake, better snack options, and avoid skipping meals.   We spent > than 50% of the 15 minute visit on the counseling as documented in the note.   Shelley Garcia has agreed to follow up with our clinic in 2 weeks. She was informed of the importance of frequent follow up visits to maximize her success with intensive lifestyle modifications for her multiple health conditions.   Office: [phone]  /  Fax: [phone]  OBESITY BEHAVIORAL INTERVENTION VISIT  Today's visit was # 3 out of 22.  Starting weight: 225 Starting date: 10/12/16 Today's weight : Weight: 221 lb (100.2 kg)  Today's date: 11/10/2016 Total lbs lost to date: 4 (Patients must lose 7 lbs in the first 6 months to continue with counseling)   ASK: We discussed the diagnosis of obesity with Shelley Garcia today and Shelley Garcia agreed to give [place] permission to discuss obesity behavioral modification therapy today.  ASSESS: Shelley Garcia has the diagnosis of obesity and her BMI today is 80 Shelley Garcia is in the action stage of change   ADVISE: Shelley Garcia was educated on the multiple health risks of obesity as  well as the benefit of weight loss to improve her health. She was advised of the need for long term treatment and the importance of lifestyle modifications.  AGREE: Multiple dietary modification options and treatment options were discussed and  Shelley Garcia agreed to follow the Category 2 plan We discussed the following Behavioral Modification Stratagies  today: increasing lean protein intake, better snacking options, and avoid skipping meals.      I have reviewed the above documentation for accuracy and completeness, and I agree with the above. -Lacy Duverney, PA-C  I have reviewed the above note and agree with the plan. -Dennard Nip, MD

## 2016-11-23 ENCOUNTER — Other Ambulatory Visit (INDEPENDENT_AMBULATORY_CARE_PROVIDER_SITE_OTHER): Payer: Self-pay | Admitting: Family Medicine

## 2016-11-23 ENCOUNTER — Ambulatory Visit (INDEPENDENT_AMBULATORY_CARE_PROVIDER_SITE_OTHER): Payer: 59 | Admitting: Physician Assistant

## 2016-11-23 VITALS — BP 142/79 | HR 87 | Temp 98.6°F | Ht 64.0 in | Wt 220.0 lb

## 2016-11-23 DIAGNOSIS — Z9189 Other specified personal risk factors, not elsewhere classified: Secondary | ICD-10-CM | POA: Diagnosis not present

## 2016-11-23 DIAGNOSIS — E669 Obesity, unspecified: Secondary | ICD-10-CM | POA: Diagnosis not present

## 2016-11-23 DIAGNOSIS — E119 Type 2 diabetes mellitus without complications: Secondary | ICD-10-CM

## 2016-11-23 DIAGNOSIS — I10 Essential (primary) hypertension: Secondary | ICD-10-CM | POA: Diagnosis not present

## 2016-11-23 DIAGNOSIS — Z6837 Body mass index (BMI) 37.0-37.9, adult: Secondary | ICD-10-CM

## 2016-11-23 DIAGNOSIS — E559 Vitamin D deficiency, unspecified: Secondary | ICD-10-CM | POA: Diagnosis not present

## 2016-11-23 DIAGNOSIS — E6609 Other obesity due to excess calories: Secondary | ICD-10-CM | POA: Insufficient documentation

## 2016-11-23 DIAGNOSIS — IMO0001 Reserved for inherently not codable concepts without codable children: Secondary | ICD-10-CM

## 2016-11-23 HISTORY — DX: Essential (primary) hypertension: I10

## 2016-11-23 MED ORDER — DULAGLUTIDE 1.5 MG/0.5ML ~~LOC~~ SOAJ: 1.5000 mg | SUBCUTANEOUS | 0 refills | Status: DC

## 2016-11-23 MED ORDER — LOSARTAN POTASSIUM 50 MG PO TABS: 50.0000 mg | ORAL_TABLET | Freq: Every day | ORAL | 0 refills | Status: DC

## 2016-11-23 MED FILL — TRULICITY 1.5 MG/0.5 ML PEN: 1.5 | 28 days supply | Qty: 2 | Fill #0

## 2016-11-23 MED FILL — PANTOPRAZOLE SOD DR 40 MG T: 40 | 30 days supply | Qty: 30 | Fill #1

## 2016-11-23 MED FILL — LIVALO 4 MG TABLET: 4 | 30 days supply | Qty: 30 | Fill #1

## 2016-11-23 MED FILL — TOUJEO SOLOSTAR 300 UNITS/M: 300 | 21 days supply | Qty: 5 | Fill #1

## 2016-11-23 MED FILL — LOSARTAN POTASSIUM 50 MG TA: 50 | 30 days supply | Qty: 30 | Fill #0

## 2016-11-23 NOTE — Progress Notes (Signed)
Office: [phone]  /  Fax: [phone]   HPI:   Chief Complaint: OBESITY Shelley Garcia is here to discuss her progress with her obesity treatment plan. She is on the Category 2 plan and is following her eating plan approximately 100 % of the time. She states she is exercising 0 minutes 0 times per week. [name] continues to do well with weight loss. She has not been eating all the protein and states hunger is not well controlled. [name] states low carb diet satisfies her hunger better. Her weight is 220 lb (99.8 kg) today and has had a weight loss of 1 pound over a period of 2 weeks since her last visit. She has lost 5 lbs since starting treatment with [place].  Vitamin D deficiency Shelley Garcia has a diagnosis of vitamin D deficiency. She is currently taking vit D and denies nausea, vomiting or muscle weakness.  At risk for osteopenia and osteoporosis Shelley Garcia is at higher risk of osteopenia and osteoporosis due to vitamin D deficiency.   Essential Hypertension BENTLI Shelley Garcia is a 51 y.o. female with hypertension and her blood pressure is elevated with systolic > 956. She is on ace inhibitor (Altace) but is not taking it because it makes her feel dizzy. Shelley Garcia denies chest pain or shortness of breath on exertion. She is working weight loss to help control her blood pressure with the goal of decreasing her risk of heart attack and stroke. [name] blood pressure is not currently controlled.  Diabetes II Shelley Garcia has a diagnosis of diabetes type II. Shelley Garcia states she does not check BGs at home and denies any hypoglycemic episodes. Shelley Garcia has polyphagia on the category 2 plan, however she is not eating all her protein on the plan. She has been working on intensive lifestyle modifications including diet, exercise, and weight loss to help control her blood glucose levels.    ALLERGIES: Allergies  Allergen Reactions  . Metformin And Related     Made pt really sick  . Codeine Rash     MEDICATIONS: Current Outpatient Prescriptions on File Prior to Visit  Medication Sig Dispense Refill  . aspirin EC 81 MG tablet Take 81 mg by mouth daily.    . Blood Glucose Monitoring Suppl (FREESTYLE LITE) DEVI Use as instructed 2 to 3 times daily to check blood sugar.  Dx E11.8 1 each 0  . cyanocobalamin (,VITAMIN B-12,) 1000 MCG/ML injection Inject 1 mL (1,000 mcg total) into the muscle every 30 (thirty) days. 10 mL 0  . Dulaglutide (TRULICITY) 1.5 LO/7.5IE SOPN Inject 1.5 mg into the skin once a week. 4 pen 0  . empagliflozin (JARDIANCE) 25 MG TABS tablet Take 25 mg by mouth daily. 30 tablet 5  . glucose blood (FREESTYLE LITE) test strip Use as instructed to check blood sugar 2-3 times a day.  Dx  E11.8 100 each 5  . Lancets (FREESTYLE) lancets Use to check blood sugar 2-3 times daily.  Dx E11.8 100 each 5  . Multiple Vitamin (MULTIVITAMIN) tablet Take 1 tablet by mouth daily. Reported on 07/22/2015    . pantoprazole (PROTONIX) 40 MG tablet TAKE 1 TABLET BY MOUTH ONCE DAILY (MAKE APPT FOR FURTHER REFILLS) 30 tablet 5  . Pitavastatin Calcium (LIVALO) 4 MG TABS Take 1 tablet (4 mg total) by mouth daily. 30 tablet 5  . Vitamin D, Ergocalciferol, (DRISDOL) 50000 units CAPS capsule Take 1 capsule (50,000 Units total) by mouth every 7 (seven) days. 4 capsule 0  . zolpidem (AMBIEN) 10  MG tablet Take 1/2 by mouth at night 15 tablet 2   No current facility-administered medications on file prior to visit.     PAST MEDICAL HISTORY: Past Medical History:  Diagnosis Date  . Back pain   . Diabetes mellitus   . Gallbladder problem   . GERD (gastroesophageal reflux disease)   . History of chicken pox   . Hyperlipidemia   . Hypertension   . Joint pain   . Obesity   . Vitamin B12 deficiency   . Vitamin D deficiency     PAST SURGICAL HISTORY: Past Surgical History:  Procedure Laterality Date  . CHOLECYSTECTOMY    . EXPLORATORY LAPAROTOMY    . KIDNEY STONE SURGERY    . WISDOM TOOTH  EXTRACTION  07/31/2013    SOCIAL HISTORY: Social History  Substance Use Topics  . Smoking status: Former Smoker    Packs/day: 1.00    Types: Cigarettes    Start date: 06/11/2014  . Smokeless tobacco: Never Used     Comment: pt doing the vapor cigerette   . Alcohol use 0.0 oz/week     Comment: rarely    FAMILY HISTORY: Family History  Problem Relation Age of Onset  . Hypertension Father   . Diabetes Father   . Lung cancer Father   . Kidney failure Father        Living  . Hypertension Mother 72       Deceased  . Diabetes Mother   . Mental illness Mother        Schizophrenia  . Stroke Mother   . Bipolar disorder Mother   . Depression Mother   . Anxiety disorder Mother   . Obesity Mother   . Diabetes Sister        Deceased  . Stroke Sister   . Mental illness Brother        Deceased  . Colon cancer Paternal Grandfather   . Diabetes Other        Maternal/Paternal side  . Hypertension Other        Maternal/Paternal side  . Multiple sclerosis Maternal Aunt   . Hypertension Sister   . Healthy Son        x2    ROS: Review of Systems  Constitutional: Positive for weight loss.  Respiratory: Negative for shortness of breath (on exertion).   Cardiovascular: Negative for chest pain.  Gastrointestinal: Negative for nausea and vomiting.  Musculoskeletal:       Negative muscle weakness  Endo/Heme/Allergies:       Polyphagia Negative hypoglycemia    PHYSICAL EXAM: Blood pressure (!) 142/79, pulse 87, temperature 98.6 F (37 C), temperature source Oral, height 5\' 4"  (1.626 m), weight 220 lb (99.8 kg), last menstrual period 11/10/2016, SpO2 96 %. Body mass index is 37.76 kg/m. Physical Exam  Constitutional: She is oriented to person, place, and time. She appears well-developed and well-nourished.  Cardiovascular: Normal rate.   Pulmonary/Chest: Effort normal.  Musculoskeletal: Normal range of motion.  Neurological: She is oriented to person, place, and time.  Skin:  Skin is warm and dry.  Psychiatric: She has a normal mood and affect. Her behavior is normal.  Vitals reviewed.   RECENT LABS AND TESTS: BMET    Component Value Date/Time   NA 137 10/12/2016 0959   K 4.4 10/12/2016 0959   CL 101 10/12/2016 0959   CO2 18 (L) 10/12/2016 0959   GLUCOSE 234 (H) 10/12/2016 0959   GLUCOSE 271 (H) 05/21/2016 1532  BUN 11 10/12/2016 0959   CREATININE 0.62 10/12/2016 0959   CREATININE 0.64 05/21/2016 1532   CALCIUM 9.4 10/12/2016 0959   GFRNONAA 106 10/12/2016 0959   GFRAA 122 10/12/2016 0959   Lab Results  Component Value Date   HGBA1C 9.1 (H) 10/12/2016   HGBA1C 7.4 07/27/2016   HGBA1C 9.3 (H) 05/21/2016   HGBA1C 8.6 07/22/2015   HGBA1C 9.8 (H) 05/19/2015   Lab Results  Component Value Date   INSULIN 23.2 10/12/2016   CBC    Component Value Date/Time   WBC 7.0 10/12/2016 0959   WBC 12.6 (H) 05/27/2012 2345   RBC 4.85 10/12/2016 0959   RBC 4.79 05/27/2012 2345   HGB 15.1 10/12/2016 0959   HCT 45.4 10/12/2016 0959   PLT 319 10/12/2016 0959   MCV 94 10/12/2016 0959   MCH 31.1 10/12/2016 0959   MCH 31.7 05/27/2012 2345   MCHC 33.3 10/12/2016 0959   MCHC 34.9 05/27/2012 2345   RDW 13.8 10/12/2016 0959   LYMPHSABS 2.3 10/12/2016 0959   MONOABS 0.8 05/27/2012 2345   EOSABS 0.2 10/12/2016 0959   BASOSABS 0.1 10/12/2016 0959   Iron/TIBC/Ferritin/ %Sat No results found for: IRON, TIBC, FERRITIN, IRONPCTSAT Lipid Panel     Component Value Date/Time   CHOL 232 (H) 10/12/2016 0959   TRIG 231 (H) 10/12/2016 0959   HDL 46 10/12/2016 0959   CHOLHDL 5.4 (H) 05/21/2016 1532   VLDL 43 (H) 05/21/2016 1532   LDLCALC 140 (H) 10/12/2016 0959   LDLDIRECT 201.4 02/06/2014 1139   Hepatic Function Panel     Component Value Date/Time   PROT 6.8 10/12/2016 0959   ALBUMIN 4.0 10/12/2016 0959   AST 17 10/12/2016 0959   ALT 21 10/12/2016 0959   ALKPHOS 56 10/12/2016 0959   BILITOT 0.3 10/12/2016 0959      Component Value Date/Time   TSH  6.920 (H) 10/12/2016 0959   TSH 3.734 05/17/2012 0917    ASSESSMENT AND PLAN: Type 2 diabetes mellitus without complication, without long-term current use of insulin ([place]) - Plan: Dulaglutide (TRULICITY) 1.5 SW/9.6PR SOPN  Vitamin D deficiency  Essential hypertension - Plan: losartan (COZAAR) 50 MG tablet  At risk for osteopenia  Class 2 obesity with serious comorbidity and body mass index (BMI) of 37.0 to 37.9 in adult, unspecified obesity type  PLAN:  Diabetes II Ailene has been given extensive diabetes education by myself today including ideal fasting and post-prandial blood glucose readings, individual ideal Hgb A1c goals  and hypoglycemia prevention. We discussed the importance of good blood sugar control to decrease the likelihood of diabetic complications such as nephropathy, neuropathy, limb loss, blindness, coronary artery disease, and death. We discussed the importance of intensive lifestyle modification including diet, exercise and weight loss as the first line treatment for diabetes. Morrigan agrees to continue Trulicity 1.5 mg/ once weekly, we will refill for 1.5 mg/0.5 ml 4 pens with no refills and she will follow up at the agreed upon time.  Essential Hypertension We discussed sodium restriction, working on healthy weight loss, and a regular exercise program as the means to achieve improved blood pressure control. Neeti agreed with this plan and agreed to follow up as directed. We will continue to monitor her blood pressure as well as her progress with the above lifestyle modifications. She  Agrees to start Losartan at 50 mg once daily #30 with no refills and stop Altace. She will watch for signs of hypotension as she continues her lifestyle modifications.  Vitamin  D Deficiency Garima was informed that low vitamin D levels contributes to fatigue and are associated with obesity, breast, and colon cancer. She agrees to continue to take prescription Vit D @50 ,000 IU every week and will  follow up for routine testing of vitamin D, at least 2-3 times per year. She was informed of the risk of over-replacement of vitamin D and agrees to not increase her dose unless he discusses this with [place] first.  At risk for osteopenia and osteoporosis Evalette is at risk for osteopenia and osteoporosis due to her vitamin D deficiency. She was encouraged to take her vitamin D and follow her higher calcium diet and increase strengthening exercise to help strengthen her bones and decrease her risk of osteopenia and osteoporosis.  Obesity Kennetha is currently in the action stage of change. As such, her goal is to continue with weight loss efforts She has agreed to follow the Category 2 plan Sama has been instructed to work up to a goal of 150 minutes of combined cardio and strengthening exercise per week for weight loss and overall health benefits. We discussed the following Behavioral Modification Strategies today: meal planning & cooking strategies and increasing lean protein intake  Lada has agreed to follow up with our clinic in 2 weeks. She was informed of the importance of frequent follow up visits to maximize her success with intensive lifestyle modifications for her multiple health conditions.  I, Doreene Nest, am acting as transcriptionist for Lacy Duverney, PA-C  I have reviewed the above documentation for accuracy and completeness, and I agree with the above. -Lacy Duverney, PA-C  I have reviewed the above note and agree with the plan. -Dennard Nip, MD   OBESITY BEHAVIORAL INTERVENTION VISIT  Today's visit was # 4 out of 22.  Starting weight: 225 lbs Starting date: 10/12/16 Today's weight : 220 lbs  Today's date: 11/23/2016 Total lbs lost to date: 5 (Patients must lose 7 lbs in the first 6 months to continue with counseling)   ASK: We discussed the diagnosis of obesity with Starling Manns today and Reanne agreed to give [place] permission to discuss obesity behavioral modification  therapy today.  ASSESS: Quin has the diagnosis of obesity and her BMI today is 48.74 Shaketta is in the action stage of change   ADVISE: Henryetta was educated on the multiple health risks of obesity as well as the benefit of weight loss to improve her health. She was advised of the need for long term treatment and the importance of lifestyle modifications.  AGREE: Multiple dietary modification options and treatment options were discussed and  Sabrea agreed to follow the Category 2 plan We discussed the following Behavioral Modification Strategies today: meal planning & cooking strategies and increasing lean protein intake

## 2016-11-30 ENCOUNTER — Ambulatory Visit: Payer: Self-pay | Admitting: *Deleted

## 2016-12-08 ENCOUNTER — Ambulatory Visit (INDEPENDENT_AMBULATORY_CARE_PROVIDER_SITE_OTHER): Payer: 59 | Admitting: Physician Assistant

## 2016-12-08 VITALS — BP 129/85 | HR 78 | Temp 98.3°F | Ht 64.0 in | Wt 219.0 lb

## 2016-12-08 DIAGNOSIS — E559 Vitamin D deficiency, unspecified: Secondary | ICD-10-CM

## 2016-12-08 DIAGNOSIS — Z6837 Body mass index (BMI) 37.0-37.9, adult: Secondary | ICD-10-CM

## 2016-12-08 DIAGNOSIS — F3289 Other specified depressive episodes: Secondary | ICD-10-CM | POA: Diagnosis not present

## 2016-12-08 DIAGNOSIS — Z9189 Other specified personal risk factors, not elsewhere classified: Secondary | ICD-10-CM | POA: Diagnosis not present

## 2016-12-08 DIAGNOSIS — E669 Obesity, unspecified: Secondary | ICD-10-CM

## 2016-12-08 DIAGNOSIS — IMO0001 Reserved for inherently not codable concepts without codable children: Secondary | ICD-10-CM

## 2016-12-08 MED ORDER — BUPROPION HCL ER (SR) 150 MG PO TB12: 150.0000 mg | ORAL_TABLET | Freq: Every day | ORAL | 0 refills | Status: DC

## 2016-12-08 MED ORDER — VITAMIN D (ERGOCALCIFEROL) 1.25 MG (50000 UNIT) PO CAPS: 50000.0000 [IU] | ORAL_CAPSULE | ORAL | 0 refills | Status: DC

## 2016-12-08 MED FILL — VIT D2 1.25 MG (50,000 UNIT: 1.25 MG | 28 days supply | Qty: 4 | Fill #0

## 2016-12-08 MED FILL — BUPROPION SR 150 MG TABLET: 150 | 30 days supply | Qty: 30 | Fill #0

## 2016-12-08 NOTE — Progress Notes (Signed)
Office: [phone]  /  Fax: [phone]   HPI:   Chief Complaint: OBESITY Shelley Garcia is here to discuss her progress with her obesity treatment plan. She is on the Category 2 plan and is following her eating plan approximately 85 % of the time. She states she is exercising 0 minutes 0 times per week. Anvitha continues to do well with weight loss. She still admits to hunger and states she does not get full but she is also not following the plan with her protein intake and she does not eat breakfast as instructed. Her weight is 219 lb (99.3 kg) today and has had a weight loss of 1 pound over a period of 3 weeks since her last visit. She has lost 6 lbs since starting treatment with [place].  Vitamin D deficiency Shelley Garcia has a diagnosis of vitamin D deficiency. She is currently taking vit D and denies nausea, vomiting or muscle weakness.  At risk for osteopenia and osteoporosis Shelley Garcia is at higher risk of osteopenia and osteoporosis due to vitamin D deficiency.   Depression with emotional eating behaviors Shelley Garcia is struggling with emotional eating and using food for comfort to the extent that it is negatively impacting her health. She often snacks when she is not hungry. Shelley Garcia sometimes feels she is out of control and then feels guilty that she made poor food choices. She has been working on behavior modification techniques to help reduce her emotional eating and has been somewhat successful. Her mood is stable and she shows no sign of suicidal or homicidal ideations.  Depression screen Shelley Garcia 2/9 10/12/2016 07/27/2016 03/11/2015 07/26/2013  Decreased Interest 1 1 1  0  Down, Depressed, Hopeless 1 0 1 0  PHQ - 2 Score 2 1 2  0  Altered sleeping 1 - 3 -  Tired, decreased energy 3 - 3 -  Change in appetite 1 - 0 -  Feeling bad or failure about yourself  1 - 0 -  Trouble concentrating 3 - 0 -  Moving slowly or fidgety/restless 1 - 0 -  Suicidal thoughts 0 - 1 -  PHQ-9 Score 12 - 9 -       ALLERGIES: Allergies  Allergen Reactions  . Metformin And Related     Made pt really sick  . Codeine Rash    MEDICATIONS: Current Outpatient Prescriptions on File Prior to Visit  Medication Sig Dispense Refill  . aspirin EC 81 MG tablet Take 81 mg by mouth daily.    . Blood Glucose Monitoring Suppl (FREESTYLE LITE) DEVI Use as instructed 2 to 3 times daily to check blood sugar.  Dx E11.8 1 each 0  . cyanocobalamin (,VITAMIN B-12,) 1000 MCG/ML injection Inject 1 mL (1,000 mcg total) into the muscle every 30 (thirty) days. 10 mL 0  . Dulaglutide (TRULICITY) 1.5 HY/0.7PX SOPN Inject 1.5 mg into the skin once a week. 4 pen 0  . empagliflozin (JARDIANCE) 25 MG TABS tablet Take 25 mg by mouth daily. 30 tablet 5  . glucose blood (FREESTYLE LITE) test strip Use as instructed to check blood sugar 2-3 times a day.  Dx  E11.8 100 each 5  . Lancets (FREESTYLE) lancets Use to check blood sugar 2-3 times daily.  Dx E11.8 100 each 5  . losartan (COZAAR) 50 MG tablet Take 1 tablet (50 mg total) by mouth daily. 30 tablet 0  . Multiple Vitamin (MULTIVITAMIN) tablet Take 1 tablet by mouth daily. Reported on 07/22/2015    . pantoprazole (PROTONIX) 40 MG  tablet TAKE 1 TABLET BY MOUTH ONCE DAILY (MAKE APPT FOR FURTHER REFILLS) 30 tablet 5  . Pitavastatin Calcium (LIVALO) 4 MG TABS Take 1 tablet (4 mg total) by mouth daily. 30 tablet 5  . zolpidem (AMBIEN) 10 MG tablet Take 1/2 by mouth at night 15 tablet 2   No current facility-administered medications on file prior to visit.     PAST MEDICAL HISTORY: Past Medical History:  Diagnosis Date  . Back pain   . Diabetes mellitus   . Gallbladder problem   . GERD (gastroesophageal reflux disease)   . History of chicken pox   . Hyperlipidemia   . Hypertension   . Joint pain   . Obesity   . Vitamin B12 deficiency   . Vitamin D deficiency     PAST SURGICAL HISTORY: Past Surgical History:  Procedure Laterality Date  . CHOLECYSTECTOMY    .  EXPLORATORY LAPAROTOMY    . KIDNEY STONE SURGERY    . WISDOM TOOTH EXTRACTION  07/31/2013    SOCIAL HISTORY: Social History  Substance Use Topics  . Smoking status: Former Smoker    Packs/day: 1.00    Types: Cigarettes    Start date: 06/11/2014  . Smokeless tobacco: Never Used     Comment: pt doing the vapor cigerette   . Alcohol use 0.0 oz/week     Comment: rarely    FAMILY HISTORY: Family History  Problem Relation Age of Onset  . Hypertension Father   . Diabetes Father   . Lung cancer Father   . Kidney failure Father        Living  . Hypertension Mother 44       Deceased  . Diabetes Mother   . Mental illness Mother        Schizophrenia  . Stroke Mother   . Bipolar disorder Mother   . Depression Mother   . Anxiety disorder Mother   . Obesity Mother   . Diabetes Sister        Deceased  . Stroke Sister   . Mental illness Brother        Deceased  . Colon cancer Paternal Grandfather   . Diabetes Other        Maternal/Paternal side  . Hypertension Other        Maternal/Paternal side  . Multiple sclerosis Maternal Aunt   . Hypertension Sister   . Healthy Son        x2    ROS: Review of Systems  Constitutional: Positive for weight loss.  Gastrointestinal: Negative for nausea and vomiting.  Musculoskeletal:       Negative muscle weakness  Psychiatric/Behavioral: Positive for depression. Negative for suicidal ideas.    PHYSICAL EXAM: Blood pressure 129/85, pulse 78, temperature 98.3 F (36.8 C), temperature source Oral, height 5\' 4"  (1.626 m), weight 219 lb (99.3 kg), last menstrual period 11/10/2016, SpO2 97 %. Body mass index is 37.59 kg/m. Physical Exam  Constitutional: She is oriented to person, place, and time. She appears well-developed and well-nourished.  Cardiovascular: Normal rate.   Pulmonary/Chest: Effort normal.  Musculoskeletal: Normal range of motion.  Neurological: She is oriented to person, place, and time.  Skin: Skin is warm and dry.   Psychiatric: She has a normal mood and affect. Her behavior is normal.  Vitals reviewed.   RECENT LABS AND TESTS: BMET    Component Value Date/Time   NA 137 10/12/2016 0959   K 4.4 10/12/2016 0959   CL 101 10/12/2016 0959  CO2 18 (L) 10/12/2016 0959   GLUCOSE 234 (H) 10/12/2016 0959   GLUCOSE 271 (H) 05/21/2016 1532   BUN 11 10/12/2016 0959   CREATININE 0.62 10/12/2016 0959   CREATININE 0.64 05/21/2016 1532   CALCIUM 9.4 10/12/2016 0959   GFRNONAA 106 10/12/2016 0959   GFRAA 122 10/12/2016 0959   Lab Results  Component Value Date   HGBA1C 9.1 (H) 10/12/2016   HGBA1C 7.4 07/27/2016   HGBA1C 9.3 (H) 05/21/2016   HGBA1C 8.6 07/22/2015   HGBA1C 9.8 (H) 05/19/2015   Lab Results  Component Value Date   INSULIN 23.2 10/12/2016   CBC    Component Value Date/Time   WBC 7.0 10/12/2016 0959   WBC 12.6 (H) 05/27/2012 2345   RBC 4.85 10/12/2016 0959   RBC 4.79 05/27/2012 2345   HGB 15.1 10/12/2016 0959   HCT 45.4 10/12/2016 0959   PLT 319 10/12/2016 0959   MCV 94 10/12/2016 0959   MCH 31.1 10/12/2016 0959   MCH 31.7 05/27/2012 2345   MCHC 33.3 10/12/2016 0959   MCHC 34.9 05/27/2012 2345   RDW 13.8 10/12/2016 0959   LYMPHSABS 2.3 10/12/2016 0959   MONOABS 0.8 05/27/2012 2345   EOSABS 0.2 10/12/2016 0959   BASOSABS 0.1 10/12/2016 0959   Iron/TIBC/Ferritin/ %Sat No results found for: IRON, TIBC, FERRITIN, IRONPCTSAT Lipid Panel     Component Value Date/Time   CHOL 232 (H) 10/12/2016 0959   TRIG 231 (H) 10/12/2016 0959   HDL 46 10/12/2016 0959   CHOLHDL 5.4 (H) 05/21/2016 1532   VLDL 43 (H) 05/21/2016 1532   LDLCALC 140 (H) 10/12/2016 0959   LDLDIRECT 201.4 02/06/2014 1139   Hepatic Function Panel     Component Value Date/Time   PROT 6.8 10/12/2016 0959   ALBUMIN 4.0 10/12/2016 0959   AST 17 10/12/2016 0959   ALT 21 10/12/2016 0959   ALKPHOS 56 10/12/2016 0959   BILITOT 0.3 10/12/2016 0959      Component Value Date/Time   TSH 6.920 (H) 10/12/2016 0959    TSH 3.734 05/17/2012 0917    ASSESSMENT AND PLAN: Vitamin D deficiency - Plan: Vitamin D, Ergocalciferol, (DRISDOL) 50000 units CAPS capsule  Other depression - With emotional eating - Plan: buPROPion (WELLBUTRIN SR) 150 MG 12 hr tablet  At risk for osteoporosis  Class 2 obesity with serious comorbidity and body mass index (BMI) of 37.0 to 37.9 in adult, unspecified obesity type  PLAN:  Vitamin D Deficiency Shelley Garcia was informed that low vitamin D levels contributes to fatigue and are associated with obesity, breast, and colon cancer. She agrees to continue to take prescription Vit D @50 ,000 IU every week, we will refill for 1 month and will follow up for routine testing of vitamin D, at least 2-3 times per year. She was informed of the risk of over-replacement of vitamin D and agrees to not increase her dose unless he discusses this with [place] first. Shelley Garcia agrees to follow up with our clinic in 3 weeks.  At risk for osteopenia and osteoporosis Shelley Garcia is at risk for osteopenia and osteoporosis due to her vitamin D deficiency. She was encouraged to take her vitamin D and follow her higher calcium diet and increase strengthening exercise to help strengthen her bones and decrease her risk of osteopenia and osteoporosis.  Depression with Emotional Eating Behaviors We discussed behavior modification techniques today to help Shelley Garcia deal with her emotional eating and depression. She has agreed to start to take Wellbutrin SR 150 mg qd #30  with no refills and will follow up as directed.  Obesity Shelley Garcia is currently in the action stage of change. As such, her goal is to continue with weight loss efforts She has agreed to follow the Category 2 plan Shelley Garcia has been instructed to work up to a goal of 150 minutes of combined cardio and strengthening exercise per week for weight loss and overall health benefits. We discussed the following Behavioral Modification Strategies today: increasing lean protein intake  and work on meal planning and easy cooking plans  Latisa has agreed to follow up with our clinic in 3 weeks. She was informed of the importance of frequent follow up visits to maximize her success with intensive lifestyle modifications for her multiple health conditions.  I, Doreene Nest, am acting as transcriptionist for Shelley Duverney, PA-C  I have reviewed the above documentation for accuracy and completeness, and I agree with the above. -Shelley Duverney, PA-C  I have reviewed the above note and agree with the plan. -Dennard Nip, MD   OBESITY BEHAVIORAL INTERVENTION VISIT  Today's visit was # 5 out of 22.  Starting weight: 225 lbs Starting date: 10/12/16 Today's weight : 219 lbs  Today's date: 12/08/2016 Total lbs lost to date: 6 (Patients must lose 7 lbs in the first 6 months to continue with counseling)   ASK: We discussed the diagnosis of obesity with Starling Manns today and Noriah agreed to give [place] permission to discuss obesity behavioral modification therapy today.  ASSESS: Windsor has the diagnosis of obesity and her BMI today is 37.57 Bitania is in the action stage of change   ADVISE: Valissa was educated on the multiple health risks of obesity as well as the benefit of weight loss to improve her health. She was advised of the need for long term treatment and the importance of lifestyle modifications.  AGREE: Multiple dietary modification options and treatment options were discussed and  Leanore agreed to follow the Category 2 plan We discussed the following Behavioral Modification Strategies today: increasing lean protein intake and work on meal planning and easy cooking plans

## 2016-12-21 ENCOUNTER — Other Ambulatory Visit (INDEPENDENT_AMBULATORY_CARE_PROVIDER_SITE_OTHER): Payer: Self-pay | Admitting: Family Medicine

## 2016-12-21 DIAGNOSIS — E119 Type 2 diabetes mellitus without complications: Secondary | ICD-10-CM

## 2016-12-30 ENCOUNTER — Encounter (INDEPENDENT_AMBULATORY_CARE_PROVIDER_SITE_OTHER): Payer: Self-pay

## 2016-12-30 ENCOUNTER — Ambulatory Visit (INDEPENDENT_AMBULATORY_CARE_PROVIDER_SITE_OTHER): Payer: 59 | Admitting: Physician Assistant

## 2017-01-05 MED FILL — TRULICITY 1.5 MG/0.5 ML PEN: 1.5 | 28 days supply | Qty: 2 | Fill #0

## 2017-01-12 ENCOUNTER — Other Ambulatory Visit: Payer: Self-pay | Admitting: *Deleted

## 2017-01-12 NOTE — Patient Outreach (Signed)
Did not receive response from secure email sent to [place] on 10/15 about the need to schedule [facility] follow up appointment so sent her a text to her mobile number- ( her messaging preference) requesting same. Barrington Ellison RN,CCM,CDE [place] Management Coordinator [facility] [employer] [phone] Office Fax [phone]

## 2017-01-19 ENCOUNTER — Other Ambulatory Visit: Payer: Self-pay | Admitting: *Deleted

## 2017-01-19 VITALS — Ht 64.0 in | Wt 220.6 lb

## 2017-01-19 DIAGNOSIS — E119 Type 2 diabetes mellitus without complications: Secondary | ICD-10-CM

## 2017-01-19 LAB — POCT CBG (FASTING - GLUCOSE)-MANUAL ENTRY: GLUCOSE FASTING, POC: 150 mg/dL — AB (ref 70–99)

## 2017-01-19 LAB — POCT GLYCOSYLATED HEMOGLOBIN (HGB A1C): Hemoglobin A1C: 6.8

## 2017-01-19 NOTE — Patient Outreach (Signed)
[facility]) Care Management   01/19/2017  Shelley Garcia [birth year] [id number]  Shelley Garcia is an 51 y.o. female who presents to the [place] Management office for routine Link To Wellness follow up for self management assistance with Type II DM, HTN,  hyperlipidemia and obesity.   Subjective:  Shelley Garcia says she started seeing Dr Leafy Ro at the [facility] in mid July. She said she stopped the ketogenic diet at the advice of both Dr. Leafy Ro and Dr. Cruzita Lederer. She says the current meal plan she is following now leaves her feeling very hungry and that the Wellbutrin does not seem to be helping.  She is requesting a POC Hgb A1C today.   Objective:   Review of Systems  Constitutional: Negative.     Physical Exam  Constitutional: She is oriented to person, place, and time. She appears well-developed and well-nourished.  Respiratory: Effort normal.  Neurological: She is alert and oriented to person, place, and time.  Skin: Skin is warm and dry.  Psychiatric: She has a normal mood and affect. Her behavior is normal. Judgment and thought content normal.   Filed Weights   01/19/17 1506  Weight: 220 lb 9.6 oz (100.1 kg)    POC random CBG= 150 POC Hgb A1C= 6.8%   Outpatient Encounter Prescriptions as of 01/19/2017  Medication Sig Note  . buPROPion (WELLBUTRIN SR) 150 MG 12 hr tablet Take 1 tablet (150 mg total) by mouth daily.   . empagliflozin (JARDIANCE) 25 MG TABS tablet Take 25 mg by mouth daily.   . pantoprazole (PROTONIX) 40 MG tablet TAKE 1 TABLET BY MOUTH ONCE DAILY (MAKE APPT FOR FURTHER REFILLS)   . Pitavastatin Calcium (LIVALO) 4 MG TABS Take 1 tablet (4 mg total) by mouth daily.   . TRULICITY 1.5 AJ/2.8NO SOPN INJECT 1.5 MG (1 PEN) INTO THE SKIN ONCE A WEEK.   . Vitamin D, Ergocalciferol, (DRISDOL) 50000 units CAPS capsule Take 1 capsule (50,000 Units total) by mouth every 7 (seven) days.   Marland Kitchen  zolpidem (AMBIEN) 10 MG tablet Take 1/2 by mouth at night 12/23/2015: Not taking while on neurontin at hs  . aspirin EC 81 MG tablet Take 81 mg by mouth daily. 01/19/17 States not taking  . Blood Glucose Monitoring Suppl (FREESTYLE LITE) DEVI Use as instructed 2 to 3 times daily to check blood sugar.  Dx E11.8   . cyanocobalamin (,VITAMIN B-12,) 1000 MCG/ML injection Inject 1 mL (1,000 mcg total) into the muscle every 30 (thirty) days. (Patient not taking: Reported on 01/19/2017) 01/19/17 States not taking  . glucose blood (FREESTYLE LITE) test strip Use as instructed to check blood sugar 2-3 times a day.  Dx  E11.8 (Patient not taking: Reported on 01/19/2017)   . Lancets (FREESTYLE) lancets Use to check blood sugar 2-3 times daily.  Dx E11.8 (Patient not taking: Reported on 01/19/2017)   . losartan (COZAAR) 50 MG tablet Take 1 tablet (50 mg total) by mouth daily. (Patient not taking: Reported on 01/19/2017)   . Multiple Vitamin (MULTIVITAMIN) tablet Take 1 tablet by mouth daily. Reported on 07/22/2015    No facility-administered encounter medications on file as of 01/19/2017.     Depression screen [facility] 2/9 10/12/2016 07/27/2016 03/11/2015 07/26/2013  Decreased Interest '1 1 1 ' 0  Down, Depressed, Hopeless 1 0 1 0  PHQ - 2 Score '2 1 2 ' 0  Altered sleeping 1 - 3 -  Tired, decreased energy 3 -  3 -  Change in appetite 1 - 0 -  Feeling bad or failure about yourself  1 - 0 -  Trouble concentrating 3 - 0 -  Moving slowly or fidgety/restless 1 - 0 -  Suicidal thoughts 0 - 1 -  PHQ-9 Score 12 - 9 -    Assessment:   [place] RN and Link To Wellness member with obesity with weight loss of 12 lbs in last 6 months, Type II DM, HTN and hyperlipidemia meeting treatment targets for HTN. Currently on ketogenic diet with improved blood sugars and Hgb A1C.  Plan:  [facility] CM Care Plan Problem One        Most Recent Value   Care Plan Problem One  Member with obesity (BMI= 37.85) with weight loss of 12 lbs in last  6 months, Type II DM, HTN and hyperlipidemia. Lipid panel on 10/12/16 abnormal with elevated cholesterol, LDL and triglycerides, previously normal.  BP readings meeting treatment target of <140/90, POC Hgb A1C significantly improved at 6.8% today, previously 9.1% on 10/12/16   Role Documenting the Problem One  Care Management Shelley Garcia for Problem One  Active   [facility] Long Term Goal (31-90 days)  Patient will demonstrate ongoing glycemic management as evidenced by Hgb A1C meeting target of <7.0%, patient will remain active with  the [facility], she will discuss the Freestyle [employer]re flash glucose monitoring system with her provider, lipid panel will show improvement at next assessment, blood pressure readings will remain <140/<90 and Shelley Garcia  will keep follow up appointments with providers and her Link To Wellness appointments   [facility] Long Term Goal Start Date  01/19/17   [facility] Long Term Goal Met Date     Interventions for Problem One Long Term Goal  reviewed medications and medication adherence, discussed progress made with weight management via the  [facility], congratulated Shelley Garcia on her weight loss, encouraged Shelley Garcia to contact Dr. Leafy Ro about her ongoing hunger and cravings,  reviewed all lab work drawn on 10/12/16, demonstrated the [employer] personal flash glucose monitoring system, provided her with information brochure and benefit coverage for the system and encouraged her to ask provider for a prescription for the system, assessed POC CBG and Hgb A1C - discussed results and targets, provided positive reinforcement for improved glycemic management, will arrange for Link To Wellness follow up in January 2019     RNCM to fax today's office visit note to patient's primary care provider and Dr.Gherghe. RNCM will meet quarterly and as needed with patient per Link To Wellness program guidelines to assist with Type II DM, HTN,hyperlipidemia and weight  self-management and assess patient's progress toward mutually set goals.  Barrington Ellison RN,CCM,CDE [place] Management Coordinator Link To Wellness Office Phone [phone] Office Fax [phone]

## 2017-02-10 ENCOUNTER — Other Ambulatory Visit: Payer: Self-pay | Admitting: *Deleted

## 2017-02-10 NOTE — Patient Outreach (Signed)
Spoke to Shelley Garcia via phone advising her  that disease self-management services will be transitioned from the [place] [facility] program to either [place] or [facility] in 2019. Ensured that she has completed the health risk assessment on the [email or web address] website. Also advised her  that a letter will be mailed to her home residence with details of this transition. Shelley Garcia expressed appreciation for the help this RNCM has provided to her over the years in the Link To Wellness program.                                                         Will close case to [employer] To [facility] diabetes program. Barrington Ellison RN,CCM,CDE [place] Management Coordinator Link To Wellness and [employer] [phone] Office Fax [phone]

## 2017-02-15 ENCOUNTER — Ambulatory Visit (INDEPENDENT_AMBULATORY_CARE_PROVIDER_SITE_OTHER): Payer: 59 | Admitting: Physician Assistant

## 2017-02-15 VITALS — BP 131/78 | HR 67 | Temp 98.1°F | Ht 64.0 in | Wt 217.0 lb

## 2017-02-15 DIAGNOSIS — E559 Vitamin D deficiency, unspecified: Secondary | ICD-10-CM | POA: Diagnosis not present

## 2017-02-15 DIAGNOSIS — E119 Type 2 diabetes mellitus without complications: Secondary | ICD-10-CM | POA: Diagnosis not present

## 2017-02-15 DIAGNOSIS — Z9189 Other specified personal risk factors, not elsewhere classified: Secondary | ICD-10-CM | POA: Diagnosis not present

## 2017-02-15 DIAGNOSIS — Z6837 Body mass index (BMI) 37.0-37.9, adult: Secondary | ICD-10-CM

## 2017-02-15 MED ORDER — VITAMIN D (ERGOCALCIFEROL) 1.25 MG (50000 UNIT) PO CAPS: 50000.0000 [IU] | ORAL_CAPSULE | ORAL | 0 refills | Status: DC

## 2017-02-15 MED ORDER — DULAGLUTIDE 1.5 MG/0.5ML ~~LOC~~ SOAJ: SUBCUTANEOUS | 0 refills | Status: DC

## 2017-02-15 MED FILL — TRULICITY 1.5 MG/0.5 ML PEN: 1.5 | 28 days supply | Qty: 2 | Fill #0

## 2017-02-15 MED FILL — VIT D2 1.25 MG (50,000 UNIT: 1.25 MG | 28 days supply | Qty: 4 | Fill #0

## 2017-02-16 NOTE — Progress Notes (Signed)
Office: [phone]  /  Fax: [phone]   HPI:   Chief Complaint: OBESITY Shelley Garcia is here to discuss her progress with her obesity treatment plan. She is on the Category 2 plan and is following her eating plan approximately 60 % of the time. She states she is exercising 0 minutes 0 times per week. Shelley Garcia continues to do well with weight loss. She has last visited with [place] on 12/09/16 and states she had to reschedule her las appointment. She is still struggling with planning ahead her meals and admits that although she tries to be mindful of her eating, she has not been following the meal plan. She request medicine for weight loss and relates that "anything with Phentermine" will help. Her weight is 217 lb (98.4 kg) today and has had a weight loss of 2 pounds over a period of 10 weeks since her last visit. She has lost 8 lbs since starting treatment with [place].  Diabetes II Shelley Garcia has a diagnosis of diabetes type II. Shelley Garcia states she is not checking BGs at home and she denies any hypoglycemic episodes. Last A1c was 6.8 on 01/19/17. She has been working on intensive lifestyle modifications including diet, exercise, and weight loss to help control her blood glucose levels.  Vitamin D deficiency Shelley Garcia has a diagnosis of vitamin D deficiency. She is currently taking vit D and denies nausea, vomiting or muscle weakness.  At risk for osteopenia and osteoporosis Shelley Garcia is at higher risk of osteopenia and osteoporosis due to vitamin D deficiency.      Depression screen Shelley Garcia 2/9 10/12/2016 07/27/2016 03/11/2015 07/26/2013  Decreased Interest 1 1 1  0  Down, Depressed, Hopeless 1 0 1 0  PHQ - 2 Score 2 1 2  0  Altered sleeping 1 - 3 -  Tired, decreased energy 3 - 3 -  Change in appetite 1 - 0 -  Feeling bad or failure about yourself  1 - 0 -  Trouble concentrating 3 - 0 -  Moving slowly or fidgety/restless 1 - 0 -  Suicidal thoughts 0 - 1 -  PHQ-9 Score 12 - 9 -     ALLERGIES: Allergies  Allergen  Reactions  . Metformin And Related     Made pt really sick  . Codeine Rash    MEDICATIONS: Current Outpatient Medications on File Prior to Visit  Medication Sig Dispense Refill  . aspirin EC 81 MG tablet Take 81 mg by mouth daily.    . Blood Glucose Monitoring Suppl (FREESTYLE LITE) DEVI Use as instructed 2 to 3 times daily to check blood sugar.  Dx E11.8 1 each 0  . buPROPion (WELLBUTRIN SR) 150 MG 12 hr tablet Take 1 tablet (150 mg total) by mouth daily. 30 tablet 0  . cyanocobalamin (,VITAMIN B-12,) 1000 MCG/ML injection Inject 1 mL (1,000 mcg total) into the muscle every 30 (thirty) days. 10 mL 0  . empagliflozin (JARDIANCE) 25 MG TABS tablet Take 25 mg by mouth daily. 30 tablet 5  . glucose blood (FREESTYLE LITE) test strip Use as instructed to check blood sugar 2-3 times a day.  Dx  E11.8 100 each 5  . Lancets (FREESTYLE) lancets Use to check blood sugar 2-3 times daily.  Dx E11.8 100 each 5  . losartan (COZAAR) 50 MG tablet Take 1 tablet (50 mg total) by mouth daily. 30 tablet 0  . Multiple Vitamin (MULTIVITAMIN) tablet Take 1 tablet by mouth daily. Reported on 07/22/2015    . pantoprazole (PROTONIX) 40 MG  tablet TAKE 1 TABLET BY MOUTH ONCE DAILY (MAKE APPT FOR FURTHER REFILLS) 30 tablet 5  . Pitavastatin Calcium (LIVALO) 4 MG TABS Take 1 tablet (4 mg total) by mouth daily. 30 tablet 5  . zolpidem (AMBIEN) 10 MG tablet Take 1/2 by mouth at night 15 tablet 2   No current facility-administered medications on file prior to visit.     PAST MEDICAL HISTORY: Past Medical History:  Diagnosis Date  . Back pain   . Diabetes mellitus   . Gallbladder problem   . GERD (gastroesophageal reflux disease)   . History of chicken pox   . Hyperlipidemia   . Hypertension   . Joint pain   . Obesity   . Vitamin B12 deficiency   . Vitamin D deficiency     PAST SURGICAL HISTORY: Past Surgical History:  Procedure Laterality Date  . CHOLECYSTECTOMY    . EXPLORATORY LAPAROTOMY    . KIDNEY  STONE SURGERY    . WISDOM TOOTH EXTRACTION  07/31/2013    SOCIAL HISTORY: Social History   Tobacco Use  . Smoking status: Former Smoker    Packs/day: 1.00    Types: Cigarettes    Start date: 06/11/2014  . Smokeless tobacco: Never Used  . Tobacco comment: pt doing the vapor cigerette   Substance Use Topics  . Alcohol use: Yes    Alcohol/week: 0.0 oz    Comment: rarely  . Drug use: No    FAMILY HISTORY: Family History  Problem Relation Age of Onset  . Hypertension Father   . Diabetes Father   . Lung cancer Father   . Kidney failure Father        Living  . Hypertension Mother 53       Deceased  . Diabetes Mother   . Mental illness Mother        Schizophrenia  . Stroke Mother   . Bipolar disorder Mother   . Depression Mother   . Anxiety disorder Mother   . Obesity Mother   . Diabetes Sister        Deceased  . Stroke Sister   . Mental illness Brother        Deceased  . Colon cancer Paternal Grandfather   . Diabetes Other        Maternal/Paternal side  . Hypertension Other        Maternal/Paternal side  . Multiple sclerosis Maternal Aunt   . Hypertension Sister   . Healthy Son        x2    ROS: Review of Systems  Constitutional: Positive for weight loss.  Gastrointestinal: Negative for nausea and vomiting.  Musculoskeletal:       Negative muscle weakness  Endo/Heme/Allergies:       Negative hypoglycemia     PHYSICAL EXAM: Blood pressure 131/78, pulse 67, temperature 98.1 F (36.7 C), temperature source Oral, height 5\' 4"  (1.626 m), weight 217 lb (98.4 kg), SpO2 97 %. Body mass index is 37.25 kg/m. Physical Exam  Constitutional: She is oriented to person, place, and time. She appears well-developed and well-nourished.  Cardiovascular: Normal rate.  Pulmonary/Chest: Effort normal.  Musculoskeletal: Normal range of motion.  Neurological: She is oriented to person, place, and time.  Skin: Skin is warm and dry.  Psychiatric: She has a normal mood and  affect.  Vitals reviewed.   RECENT LABS AND TESTS: BMET    Component Value Date/Time   NA 137 10/12/2016 0959   K 4.4 10/12/2016 0959  CL 101 10/12/2016 0959   CO2 18 (L) 10/12/2016 0959   GLUCOSE 234 (H) 10/12/2016 0959   GLUCOSE 271 (H) 05/21/2016 1532   BUN 11 10/12/2016 0959   CREATININE 0.62 10/12/2016 0959   CREATININE 0.64 05/21/2016 1532   CALCIUM 9.4 10/12/2016 0959   GFRNONAA 106 10/12/2016 0959   GFRAA 122 10/12/2016 0959   Lab Results  Component Value Date   HGBA1C 6.8 01/19/2017   HGBA1C 9.1 (H) 10/12/2016   HGBA1C 7.4 07/27/2016   HGBA1C 9.3 (H) 05/21/2016   HGBA1C 8.6 07/22/2015   Lab Results  Component Value Date   INSULIN 23.2 10/12/2016   CBC    Component Value Date/Time   WBC 7.0 10/12/2016 0959   WBC 12.6 (H) 05/27/2012 2345   RBC 4.85 10/12/2016 0959   RBC 4.79 05/27/2012 2345   HGB 15.1 10/12/2016 0959   HCT 45.4 10/12/2016 0959   PLT 319 10/12/2016 0959   MCV 94 10/12/2016 0959   MCH 31.1 10/12/2016 0959   MCH 31.7 05/27/2012 2345   MCHC 33.3 10/12/2016 0959   MCHC 34.9 05/27/2012 2345   RDW 13.8 10/12/2016 0959   LYMPHSABS 2.3 10/12/2016 0959   MONOABS 0.8 05/27/2012 2345   EOSABS 0.2 10/12/2016 0959   BASOSABS 0.1 10/12/2016 0959   Iron/TIBC/Ferritin/ %Sat No results found for: IRON, TIBC, FERRITIN, IRONPCTSAT Lipid Panel     Component Value Date/Time   CHOL 232 (H) 10/12/2016 0959   TRIG 231 (H) 10/12/2016 0959   HDL 46 10/12/2016 0959   CHOLHDL 5.4 (H) 05/21/2016 1532   VLDL 43 (H) 05/21/2016 1532   LDLCALC 140 (H) 10/12/2016 0959   LDLDIRECT 201.4 02/06/2014 1139   Hepatic Function Panel     Component Value Date/Time   PROT 6.8 10/12/2016 0959   ALBUMIN 4.0 10/12/2016 0959   AST 17 10/12/2016 0959   ALT 21 10/12/2016 0959   ALKPHOS 56 10/12/2016 0959   BILITOT 0.3 10/12/2016 0959      Component Value Date/Time   TSH 6.920 (H) 10/12/2016 0959   TSH 3.734 05/17/2012 0917    ASSESSMENT AND PLAN: Type 2  diabetes mellitus without complication, without long-term current use of insulin (HCC) - Plan: Dulaglutide (Shelley Garcia) 1.5 BT/5.1VO SOPN  Vitamin D deficiency - Plan: Vitamin D, Ergocalciferol, (Shelley Garcia) 50000 units CAPS capsule  Class 2 severe obesity with serious comorbidity and body mass index (BMI) of 37.0 to 37.9 in adult, unspecified obesity type (Shelley Garcia)  PLAN:  Diabetes II Shelley Garcia has been given extensive diabetes education by myself today including ideal fasting and post-prandial blood glucose readings, individual ideal Hgb A1c goals and hypoglycemia prevention. We discussed the importance of good blood sugar control to decrease the likelihood of diabetic complications such as nephropathy, neuropathy, limb loss, blindness, coronary artery disease, and death. We discussed the importance of intensive lifestyle modification including diet, exercise and weight loss as the first line treatment for diabetes. Shelley Garcia agrees to continue Shelley Garcia 1.5 1 pen q weekly and we will refill for 1 month.  Vitamin D Deficiency Shelley Garcia was informed that low vitamin D levels contributes to fatigue and are associated with obesity, breast, and colon cancer. She agrees to continue to take prescription Vit D @50 ,000 IU every week #4 and will follow up for routine testing of vitamin D, at least 2-3 times per year. She was informed of the risk of over-replacement of vitamin D and agrees to not increase her dose unless he discusses this with [place] first.  At  risk for osteopenia and osteoporosis Shelley Garcia is at risk for osteopenia and osteoporsis due to her vitamin D deficiency. She was encouraged to take her vitamin D and follow her higher calcium diet and increase strengthening exercise to help strengthen her bones and decrease her risk of osteopenia and osteoporosis.  Obesity Shelley Garcia is currently in the action stage of change. As such, her goal is to continue with weight loss efforts She has agreed to change to keep a food  journal with 1200 calories and 85 grams of protein daily Shelley Garcia has been instructed to work up to a goal of 150 minutes of combined cardio and strengthening exercise per week for weight loss and overall health benefits. We discussed the following Behavioral Modification Strategies today: increasing lean protein intake and work on meal planning and easy cooking plans. Savina requests to start Qsymia. Dr. Leafy Ro reviewed the criteria for starting and continuing any phentermine-containing medicine with Shelley Garcia. Shelley Garcia is made aware of the potential risk associated with Qsyemia. Shelley Garcia signed the Phentermine contract, which is added to her medical record. Shelley Garcia agrees to follow up with our clinic in 2 weeks.    Shelley Garcia has agreed to follow up with our clinic in 2 weeks. She was informed of the importance of frequent follow up visits to maximize her success with intensive lifestyle modifications for her multiple health conditions.  I, Shelley Dredge, am acting as transcriptionist for Lacy Duverney, PA-C  I have reviewed the above documentation for accuracy and completeness, and I agree with the above. -Lacy Duverney, PA-C  I have reviewed the above note and agree with the plan. -Dennard Nip, MD     Today's visit was # 6 out of 22.  Starting weight: 225 lbs Starting date: 10/12/16 Today's weight : 217 lbs  Today's date: 02/15/2017 Total lbs lost to date: 8 (Patients must lose 7 lbs in the first 6 months to continue with counseling)   ASK: We discussed the diagnosis of obesity with Starling Manns today and Leylani agreed to give [place] permission to discuss obesity behavioral modification therapy today.  ASSESS: Lyvia has the diagnosis of obesity and her BMI today is 37.23 Vita is in the action stage of change   ADVISE: Shatora was educated on the multiple health risks of obesity as well as the benefit of weight loss to improve her health. She was advised of the need for long term treatment and the  importance of lifestyle modifications.  AGREE: Multiple dietary modification options and treatment options were discussed and  Bassy agreed to keep a food journal with 1200 calories and 85 grams of protein daily We discussed the following Behavioral Modification Strategies today: increasing lean protein intake and work on meal planning and easy cooking plans

## 2017-03-01 ENCOUNTER — Encounter (INDEPENDENT_AMBULATORY_CARE_PROVIDER_SITE_OTHER): Payer: Self-pay

## 2017-03-02 ENCOUNTER — Ambulatory Visit (INDEPENDENT_AMBULATORY_CARE_PROVIDER_SITE_OTHER): Payer: 59 | Admitting: Family Medicine

## 2017-03-02 VITALS — BP 138/82 | HR 68 | Temp 97.5°F | Ht 64.0 in | Wt 220.0 lb

## 2017-03-02 DIAGNOSIS — Z9189 Other specified personal risk factors, not elsewhere classified: Secondary | ICD-10-CM | POA: Diagnosis not present

## 2017-03-02 DIAGNOSIS — E7849 Other hyperlipidemia: Secondary | ICD-10-CM

## 2017-03-02 DIAGNOSIS — R7989 Other specified abnormal findings of blood chemistry: Secondary | ICD-10-CM | POA: Diagnosis not present

## 2017-03-02 DIAGNOSIS — Z6837 Body mass index (BMI) 37.0-37.9, adult: Secondary | ICD-10-CM | POA: Diagnosis not present

## 2017-03-02 DIAGNOSIS — I10 Essential (primary) hypertension: Secondary | ICD-10-CM | POA: Diagnosis not present

## 2017-03-02 DIAGNOSIS — E538 Deficiency of other specified B group vitamins: Secondary | ICD-10-CM

## 2017-03-02 DIAGNOSIS — E559 Vitamin D deficiency, unspecified: Secondary | ICD-10-CM

## 2017-03-02 DIAGNOSIS — E119 Type 2 diabetes mellitus without complications: Secondary | ICD-10-CM | POA: Diagnosis not present

## 2017-03-02 HISTORY — DX: Other specified abnormal findings of blood chemistry: R79.89

## 2017-03-02 HISTORY — DX: Other hyperlipidemia: E78.49

## 2017-03-02 MED FILL — QSYMIA 3.75 MG-23 MG CAP: 3.75-23 | 14 days supply | Qty: 14 | Fill #0

## 2017-03-02 NOTE — Progress Notes (Signed)
Office: [phone]  /  Fax: [phone]   HPI:   Chief Complaint: OBESITY Shelley Garcia is here to discuss her progress with her obesity treatment plan. She is on the keep a food journal with 1200 calories and 85+ grams of protein daily and is following her eating plan approximately 75 % of the time. She states she is exercising 0 minutes 0 times per week. Shelley Garcia was weighed on a different scale which reads approximately 3 pounds heavier. So she has maintained her weight actually.  Her weight is 220 lb (99.8 kg) today and has not lost weight since her last visit. She has lost 5 lbs since starting treatment with [place].  Diabetes II Shelley Garcia has a diagnosis of diabetes type II. Shelley Garcia's A1c has greatly improved from 9.1 to 6.8 with diet, exercise, and weight loss. She is not checking her BGs at home and she denies any hypoglycemic episodes. She has been working on intensive lifestyle modifications including diet, exercise, and weight loss to help control her blood glucose levels.  Elevated TSH Shelley Garcia has a diagnosis of elevated TSH based on last lab. She notes fatigue and no history of hypothyroid.  Vitamin D deficiency Shelley Garcia has a diagnosis of vitamin D deficiency. She is on prescription Vit D, based on last lab she is not yet at goal. She notes fatigue and denies nausea, vomiting or muscle weakness.  Vitamin B12 Deficiency Shelley Garcia has a diagnosis of B12 insufficiency. She is attempting to improve with B12 rich diet and she still notes fatigue.  Hyperlipidemia Shelley Garcia has hyperlipidemia and has been trying to improve her cholesterol levels with intensive lifestyle modification including a low saturated fat diet, exercise and weight loss. She is on Livalo and she denies any chest pain, claudication or myalgias.  Hypertension Shelley Garcia is a 51 y.o. female with hypertension. Shelley Garcia is on medications and blood pressure is stable today denies chest pain or shortness of breath. She is working weight loss  to help control her blood pressure with the goal of decreasing her risk of heart attack and stroke. Shelley Garcia blood pressure is not currently controlled.  At risk for cardiovascular disease Shelley Garcia is at a higher than average risk for cardiovascular disease due to obesity, hyperlipidemia, and hypertension. She currently denies any chest pain.  ALLERGIES: Allergies  Allergen Reactions  . Metformin And Related     Made pt really sick  . Codeine Rash    MEDICATIONS: Current Outpatient Medications on File Prior to Visit  Medication Sig Dispense Refill  . aspirin EC 81 MG tablet Take 81 mg by mouth daily.    . Blood Glucose Monitoring Suppl (FREESTYLE LITE) DEVI Use as instructed 2 to 3 times daily to check blood sugar.  Dx E11.8 1 each 0  . buPROPion (WELLBUTRIN SR) 150 MG 12 hr tablet Take 1 tablet (150 mg total) by mouth daily. 30 tablet 0  . cyanocobalamin (,VITAMIN B-12,) 1000 MCG/ML injection Inject 1 mL (1,000 mcg total) into the muscle every 30 (thirty) days. 10 mL 0  . Dulaglutide (TRULICITY) 1.5 KG/2.5KY SOPN INJECT 1.5 MG (1 PEN) INTO THE SKIN ONCE A WEEK. 4 pen 0  . empagliflozin (JARDIANCE) 25 MG TABS tablet Take 25 mg by mouth daily. 30 tablet 5  . glucose blood (FREESTYLE LITE) test strip Use as instructed to check blood sugar 2-3 times a day.  Dx  E11.8 100 each 5  . Lancets (FREESTYLE) lancets Use to check blood sugar 2-3 times daily.  Dx E11.8  100 each 5  . losartan (COZAAR) 50 MG tablet Take 1 tablet (50 mg total) by mouth daily. 30 tablet 0  . Multiple Vitamin (MULTIVITAMIN) tablet Take 1 tablet by mouth daily. Reported on 07/22/2015    . pantoprazole (PROTONIX) 40 MG tablet TAKE 1 TABLET BY MOUTH ONCE DAILY (MAKE APPT FOR FURTHER REFILLS) 30 tablet 5  . Pitavastatin Calcium (LIVALO) 4 MG TABS Take 1 tablet (4 mg total) by mouth daily. 30 tablet 5  . Vitamin D, Ergocalciferol, (DRISDOL) 50000 units CAPS capsule Take 1 capsule (50,000 Units total) by mouth every 7 (seven) days. 4  capsule 0  . zolpidem (AMBIEN) 10 MG tablet Take 1/2 by mouth at night 15 tablet 2   No current facility-administered medications on file prior to visit.     PAST MEDICAL HISTORY: Past Medical History:  Diagnosis Date  . Back pain   . Diabetes mellitus   . Gallbladder problem   . GERD (gastroesophageal reflux disease)   . History of chicken pox   . Hyperlipidemia   . Hypertension   . Joint pain   . Obesity   . Vitamin B12 deficiency   . Vitamin D deficiency     PAST SURGICAL HISTORY: Past Surgical History:  Procedure Laterality Date  . CHOLECYSTECTOMY    . EXPLORATORY LAPAROTOMY    . KIDNEY STONE SURGERY    . WISDOM TOOTH EXTRACTION  07/31/2013    SOCIAL HISTORY: Social History   Tobacco Use  . Smoking status: Former Smoker    Packs/day: 1.00    Types: Cigarettes    Start date: 06/11/2014  . Smokeless tobacco: Never Used  . Tobacco comment: pt doing the vapor cigerette   Substance Use Topics  . Alcohol use: Yes    Alcohol/week: 0.0 oz    Comment: rarely  . Drug use: No    FAMILY HISTORY: Family History  Problem Relation Age of Onset  . Hypertension Father   . Diabetes Father   . Lung cancer Father   . Kidney failure Father        Living  . Hypertension Mother 50       Deceased  . Diabetes Mother   . Mental illness Mother        Schizophrenia  . Stroke Mother   . Bipolar disorder Mother   . Depression Mother   . Anxiety disorder Mother   . Obesity Mother   . Diabetes Sister        Deceased  . Stroke Sister   . Mental illness Brother        Deceased  . Colon cancer Paternal Grandfather   . Diabetes Other        Maternal/Paternal side  . Hypertension Other        Maternal/Paternal side  . Multiple sclerosis Maternal Aunt   . Hypertension Sister   . Healthy Son        x2    ROS: Review of Systems  Constitutional: Positive for malaise/fatigue. Negative for weight loss.  Respiratory: Negative for shortness of breath.   Cardiovascular:  Negative for chest pain and claudication.  Gastrointestinal: Negative for nausea and vomiting.  Musculoskeletal: Negative for myalgias.       Negative muscle weakness    PHYSICAL EXAM: Blood pressure 138/82, pulse 68, temperature (!) 97.5 F (36.4 C), temperature source Oral, height 5\' 4"  (1.626 m), weight 220 lb (99.8 kg), last menstrual period 01/31/2017, SpO2 99 %. Body mass index is 37.76 kg/m. Physical  Exam  Constitutional: She is oriented to person, place, and time. She appears well-developed and well-nourished.  Cardiovascular: Normal rate.  Pulmonary/Chest: Effort normal.  Musculoskeletal: Normal range of motion.  Neurological: She is oriented to person, place, and time.  Skin: Skin is warm and dry.  Psychiatric: She has a normal mood and affect. Her behavior is normal.  Vitals reviewed.   RECENT LABS AND TESTS: BMET    Component Value Date/Time   NA 137 10/12/2016 0959   K 4.4 10/12/2016 0959   CL 101 10/12/2016 0959   CO2 18 (L) 10/12/2016 0959   GLUCOSE 234 (H) 10/12/2016 0959   GLUCOSE 271 (H) 05/21/2016 1532   BUN 11 10/12/2016 0959   CREATININE 0.62 10/12/2016 0959   CREATININE 0.64 05/21/2016 1532   CALCIUM 9.4 10/12/2016 0959   GFRNONAA 106 10/12/2016 0959   GFRAA 122 10/12/2016 0959   Lab Results  Component Value Date   HGBA1C 6.8 01/19/2017   HGBA1C 9.1 (H) 10/12/2016   HGBA1C 7.4 07/27/2016   HGBA1C 9.3 (H) 05/21/2016   HGBA1C 8.6 07/22/2015   Lab Results  Component Value Date   INSULIN 23.2 10/12/2016   CBC    Component Value Date/Time   WBC 7.0 10/12/2016 0959   WBC 12.6 (H) 05/27/2012 2345   RBC 4.85 10/12/2016 0959   RBC 4.79 05/27/2012 2345   HGB 15.1 10/12/2016 0959   HCT 45.4 10/12/2016 0959   PLT 319 10/12/2016 0959   MCV 94 10/12/2016 0959   MCH 31.1 10/12/2016 0959   MCH 31.7 05/27/2012 2345   MCHC 33.3 10/12/2016 0959   MCHC 34.9 05/27/2012 2345   RDW 13.8 10/12/2016 0959   LYMPHSABS 2.3 10/12/2016 0959   MONOABS 0.8  05/27/2012 2345   EOSABS 0.2 10/12/2016 0959   BASOSABS 0.1 10/12/2016 0959   Iron/TIBC/Ferritin/ %Sat No results found for: IRON, TIBC, FERRITIN, IRONPCTSAT Lipid Panel     Component Value Date/Time   CHOL 232 (H) 10/12/2016 0959   TRIG 231 (H) 10/12/2016 0959   HDL 46 10/12/2016 0959   CHOLHDL 5.4 (H) 05/21/2016 1532   VLDL 43 (H) 05/21/2016 1532   LDLCALC 140 (H) 10/12/2016 0959   LDLDIRECT 201.4 02/06/2014 1139   Hepatic Function Panel     Component Value Date/Time   PROT 6.8 10/12/2016 0959   ALBUMIN 4.0 10/12/2016 0959   AST 17 10/12/2016 0959   ALT 21 10/12/2016 0959   ALKPHOS 56 10/12/2016 0959   BILITOT 0.3 10/12/2016 0959      Component Value Date/Time   TSH 6.920 (H) 10/12/2016 0959   TSH 3.734 05/17/2012 0917    ASSESSMENT AND PLAN: Type 2 diabetes mellitus without complication, without long-term current use of insulin (Shelley Garcia) - Plan: Comprehensive metabolic panel, Insulin, random, Microalbumin / creatinine urine ratio  Elevated TSH - Plan: T3, T4, free, TSH  Vitamin D deficiency - Plan: VITAMIN D 25 Hydroxy (Vit-D Deficiency, Fractures)  B12 deficiency - Plan: Vitamin B12  Other hyperlipidemia - Plan: Lipid Panel With LDL/HDL Ratio  Essential hypertension  At risk for heart disease  Class 2 severe obesity with serious comorbidity and body mass index (BMI) of 37.0 to 37.9 in adult, unspecified obesity type ([place])  PLAN:  Diabetes II Shelley Garcia has been given extensive diabetes education by myself today including ideal fasting and post-prandial blood glucose readings, individual ideal Hgb A1c goals and hypoglycemia prevention. We discussed the importance of good blood sugar control to decrease the likelihood of diabetic complications such as  nephropathy, neuropathy, limb loss, blindness, coronary artery disease, and death. We discussed the importance of intensive lifestyle modification including diet, exercise and weight loss as the first line treatment for  diabetes. Shelley Garcia agrees to continue her diabetes medications as prescribed and continue with diet. We will check labs and Shelley Garcia agrees to follow up with our clinic in 2 weeks.  Elevated TSH Shelley Garcia was informed of the importance of good thyroid control to help with weight loss efforts. She was also informed that supertheraputic thyroid levels are dangerous and will not improve weight loss results. We will check labs and Shelley Garcia agrees to follow up with our clinic in 2 weeks.   Vitamin D Deficiency Shelley Garcia was informed that low vitamin D levels contributes to fatigue and are associated with obesity, breast, and colon cancer. Shelley Garcia agrees to continue taking prescription Vit D @50 ,000 IU every week #4 and will follow up for routine testing of vitamin D, at least 2-3 times per year. She was informed of the risk of over-replacement of vitamin D and agrees to not increase her dose unless he discusses this with [place] first. We will check labs and Shelley Garcia agrees to follow up with our clinic in 2 weeks.  Vitamin B12 Deficiency Shelley Garcia will work on increasing B12 rich foods in her diet. B12 supplementation was not prescribed today. We will check labs and Shelley Garcia agrees to follow up with our clinic in 2 weeks.  Hyperlipidemia Shelley Garcia was informed of the American Heart Association Guidelines emphasizing intensive lifestyle modifications as the first line treatment for hyperlipidemia. We discussed many lifestyle modifications today in depth, and Shelley Garcia will continue to work on decreasing saturated fats such as fatty red meat, butter and many fried foods. She will also increase vegetables and lean protein in her diet and continue to work on diet, exercise, and weight loss efforts. We will check labs and Shelley Garcia agrees to follow up with our clinic in 2 weeks.  Hypertension We discussed sodium restriction, working on healthy weight loss, and a regular exercise program as the means to achieve improved blood pressure control. Shelley Garcia agreed  with this plan and agreed to follow up as directed. We will continue to monitor her blood pressure as well as her progress with the above lifestyle modifications. She will continue her medications as prescribed and will watch for signs of hypotension as she continues her lifestyle modifications. We will check labs and Shelley Garcia agrees to follow up with our clinic in 2 weeks.  Cardiovascular risk counselling Shelley Garcia was given extended (15 minutes) coronary artery disease prevention counseling today. She is 51 y.o. female and has risk factors for heart disease including obesity, hyperlipidemia, and hypertension. We discussed intensive lifestyle modifications today with an emphasis on specific weight loss instructions and strategies. Pt was also informed of the importance of increasing exercise and decreasing saturated fats to help prevent heart disease.  Obesity Shelley Garcia is currently in the action stage of change. As such, her goal is to continue with weight loss efforts She has agreed to keep a food journal with 1200 calories and 85+ grams of protein daily Lucresha has been instructed to work up to a goal of 150 minutes of combined cardio and strengthening exercise per week for weight loss and overall health benefits. We discussed the following Behavioral Modification Strategies today: increasing lean protein intake, decreasing simple carbohydrates  and holiday eating strategies    Eowyn has agreed to follow up with our clinic in 2 weeks. She was informed of the  importance of frequent follow up visits to maximize her success with intensive lifestyle modifications for her multiple health conditions.  I, Trixie Dredge, am acting as transcriptionist for Dennard Nip, MD  I have reviewed the above documentation for accuracy and completeness, and I agree with the above. -Dennard Nip, MD     Today's visit was # 7 out of 22.  Starting weight: 225 lbs Starting date: 10/12/16 Today's weight : 220 lbs  Today's  date: 03/02/2017 Total lbs lost to date: 5 (Patients must lose 7 lbs in the first 6 months to continue with counseling)   ASK: We discussed the diagnosis of obesity with Starling Manns today and Brynnly agreed to give [place] permission to discuss obesity behavioral modification therapy today.  ASSESS: Annalucia has the diagnosis of obesity and her BMI today is 37.74 Korayma is in the action stage of change   ADVISE: Ozie was educated on the multiple health risks of obesity as well as the benefit of weight loss to improve her health. She was advised of the need for long term treatment and the importance of lifestyle modifications.  AGREE: Multiple dietary modification options and treatment options were discussed and  Sacoya agreed to keep a food journal with 1200 calories and 85+ grams of protein daily We discussed the following Behavioral Modification Strategies today: increasing lean protein intake, decreasing simple carbohydrates  and holiday eating strategies

## 2017-03-03 ENCOUNTER — Ambulatory Visit (INDEPENDENT_AMBULATORY_CARE_PROVIDER_SITE_OTHER): Payer: 59 | Admitting: Family Medicine

## 2017-03-03 ENCOUNTER — Encounter: Payer: Self-pay | Admitting: Family Medicine

## 2017-03-03 VITALS — BP 122/78 | HR 98 | Temp 97.9°F | Ht 64.0 in | Wt 222.4 lb

## 2017-03-03 DIAGNOSIS — M546 Pain in thoracic spine: Secondary | ICD-10-CM | POA: Diagnosis not present

## 2017-03-03 LAB — COMPREHENSIVE METABOLIC PANEL
A/G RATIO: 1.6 (ref 1.2–2.2)
ALT: 14 IU/L (ref 0–32)
AST: 14 IU/L (ref 0–40)
Albumin: 4.2 g/dL (ref 3.5–5.5)
Alkaline Phosphatase: 56 IU/L (ref 39–117)
BILIRUBIN TOTAL: 0.4 mg/dL (ref 0.0–1.2)
BUN/Creatinine Ratio: 21 (ref 9–23)
BUN: 15 mg/dL (ref 6–24)
CALCIUM: 9.1 mg/dL (ref 8.7–10.2)
CHLORIDE: 101 mmol/L (ref 96–106)
CO2: 24 mmol/L (ref 20–29)
Creatinine, Ser: 0.73 mg/dL (ref 0.57–1.00)
GFR calc Af Amer: 112 mL/min/{1.73_m2} (ref >59)
GFR, EST NON AFRICAN AMERICAN: 97 mL/min/{1.73_m2} (ref >59)
GLUCOSE: 150 mg/dL — AB (ref 65–99)
Globulin, Total: 2.7 g/dL (ref 1.5–4.5)
POTASSIUM: 4.8 mmol/L (ref 3.5–5.2)
Sodium: 141 mmol/L (ref 134–144)
Total Protein: 6.9 g/dL (ref 6.0–8.5)

## 2017-03-03 LAB — LIPID PANEL WITH LDL/HDL RATIO
Cholesterol, Total: 213 mg/dL — ABNORMAL HIGH (ref 100–199)
HDL: 47 mg/dL (ref >39)
LDL Calculated: 130 mg/dL — ABNORMAL HIGH (ref 0–99)
LDl/HDL Ratio: 2.8 ratio (ref 0.0–3.2)
TRIGLYCERIDES: 182 mg/dL — AB (ref 0–149)
VLDL Cholesterol Cal: 36 mg/dL (ref 5–40)

## 2017-03-03 LAB — MICROALBUMIN / CREATININE URINE RATIO
CREATININE, UR: 76.4 mg/dL
Microalb/Creat Ratio: <3.9 mg/g creat (ref 0.0–30.0)

## 2017-03-03 LAB — INSULIN, RANDOM: INSULIN: 25.3 u[IU]/mL — ABNORMAL HIGH (ref 2.6–24.9)

## 2017-03-03 LAB — T3: T3 TOTAL: 89 ng/dL (ref 71–180)

## 2017-03-03 LAB — TSH: TSH: 4.35 u[IU]/mL (ref 0.450–4.500)

## 2017-03-03 LAB — VITAMIN D 25 HYDROXY (VIT D DEFICIENCY, FRACTURES): Vit D, 25-Hydroxy: 20.2 ng/mL — ABNORMAL LOW (ref 30.0–100.0)

## 2017-03-03 LAB — VITAMIN B12: Vitamin B-12: 200 pg/mL — ABNORMAL LOW (ref 232–1245)

## 2017-03-03 LAB — T4, FREE: FREE T4: 0.95 ng/dL (ref 0.82–1.77)

## 2017-03-03 MED ORDER — NAPROXEN 500 MG PO TABS: 500.0000 mg | ORAL_TABLET | Freq: Two times a day (BID) | ORAL | 1 refills | Status: DC

## 2017-03-03 MED ORDER — TIZANIDINE HCL 4 MG PO TABS: 4.0000 mg | ORAL_TABLET | Freq: Three times a day (TID) | ORAL | 0 refills | Status: DC | PRN

## 2017-03-03 MED ORDER — KETOROLAC TROMETHAMINE 60 MG/2ML IM SOLN
60.0000 mg | Freq: Once | INTRAMUSCULAR | Status: AC
  Administered 2017-03-03: 60 mg via INTRAMUSCULAR

## 2017-03-03 MED FILL — NAPROXEN 500 MG TABLET: 500 | 15 days supply | Qty: 30 | Fill #0

## 2017-03-03 MED FILL — tiZANidine HCL 4 MG TABS: 4 | 10 days supply | Qty: 30 | Fill #0

## 2017-03-03 NOTE — Progress Notes (Signed)
Musculoskeletal Exam  Patient: Shelley Garcia DOB: Oct 13, 1965  DOS: 03/03/2017  SUBJECTIVE:  Chief Complaint:   Chief Complaint  Patient presents with  . Back Pain    Shelley Garcia is a 51 y.o.  female for evaluation and treatment of her back pain.   Onset:  1 week ago. No injury or change in activity.  Location: middle thoracic Character:  aching and sharp  Progression of issue:  is moderately worse Associated symptoms: none Denies bowel/bladder incontinence or weakness Treatment: to date has been OTC NSAIDS.   Neurovascular symptoms: no  ROS: Gastrointestinal: no bowel incontinence Genitourinary: No bladder incontinence Musculoskeletal/Extremities: +back pain Neurologic: no numbness, tingling no weakness   Past Medical History:  Diagnosis Date  . Back pain   . Diabetes mellitus   . Gallbladder problem   . GERD (gastroesophageal reflux disease)   . History of chicken pox   . Hyperlipidemia   . Hypertension   . Joint pain   . Obesity   . Vitamin B12 deficiency   . Vitamin D deficiency    Past Surgical History:  Procedure Laterality Date  . CHOLECYSTECTOMY    . EXPLORATORY LAPAROTOMY    . KIDNEY STONE SURGERY    . WISDOM TOOTH EXTRACTION  07/31/2013   Family History  Problem Relation Age of Onset  . Hypertension Father   . Diabetes Father   . Lung cancer Father   . Kidney failure Father        Living  . Hypertension Mother 69       Deceased  . Diabetes Mother   . Mental illness Mother        Schizophrenia  . Stroke Mother   . Bipolar disorder Mother   . Depression Mother   . Anxiety disorder Mother   . Obesity Mother   . Diabetes Sister        Deceased  . Stroke Sister   . Mental illness Brother        Deceased  . Colon cancer Paternal Grandfather   . Diabetes Other        Maternal/Paternal side  . Hypertension Other        Maternal/Paternal side  . Multiple sclerosis Maternal Aunt   . Hypertension Sister   . Healthy Son        x2    Current Outpatient Medications  Medication Sig Dispense Refill  . aspirin EC 81 MG tablet Take 81 mg by mouth daily.    . Blood Glucose Monitoring Suppl (FREESTYLE LITE) DEVI Use as instructed 2 to 3 times daily to check blood sugar.  Dx E11.8 1 each 0  . buPROPion (WELLBUTRIN SR) 150 MG 12 hr tablet Take 1 tablet (150 mg total) by mouth daily. 30 tablet 0  . cyanocobalamin (,VITAMIN B-12,) 1000 MCG/ML injection Inject 1 mL (1,000 mcg total) into the muscle every 30 (thirty) days. 10 mL 0  . Dulaglutide (TRULICITY) 1.5 JK/9.3OI SOPN INJECT 1.5 MG (1 PEN) INTO THE SKIN ONCE A WEEK. 4 pen 0  . empagliflozin (JARDIANCE) 25 MG TABS tablet Take 25 mg by mouth daily. 30 tablet 5  . glucose blood (FREESTYLE LITE) test strip Use as instructed to check blood sugar 2-3 times a day.  Dx  E11.8 100 each 5  . Lancets (FREESTYLE) lancets Use to check blood sugar 2-3 times daily.  Dx E11.8 100 each 5  . losartan (COZAAR) 50 MG tablet Take 1 tablet (50 mg total) by mouth daily. [place]  tablet 0  . Multiple Vitamin (MULTIVITAMIN) tablet Take 1 tablet by mouth daily. Reported on 07/22/2015    . pantoprazole (PROTONIX) 40 MG tablet TAKE 1 TABLET BY MOUTH ONCE DAILY (MAKE APPT FOR FURTHER REFILLS) 30 tablet 5  . Phentermine-Topiramate (QSYMIA) 3.75-23 MG CP24 Take by mouth.    . Pitavastatin Calcium (LIVALO) 4 MG TABS Take 1 tablet (4 mg total) by mouth daily. 30 tablet 5  . Vitamin D, Ergocalciferol, (DRISDOL) 50000 units CAPS capsule Take 1 capsule (50,000 Units total) by mouth every 7 (seven) days. 4 capsule 0  . zolpidem (AMBIEN) 10 MG tablet Take 1/2 by mouth at night 15 tablet 2  . naproxen (NAPROSYN) 500 MG tablet Take 1 tablet (500 mg total) by mouth 2 (two) times daily with a meal. 30 tablet 1  . tiZANidine (ZANAFLEX) 4 MG tablet Take 1 tablet (4 mg total) by mouth every 8 (eight) hours as needed for muscle spasms. 30 tablet 0   No current facility-administered medications for this visit.    Allergies   Allergen Reactions  . Metformin And Related     Made pt really sick  . Codeine Rash   Social History   Socioeconomic History  . Marital status: Married    Spouse name: Not on file  . Number of children: 2  . Years of education: Not on file  . Highest education level: Not on file  Social Needs  . Financial resource strain: Not on file  . Food insecurity - worry: Not on file  . Food insecurity - inability: Not on file  . Transportation needs - medical: Not on file  . Transportation needs - non-medical: Not on file  Occupational History  . Occupation: Optician, dispensing: [employer]  Tobacco Use  . Smoking status: Former Smoker    Packs/day: 1.00    Types: Cigarettes    Start date: 06/11/2014  . Smokeless tobacco: Never Used  . Tobacco comment: pt doing the vapor cigerette   Substance and Sexual Activity  . Alcohol use: Yes    Alcohol/week: 0.0 oz    Comment: rarely  . Drug use: No  . Sexual activity: Not on file  Other Topics Concern  . 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 No acute distress. HENT: Normocephalic, atraumatic.  Extremities: No clubbing. No cyanosis. No edema.  Skin: Warm. Dry. No erythema. No rash.  Musculoskeletal: thoracic spine.   Normal active range of motion: yes.   Normal passive range of motion: yes Tenderness to palpation: no Deformity: no Ecchymosis: no Neurologic: Normal sensory function. No focal deficits noted. DTR's equal and symmetry in UE's. No clonus. Psychiatric: Normal mood. Age appropriate judgment and insight. Alert & oriented x 3.     Assessment:  Acute midline thoracic back pain - Plan: naproxen (NAPROSYN) 500 MG tablet, tiZANidine (ZANAFLEX) 4 MG tablet, ketorolac (TORADOL) injection 60 mg  Plan: Orders as above.  Likely a ligamentous issue as I cannot palpate/reproduce her pain.  Try Zanaflex to see if it is less sedating than Flexeril.  Stop taking copious amounts of Advil.  Tylenol.  Heat.  Keep moving. Follow-up if symptoms worsen or fail to improve, will consider physical therapy. The patient voiced understanding and agreement to the plan.   [place], DO 03/03/17  2:15 PM

## 2017-03-03 NOTE — Progress Notes (Signed)
Pre visit review using our clinic review tool, if applicable. No additional management support is needed unless otherwise documented below in the visit note. 

## 2017-03-03 NOTE — Patient Instructions (Addendum)
Heat (pad or rice pillow in microwave) over affected area, 10-15 minutes every 2-3 hours while awake.   OK to take Tylenol 1000 mg (2 extra strength tabs) or 975 mg (3 regular strength tabs) every 6 hours as needed.  Keep moving as tolerated.  Take Zanaflex 1-1.5 hours before bed. If it makes you very drowsy, you can try cutting it in half or avoid taking it during day.   Let us know if you need anything.

## 2017-03-16 ENCOUNTER — Telehealth: Payer: Self-pay | Admitting: Family

## 2017-03-16 ENCOUNTER — Ambulatory Visit (INDEPENDENT_AMBULATORY_CARE_PROVIDER_SITE_OTHER): Payer: 59 | Admitting: Family Medicine

## 2017-03-16 VITALS — BP 149/87 | HR 88 | Temp 97.9°F | Ht 64.0 in | Wt 215.0 lb

## 2017-03-16 DIAGNOSIS — Z9189 Other specified personal risk factors, not elsewhere classified: Secondary | ICD-10-CM

## 2017-03-16 DIAGNOSIS — E559 Vitamin D deficiency, unspecified: Secondary | ICD-10-CM | POA: Diagnosis not present

## 2017-03-16 DIAGNOSIS — E119 Type 2 diabetes mellitus without complications: Secondary | ICD-10-CM | POA: Diagnosis not present

## 2017-03-16 DIAGNOSIS — Z6837 Body mass index (BMI) 37.0-37.9, adult: Secondary | ICD-10-CM

## 2017-03-16 DIAGNOSIS — E538 Deficiency of other specified B group vitamins: Secondary | ICD-10-CM | POA: Diagnosis not present

## 2017-03-16 MED ORDER — VITAMIN B-12 1000 MCG PO TABS: 1000.0000 ug | ORAL_TABLET | Freq: Every day | ORAL | 0 refills | Status: DC

## 2017-03-16 MED ORDER — VITAMIN D (ERGOCALCIFEROL) 1.25 MG (50000 UNIT) PO CAPS: 50000.0000 [IU] | ORAL_CAPSULE | ORAL | 0 refills | Status: DC

## 2017-03-16 MED ORDER — DULAGLUTIDE 1.5 MG/0.5ML ~~LOC~~ SOAJ: SUBCUTANEOUS | 0 refills | Status: DC

## 2017-03-16 MED ORDER — PHENTERMINE-TOPIRAMATE ER 3.75-23 MG PO CP24: 3.7500 mg | ORAL_CAPSULE | Freq: Once | ORAL | 0 refills | Status: DC

## 2017-03-16 MED FILL — TRULICITY 1.5 MG/0.5 ML PEN: 1.5 | 28 days supply | Qty: 2 | Fill #0

## 2017-03-16 MED FILL — VIT D2 1.25 MG (50,000 UNIT: 1.25 MG | 28 days supply | Qty: 4 | Fill #0

## 2017-03-16 NOTE — Telephone Encounter (Signed)
Copied from [place] [id number]. Topic: Quick Communication - See Telephone Encounter >> Mar 16, 2017  9:21 AM Antonieta Iba C wrote:   CRM for notification. See Telephone encounter for: pt called in to be advised. She was seen in the office and advised that if not feeling better to call back in to make provider aware. Pt says that she is not feeling any better.   Please advise  CB: [phone]  03/16/17.

## 2017-03-16 NOTE — Telephone Encounter (Signed)
Left message for pt to return my call.

## 2017-03-16 NOTE — Progress Notes (Signed)
Office: [phone]  /  Fax: [phone]   HPI:   Chief Complaint: OBESITY Shelley Garcia is here to discuss her progress with her obesity treatment plan. She is on the Category 3 plan and follow our protein rich vegetarian plan and is following her eating plan approximately 80 % of the time. She states she is exercising 0 minutes 0 times per week. Shelley Garcia continues to do well with weight loss even with pain issues. Her blood pressure was initially elevated today but has improved after resting for a few minutes.  Her weight is 215 lb (97.5 kg) today and has had a weight loss of 2 pounds over a period of 2 weeks since her last visit. She has lost 10 lbs since starting treatment with [place].  Diabetes II Shelley Garcia has a diagnosis of diabetes type II. Shelley Garcia's A1c improved with diet and exercise. She states she is not checking BGs at home often and she denies nausea, vomiting, or any hypoglycemic episodes. Last A1c was 6.8 on 01/19/17. She has been working on intensive lifestyle modifications including diet, exercise, and weight loss to help control her blood glucose levels.  At risk for cardiovascular disease Shelley Garcia is at a higher than average risk for cardiovascular disease due to obesity and diabetes II. She currently denies any chest pain.  Vitamin D deficiency Shelley Garcia has a diagnosis of vitamin D deficiency. She is slowly improving on prescription Vit D, but not yet at goal. She denies nausea, vomiting or muscle weakness.  ALLERGIES: Allergies  Allergen Reactions  . Metformin And Related     Made pt really sick  . Codeine Rash    MEDICATIONS: Current Outpatient Medications on File Prior to Visit  Medication Sig Dispense Refill  . aspirin EC 81 MG tablet Take 81 mg by mouth daily.    . Blood Glucose Monitoring Suppl (FREESTYLE LITE) DEVI Use as instructed 2 to 3 times daily to check blood sugar.  Dx E11.8 1 each 0  . buPROPion (WELLBUTRIN SR) 150 MG 12 hr tablet Take 1 tablet (150 mg total) by mouth  daily. 30 tablet 0  . cyanocobalamin (,VITAMIN B-12,) 1000 MCG/ML injection Inject 1 mL (1,000 mcg total) into the muscle every 30 (thirty) days. 10 mL 0  . Dulaglutide (TRULICITY) 1.5 SN/0.5LZ SOPN INJECT 1.5 MG (1 PEN) INTO THE SKIN ONCE A WEEK. 4 pen 0  . empagliflozin (JARDIANCE) 25 MG TABS tablet Take 25 mg by mouth daily. 30 tablet 5  . glucose blood (FREESTYLE LITE) test strip Use as instructed to check blood sugar 2-3 times a day.  Dx  E11.8 100 each 5  . Lancets (FREESTYLE) lancets Use to check blood sugar 2-3 times daily.  Dx E11.8 100 each 5  . losartan (COZAAR) 50 MG tablet Take 1 tablet (50 mg total) by mouth daily. 30 tablet 0  . Multiple Vitamin (MULTIVITAMIN) tablet Take 1 tablet by mouth daily. Reported on 07/22/2015    . naproxen (NAPROSYN) 500 MG tablet Take 1 tablet (500 mg total) by mouth 2 (two) times daily with a meal. 30 tablet 1  . pantoprazole (PROTONIX) 40 MG tablet TAKE 1 TABLET BY MOUTH ONCE DAILY (MAKE APPT FOR FURTHER REFILLS) 30 tablet 5  . Phentermine-Topiramate (QSYMIA) 3.75-23 MG CP24 Take by mouth.    . Pitavastatin Calcium (LIVALO) 4 MG TABS Take 1 tablet (4 mg total) by mouth daily. 30 tablet 5  . tiZANidine (ZANAFLEX) 4 MG tablet Take 1 tablet (4 mg total) by mouth every 8 (  eight) hours as needed for muscle spasms. 30 tablet 0  . Vitamin D, Ergocalciferol, (DRISDOL) 50000 units CAPS capsule Take 1 capsule (50,000 Units total) by mouth every 7 (seven) days. 4 capsule 0  . zolpidem (AMBIEN) 10 MG tablet Take 1/2 by mouth at night 15 tablet 2   No current facility-administered medications on file prior to visit.     PAST MEDICAL HISTORY: Past Medical History:  Diagnosis Date  . Back pain   . Diabetes mellitus   . Gallbladder problem   . GERD (gastroesophageal reflux disease)   . History of chicken pox   . Hyperlipidemia   . Hypertension   . Joint pain   . Obesity   . Vitamin B12 deficiency   . Vitamin D deficiency     PAST SURGICAL HISTORY: Past  Surgical History:  Procedure Laterality Date  . CHOLECYSTECTOMY    . EXPLORATORY LAPAROTOMY    . KIDNEY STONE SURGERY    . WISDOM TOOTH EXTRACTION  07/31/2013    SOCIAL HISTORY: Social History   Tobacco Use  . Smoking status: Former Smoker    Packs/day: 1.00    Types: Cigarettes    Start date: 06/11/2014  . Smokeless tobacco: Never Used  . Tobacco comment: pt doing the vapor cigerette   Substance Use Topics  . Alcohol use: Yes    Alcohol/week: 0.0 oz    Comment: rarely  . Drug use: No    FAMILY HISTORY: Family History  Problem Relation Age of Onset  . Hypertension Father   . Diabetes Father   . Lung cancer Father   . Kidney failure Father        Living  . Hypertension Mother 8       Deceased  . Diabetes Mother   . Mental illness Mother        Schizophrenia  . Stroke Mother   . Bipolar disorder Mother   . Depression Mother   . Anxiety disorder Mother   . Obesity Mother   . Diabetes Sister        Deceased  . Stroke Sister   . Mental illness Brother        Deceased  . Colon cancer Paternal Grandfather   . Diabetes Other        Maternal/Paternal side  . Hypertension Other        Maternal/Paternal side  . Multiple sclerosis Maternal Aunt   . Hypertension Sister   . Healthy Son        x2    ROS: Review of Systems  Constitutional: Positive for weight loss.  Gastrointestinal: Negative for nausea and vomiting.  Musculoskeletal:       Negative muscle weakness   Endo/Heme/Allergies:       Negative hypoglycemia    PHYSICAL EXAM: Blood pressure (!) 149/87, pulse 88, temperature 97.9 F (36.6 C), temperature source Oral, height 5\' 4"  (1.626 m), weight 215 lb (97.5 kg), SpO2 98 %. Body mass index is 36.9 kg/m. Physical Exam  Constitutional: She is oriented to person, place, and time. She appears well-developed and well-nourished.  Cardiovascular: Normal rate.  Pulmonary/Chest: Effort normal.  Musculoskeletal: Normal range of motion.  Neurological: She is  oriented to person, place, and time.  Skin: Skin is warm and dry.  Psychiatric: She has a normal mood and affect. Her behavior is normal.  Vitals reviewed.   RECENT LABS AND TESTS: BMET    Component Value Date/Time   NA 141 03/02/2017 0919   K 4.8  03/02/2017 0919   CL 101 03/02/2017 0919   CO2 24 03/02/2017 0919   GLUCOSE 150 (H) 03/02/2017 0919   GLUCOSE 271 (H) 05/21/2016 1532   BUN 15 03/02/2017 0919   CREATININE 0.73 03/02/2017 0919   CREATININE 0.64 05/21/2016 1532   CALCIUM 9.1 03/02/2017 0919   GFRNONAA 97 03/02/2017 0919   GFRAA 112 03/02/2017 0919   Lab Results  Component Value Date   HGBA1C 6.8 01/19/2017   HGBA1C 9.1 (H) 10/12/2016   HGBA1C 7.4 07/27/2016   HGBA1C 9.3 (H) 05/21/2016   HGBA1C 8.6 07/22/2015   Lab Results  Component Value Date   INSULIN 25.3 (H) 03/02/2017   INSULIN 23.2 10/12/2016   CBC    Component Value Date/Time   WBC 7.0 10/12/2016 0959   WBC 12.6 (H) 05/27/2012 2345   RBC 4.85 10/12/2016 0959   RBC 4.79 05/27/2012 2345   HGB 15.1 10/12/2016 0959   HCT 45.4 10/12/2016 0959   PLT 319 10/12/2016 0959   MCV 94 10/12/2016 0959   MCH 31.1 10/12/2016 0959   MCH 31.7 05/27/2012 2345   MCHC 33.3 10/12/2016 0959   MCHC 34.9 05/27/2012 2345   RDW 13.8 10/12/2016 0959   LYMPHSABS 2.3 10/12/2016 0959   MONOABS 0.8 05/27/2012 2345   EOSABS 0.2 10/12/2016 0959   BASOSABS 0.1 10/12/2016 0959   Iron/TIBC/Ferritin/ %Sat No results found for: IRON, TIBC, FERRITIN, IRONPCTSAT Lipid Panel     Component Value Date/Time   CHOL 213 (H) 03/02/2017 0919   TRIG 182 (H) 03/02/2017 0919   HDL 47 03/02/2017 0919   CHOLHDL 5.4 (H) 05/21/2016 1532   VLDL 43 (H) 05/21/2016 1532   LDLCALC 130 (H) 03/02/2017 0919   LDLDIRECT 201.4 02/06/2014 1139   Hepatic Function Panel     Component Value Date/Time   PROT 6.9 03/02/2017 0919   ALBUMIN 4.2 03/02/2017 0919   AST 14 03/02/2017 0919   ALT 14 03/02/2017 0919   ALKPHOS 56 03/02/2017 0919    BILITOT 0.4 03/02/2017 0919      Component Value Date/Time   TSH 4.350 03/02/2017 0919   TSH 6.920 (H) 10/12/2016 0959   TSH 3.734 05/17/2012 0917    ASSESSMENT AND PLAN: Type 2 diabetes mellitus without complication, without long-term current use of insulin (HCC) - Plan: Dulaglutide (TRULICITY) 1.5 WU/9.8JX SOPN  Vitamin D deficiency - Plan: Vitamin D, Ergocalciferol, (DRISDOL) 50000 units CAPS capsule  B12 nutritional deficiency - Plan: vitamin B-12 (CYANOCOBALAMIN) 1000 MCG tablet  At risk for heart disease  Class 2 severe obesity with serious comorbidity and body mass index (BMI) of 37.0 to 37.9 in adult, unspecified obesity type ([place]) - Plan: Phentermine-Topiramate (QSYMIA) 3.75-23 MG CP24  PLAN:  Diabetes II Hisayo has been given extensive diabetes education by myself today including ideal fasting and post-prandial blood glucose readings, individual ideal Hgb A1c goals and hypoglycemia prevention. We discussed the importance of good blood sugar control to decrease the likelihood of diabetic complications such as nephropathy, neuropathy, limb loss, blindness, coronary artery disease, and death. We discussed the importance of intensive lifestyle modification including diet, exercise and weight loss as the first line treatment for diabetes. Shelley Garcia agrees to continue Trulicity 1.5 mg qw and we will refill for 1 month. Shelley Garcia agrees to follow up with our clinic in 4 weeks.   Cardiovascular risk counselling Brittaney was given extended (15 minutes) coronary artery disease prevention counseling today. She is 51 y.o. female and has risk factors for heart disease including obesity and  diabetes II. We discussed intensive lifestyle modifications today with an emphasis on specific weight loss instructions and strategies. Pt was also informed of the importance of increasing exercise and decreasing saturated fats to help prevent heart disease.  Vitamin D Deficiency Shelley Garcia was informed that low vitamin D  levels contributes to fatigue and are associated with obesity, breast, and colon cancer. Shelley Garcia agrees to continue taking prescription Vit D @50 ,000 IU every week #4 and we will refill for 1 month. She will follow up for routine testing of vitamin D, at least 2-3 times per year. She was informed of the risk of over-replacement of vitamin D and agrees to not increase her dose unless he discusses this with [place] first. Nancyjo agrees to follow up with our clinic in 4 weeks.  Obesity Shelley Garcia is currently in the action stage of change. As such, her goal is to continue with weight loss efforts She has agreed to keep a food journal with 1200 calories and 85 grams of protein daily Shelley Garcia has been instructed to work up to a goal of 150 minutes of combined cardio and strengthening exercise per week for weight loss and overall health benefits. We discussed the following Behavioral Modification Strategies today: increasing lean protein intake, decreasing simple carbohydrates  and holiday eating strategies  We discussed various medication options to help Shelley Garcia with her weight loss efforts and we both agreed to continue Qsymia 3.75/23 #30 with no refills, no print.  Shelley Garcia has agreed to follow up with our clinic in 4 weeks. She was informed of the importance of frequent follow up visits to maximize her success with intensive lifestyle modifications for her multiple health conditions.  I, Trixie Dredge, am acting as transcriptionist for Dennard Nip, MD  I have reviewed the above documentation for accuracy and completeness, and I agree with the above. -Dennard Nip, MD     Today's visit was # 8 out of 22.  Starting weight: 225 lbs Starting date: 10/12/16 Today's weight : 215 lbs  Today's date: 03/16/2017 Total lbs lost to date: 10 (Patients must lose 7 lbs in the first 6 months to continue with counseling)   ASK: We discussed the diagnosis of obesity with Shelley Garcia today and Shelley Garcia agreed to give [place]  permission to discuss obesity behavioral modification therapy today.  ASSESS: Shelley Garcia has the diagnosis of obesity and her BMI today is 24.89 Shelley Garcia is in the action stage of change   ADVISE: Shelley Garcia was educated on the multiple health risks of obesity as well as the benefit of weight loss to improve her health. She was advised of the need for long term treatment and the importance of lifestyle modifications.  AGREE: Multiple dietary modification options and treatment options were discussed and  Shelley Garcia agreed to keep a food journal with 1200 calories and 85 grams of protein daily We discussed the following Behavioral Modification Strategies today: increasing lean protein intake, decreasing simple carbohydrates  and holiday eating strategies

## 2017-03-16 NOTE — Telephone Encounter (Signed)
Sent to wrong pool. \

## 2017-03-16 NOTE — Telephone Encounter (Signed)
Dr Nani Ravens-- pt saw you on 03/03/17 and states she is not feeling better. Please advise?

## 2017-03-16 NOTE — Telephone Encounter (Signed)
Patient calling back [phone]

## 2017-03-17 ENCOUNTER — Encounter: Payer: Self-pay | Admitting: Family Medicine

## 2017-03-17 ENCOUNTER — Emergency Department (HOSPITAL_COMMUNITY): Payer: 59

## 2017-03-17 ENCOUNTER — Emergency Department (HOSPITAL_COMMUNITY)
Admission: EM | Admit: 2017-03-17 | Discharge: 2017-03-17 | Disposition: A | Discharge status: Home / Self Care | Payer: 59 | Attending: Emergency Medicine | Admitting: Emergency Medicine

## 2017-03-17 ENCOUNTER — Encounter (HOSPITAL_COMMUNITY): Payer: Self-pay | Admitting: Emergency Medicine

## 2017-03-17 ENCOUNTER — Other Ambulatory Visit: Payer: Self-pay | Admitting: Family Medicine

## 2017-03-17 ENCOUNTER — Other Ambulatory Visit: Payer: Self-pay

## 2017-03-17 DIAGNOSIS — E119 Type 2 diabetes mellitus without complications: Secondary | ICD-10-CM | POA: Diagnosis not present

## 2017-03-17 DIAGNOSIS — M5489 Other dorsalgia: Secondary | ICD-10-CM | POA: Diagnosis not present

## 2017-03-17 DIAGNOSIS — Z87891 Personal history of nicotine dependence: Secondary | ICD-10-CM | POA: Insufficient documentation

## 2017-03-17 DIAGNOSIS — Z7982 Long term (current) use of aspirin: Secondary | ICD-10-CM | POA: Insufficient documentation

## 2017-03-17 DIAGNOSIS — D1809 Hemangioma of other sites: Secondary | ICD-10-CM | POA: Insufficient documentation

## 2017-03-17 DIAGNOSIS — Z79899 Other long term (current) drug therapy: Secondary | ICD-10-CM | POA: Insufficient documentation

## 2017-03-17 DIAGNOSIS — M546 Pain in thoracic spine: Secondary | ICD-10-CM

## 2017-03-17 DIAGNOSIS — E785 Hyperlipidemia, unspecified: Secondary | ICD-10-CM | POA: Insufficient documentation

## 2017-03-17 DIAGNOSIS — I10 Essential (primary) hypertension: Secondary | ICD-10-CM | POA: Insufficient documentation

## 2017-03-17 DIAGNOSIS — D1800 Hemangioma unspecified site: Secondary | ICD-10-CM | POA: Diagnosis not present

## 2017-03-17 MED ORDER — METHYLPREDNISOLONE SODIUM SUCC 125 MG IJ SOLR
125.0000 mg | Freq: Once | INTRAMUSCULAR | Status: AC
  Administered 2017-03-17: 125 mg via INTRAMUSCULAR
  Filled 2017-03-17: qty 2

## 2017-03-17 MED ORDER — KETOROLAC TROMETHAMINE 60 MG/2ML IM SOLN
60.0000 mg | Freq: Once | INTRAMUSCULAR | Status: AC
  Administered 2017-03-17: 60 mg via INTRAMUSCULAR
  Filled 2017-03-17: qty 2

## 2017-03-17 MED ORDER — KETOROLAC TROMETHAMINE 10 MG PO TABS: 10.0000 mg | ORAL_TABLET | Freq: Four times a day (QID) | ORAL | 0 refills | Status: AC | PRN

## 2017-03-17 MED ORDER — MELOXICAM 15 MG PO TABS: 7.5000 mg | ORAL_TABLET | Freq: Every day | ORAL | 0 refills | Status: AC

## 2017-03-17 MED ORDER — ONDANSETRON 4 MG PO TBDP
4.0000 mg | ORAL_TABLET | Freq: Once | ORAL | Status: AC
  Administered 2017-03-17: 4 mg via ORAL
  Filled 2017-03-17: qty 1

## 2017-03-17 NOTE — ED Notes (Signed)
Pt states she had out patient MRI scheduled but is not until monday.

## 2017-03-17 NOTE — Progress Notes (Signed)
MRI ordered, pt notified via [place].

## 2017-03-17 NOTE — Discharge Instructions (Signed)
Please call [facility] to schedule a follow-up appointment.  A bone scan was recommended as well as repeat MRI with and without contrast in 3-6 months.   Take 1 tablet of Toradol every 6 hours as needed for pain control.  Do not take more than 4 tablets in a 24-hour period.  This medication should not be continued for more than 4 days.  After that time, you can take 1 tablet of meloxicam daily for pain control.  Please do not take other NSAIDs, such as ibuprofen, Motrin, naproxen, or Aleve while taking this medication.  Make sure to take this medication with food. Do not take Toradol and meloxicam at the same time.  If you develop any new or worsening symptoms, including weakness in the legs, loss of control of your bowel or bladder, paralysis, or worsening numbness, he is return to the emergency department for re-evaluation.

## 2017-03-17 NOTE — Telephone Encounter (Signed)
At this point I would consider physical therapy vs referral to sports med. Please place order if she is agreeable. TY.

## 2017-03-17 NOTE — ED Provider Notes (Signed)
51 year old female received at signout from [place] pending MRI results. Per her HPI:  " The history is provided by the patient. No language interpreter was used.  Back Pain   This is a new problem. Episode onset: 2 weeks. The problem occurs constantly. The problem has been gradually worsening. The pain is associated with no known injury. The pain is present in the thoracic spine. The quality of the pain is described as aching. The pain does not radiate. The pain is moderate. The pain is worse during the day. She has tried nothing for the symptoms. The treatment provided no relief.  Pt has had pain in her thoracic spine for 2 weeks.  Pt is scheduled for an MRi on Saturday.  Pt reports pain is severe today.  Pt reports she does not feel like she can tolerate any longer.  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  Pt has numbness in her right 4th and 5th toes.  Pt reports tingling sensation.  Pt is diabetic.  She is not sure if she may have some neuropathy."    Physical Exam  BP (!) 151/74 (BP Location: Right Arm)   Pulse 69   Temp 98.5 F (36.9 C) (Oral)   Resp 16   LMP  (LMP Unknown)   SpO2 97%   Physical Exam  I did not examine this patient.  ED Course/Procedures     Procedures  MDM  51 year old female presenting with atraumatic pain of the mid back.  X-ray with mild spondylosis in the lower thoracic spine.  Thoracic spine MR with a nonspecific 1.1 bone lesion in the L2 vertebral body only seen on the sagittal view.  Radiology recommended further evaluation with nonemergent bone scan and/or follow-up MRI of the lumbar spine with and without contrast in 3-6 months. A hemangioma unchanged since imaging in 2008 was also noted within the L1 vertebral body.  These findings have been discussed with the patient.  Pain controlled in the emergency department with Toradol.  The patient works in the [employer] and is concerned about being able to perform her job due to the amount of pain.   She has taken oral Toradol previously for nephrolithiasis.  Discussed the risk versus benefits of p.o. Toradol, and will send the patient home with a short 4-day course of the medication.  Will also provide her with a course of meloxicam after she finishes this treatment.  She declines prednisone at this time due to her h/o of DM.  Will provide the patient with referral to neurosurgery.  She is in agreement and understands the plan at this time.  Vital signs stable.  No acute distress.  The patient is safe for discharge for outpatient follow-up at this time.   Joanne Gavel, PA-C 03/17/17 2321    Nat Christen, MD 03/19/17 [id number]

## 2017-03-17 NOTE — ED Notes (Signed)
Patient transported to MRI 

## 2017-03-17 NOTE — ED Notes (Signed)
Pt verbalizes understanding of d/c instructions. Pt received prescriptions. Pt ambulatory at d/c with all belongings.  

## 2017-03-17 NOTE — Telephone Encounter (Signed)
Patient is waiting on the MRI first before getting referred. She states she is in such pain today, she would almost drive herself to the ER. Will check with [facility] to see where there MRI scheduling is at.

## 2017-03-17 NOTE — Telephone Encounter (Signed)
If pain is that severe, I think it would be beneficial to be evaluated again by someone. TY.

## 2017-03-17 NOTE — Telephone Encounter (Signed)
Called - left message

## 2017-03-17 NOTE — ED Provider Notes (Signed)
[facility] EMERGENCY DEPARTMENT Provider Note   CSN: [id number] Arrival date & time: 03/17/17  1443     History   Chief Complaint Chief Complaint  Patient presents with  . Back Pain    HPI Shelley Garcia is a 51 y.o. female.  The history is provided by the patient. No language interpreter was used.  Back Pain   This is a new problem. Episode onset: 2 weeks. The problem occurs constantly. The problem has been gradually worsening. The pain is associated with no known injury. The pain is present in the thoracic spine. The quality of the pain is described as aching. The pain does not radiate. The pain is moderate. The pain is worse during the day. She has tried nothing for the symptoms. The treatment provided no relief.  Pt has had pain in her thoracic spine for 2 weeks.  Pt is scheduled for an MRi on Saturday.  Pt reports pain is severe today.  Pt reports she does not feel like she can tolerate any longer.  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  Pt has numbness in her right 4th and 5th toes.  Pt reports tingling sensation.  Pt is diabetic.  She is not sure if she may have some neuropathy.    Past Medical History:  Diagnosis Date  . Back pain   . Diabetes mellitus   . Gallbladder problem   . GERD (gastroesophageal reflux disease)   . History of chicken pox   . Hyperlipidemia   . Hypertension   . Joint pain   . Obesity   . Vitamin B12 deficiency   . Vitamin D deficiency     Patient Active Problem List   Diagnosis Date Noted  . Elevated TSH 03/02/2017  . Other hyperlipidemia 03/02/2017  . Class 2 obesity with serious comorbidity and body mass index (BMI) of 37.0 to 37.9 in adult 11/23/2016  . Essential hypertension 11/23/2016  . Class 2 obesity without serious comorbidity with body mass index (BMI) of 37.0 to 37.9 in adult 11/09/2016  . Other fatigue 10/12/2016  . Dyspnea on exertion 10/12/2016  . Type 2 diabetes mellitus without  complication, without long-term current use of insulin ([place]) 10/12/2016  . Class 2 obesity with serious comorbidity and body mass index (BMI) of 38.0 to 38.9 in adult 10/12/2016  . Nonallopathic lesion of cervical region 11/18/2015  . Nonallopathic lesion of thoracic region 11/18/2015  . Nonallopathic lesion of lumbosacral region 11/18/2015  . Cervical spine pain 09/01/2015  . Hyperlipidemia 05/19/2015  . Trapezius strain 02/12/2015  . B12 deficiency 02/12/2015  . Vitamin D deficiency 02/12/2015  . Depression with anxiety 07/29/2013  . History of migraine headaches 05/17/2012  . Class 2 obesity with serious comorbidity and body mass index (BMI) of 39.0 to 39.9 in adult 05/17/2012  . Diabetes mellitus type 2 with complications, uncontrolled ([place]) 10/06/2010  . Esophageal reflux 10/06/2010    Past Surgical History:  Procedure Laterality Date  . CHOLECYSTECTOMY    . EXPLORATORY LAPAROTOMY    . KIDNEY STONE SURGERY    . WISDOM TOOTH EXTRACTION  07/31/2013    OB History    Gravida Para Term Preterm AB Living   2 2 2          SAB TAB Ectopic Multiple Live Births                   Home Medications  Prior to Admission medications   Medication Sig Start Date End Date Taking? Authorizing Provider  aspirin EC 81 MG tablet Take 81 mg by mouth daily.    [provider]  Blood Glucose Monitoring Suppl (FREESTYLE LITE) DEVI Use as instructed 2 to 3 times daily to check blood sugar.  Dx E11.8 06/01/16   Debbrah Alar, NP  buPROPion (WELLBUTRIN SR) 150 MG 12 hr tablet Take 1 tablet (150 mg total) by mouth daily. 12/08/16   Waldon Merl, PA-C  cyanocobalamin (,VITAMIN B-12,) 1000 MCG/ML injection Inject 1 mL (1,000 mcg total) into the muscle every 30 (thirty) days. 02/18/15   Brunetta Jeans, PA-C  Dulaglutide (TRULICITY) 1.5 KY/7.0WC SOPN INJECT 1.5 MG (1 PEN) INTO THE SKIN ONCE A WEEK. 03/16/17   Dennard Nip D, MD  empagliflozin (JARDIANCE) 25 MG TABS tablet Take 25 mg by  mouth daily. 07/08/16   Philemon Kingdom, MD  glucose blood (FREESTYLE LITE) test strip Use as instructed to check blood sugar 2-3 times a day.  Dx  E11.8 06/01/16   Debbrah Alar, NP  Lancets (FREESTYLE) lancets Use to check blood sugar 2-3 times daily.  Dx E11.8 06/01/16   Debbrah Alar, NP  losartan (COZAAR) 50 MG tablet Take 1 tablet (50 mg total) by mouth daily. 11/23/16   Dennard Nip D, MD  Multiple Vitamin (MULTIVITAMIN) tablet Take 1 tablet by mouth daily. Reported on 07/22/2015    [provider]  naproxen (NAPROSYN) 500 MG tablet Take 1 tablet (500 mg total) by mouth 2 (two) times daily with a meal. 03/03/17   Wendling, Crosby Oyster, DO  pantoprazole (PROTONIX) 40 MG tablet TAKE 1 TABLET BY MOUTH ONCE DAILY (MAKE APPT FOR FURTHER REFILLS) 08/25/16   Debbrah Alar, NP  Phentermine-Topiramate (QSYMIA) 3.75-23 MG CP24 Take 3.75 mg by mouth once for 1 dose. 03/16/17 03/16/17  Dennard Nip D, MD  Pitavastatin Calcium (LIVALO) 4 MG TABS Take 1 tablet (4 mg total) by mouth daily. 08/25/16   Debbrah Alar, NP  tiZANidine (ZANAFLEX) 4 MG tablet Take 1 tablet (4 mg total) by mouth every 8 (eight) hours as needed for muscle spasms. 03/03/17   Shelda Pal, DO  vitamin B-12 (CYANOCOBALAMIN) 1000 MCG tablet Take 1 tablet (1,000 mcg total) by mouth daily. 03/16/17   Dennard Nip D, MD  Vitamin D, Ergocalciferol, (DRISDOL) 50000 units CAPS capsule Take 1 capsule (50,000 Units total) by mouth every 7 (seven) days. 03/16/17   Starlyn Skeans, MD  zolpidem Lorrin Mais) 10 MG tablet Take 1/2 by mouth at night 05/22/15   Brunetta Jeans, PA-C    Family History Family History  Problem Relation Age of Onset  . Hypertension Father   . Diabetes Father   . Lung cancer Father   . Kidney failure Father        Living  . Hypertension Mother 62       Deceased  . Diabetes Mother   . Mental illness Mother        Schizophrenia  . Stroke Mother   . Bipolar disorder Mother    . Depression Mother   . Anxiety disorder Mother   . Obesity Mother   . Diabetes Sister        Deceased  . Stroke Sister   . Mental illness Brother        Deceased  . Colon cancer Paternal Grandfather   . Diabetes Other        Maternal/Paternal side  . Hypertension Other  Maternal/Paternal side  . Multiple sclerosis Maternal Aunt   . Hypertension Sister   . Healthy Son        x2    Social History Social History   Tobacco Use  . Smoking status: Former Smoker    Packs/day: 1.00    Types: Cigarettes    Start date: 06/11/2014  . Smokeless tobacco: Never Used  . Tobacco comment: pt doing the vapor cigerette   Substance Use Topics  . Alcohol use: Yes    Alcohol/week: 0.0 oz    Comment: rarely  . Drug use: No     Allergies   Metformin and related and Codeine   Review of Systems Review of Systems  Musculoskeletal: Positive for back pain.  All other systems reviewed and are negative.    Physical Exam Updated Vital Signs BP (!) 155/100 (BP Location: Right Arm)   Pulse 82   Temp 98.5 F (36.9 C) (Oral)   Resp 18   LMP  (LMP Unknown)   SpO2 97%   Physical Exam  Constitutional: She is oriented to person, place, and time. She appears well-developed and well-nourished.  HENT:  Head: Normocephalic.  Eyes: EOM are normal.  Neck: Normal range of motion.  Cardiovascular: Normal rate and regular rhythm.  Pulmonary/Chest: Effort normal.  Abdominal: Soft. She exhibits no distension.  Musculoskeletal: Normal range of motion.  Tender upper thoracic spine,  Toes normal appearance.  From  nv and ns intact  Neurological: She is alert and oriented to person, place, and time.  Skin: Skin is warm.  Psychiatric: She has a normal mood and affect.  Nursing note and vitals reviewed.    ED Treatments / Results  Labs (all labs ordered are listed, but only abnormal results are displayed) Labs Reviewed - No data to display  EKG  EKG Interpretation None        Radiology Dg Thoracic Spine 2 View  Result Date: 03/17/2017 CLINICAL DATA:  Mid back pain for 2 weeks.  Initial encounter. EXAM: THORACIC SPINE 2 VIEWS COMPARISON:  12/05/2015 chest radiograph and prior studies. FINDINGS: Minimal apex right thoracic spine curvature again noted. There is no evidence of acute fracture or subluxation. No focal bony lesions are present. Very mild spondylosis in the lower thoracic spine identified. IMPRESSION: No acute abnormality. Mild spondylosis in the lower thoracic spine. Electronically Signed   By: Margarette Canada M.D.   On: 03/17/2017 17:46    Procedures Procedures (including critical care time)  Medications Ordered in ED Medications  ketorolac (TORADOL) injection 60 mg (60 mg Intramuscular Given 03/17/17 1930)  ondansetron (ZOFRAN-ODT) disintegrating tablet 4 mg (4 mg Oral Given 03/17/17 1930)  methylPREDNISolone sodium succinate (SOLU-MEDROL) 125 mg/2 mL injection 125 mg (125 mg Intramuscular Given 03/17/17 1928)     Initial Impression / Assessment and Plan / ED Course  I have reviewed the triage vital signs and the nursing notes.  Pertinent labs & imaging results that were available during my care of the patient were reviewed by me and considered in my medical decision making (see chart for details).     Pt given solumedrol, torodol and zofran.   Pt wants to have MRi, due to level of pain.  MRi ordered.  Final Clinical Impressions(s) / ED Diagnoses   Final diagnoses:  None    ED Discharge Orders    None     Pt's care turned over to [facility]   Sidney Ace 03/17/17 2124    Ellender Hose,  Lysbeth Galas, MD 03/17/17 [phone]

## 2017-03-17 NOTE — ED Triage Notes (Signed)
Pt has been having mid back pain since 12/06 [facility] Pt states she was placed on anti inflammatories and zanflex with no relief. Pt states her right little toe sometimes feels tingling. Pt has had no loss of bowel or bladder. Pt is ambulatory.

## 2017-03-17 NOTE — Telephone Encounter (Signed)
She called back and did inform her of PCP instructions. She is going now to the ER due to pain. Her MRI is scheduled for this coming Saturday, but she does not want to wait until then

## 2017-03-18 ENCOUNTER — Telehealth: Payer: Self-pay | Admitting: *Deleted

## 2017-03-18 DIAGNOSIS — M899 Disorder of bone, unspecified: Secondary | ICD-10-CM

## 2017-03-18 NOTE — Telephone Encounter (Signed)
Radiologist recommended non-urgent bone scan or repeat MRI lumbar spine in 3 months. I will place an order for bone scan. She will need to complete urine HCG prior to bone scan.

## 2017-03-18 NOTE — Telephone Encounter (Signed)
Pt was seen in ER on 03/17/17 and had imaging obtained.  Copied from [place] [id number]. Topic: Referral - Question >> Mar 17, 2017  1:32 PM Synthia Innocent wrote: Reason for CRM: [facility] called to scheduled, only does on Saturday. Wants done asap, can this be done anywhere else.

## 2017-03-18 NOTE — Telephone Encounter (Signed)
Notified pt. She requests scan be done before the end of the year. Spoke with [facility] at [place] [phone]). They have scheduled pt for injection on 03/21/17 at 10am, arrive by 9:45am. Will need to return to radiology by 1pm for the scan. Pt will need to have urine HCG and she refused test initially. Advised her radiology may not perform scan as that is a radiology protocol prior to the scan. Pt is going to call radiology and find out if they can do test prior to injection at their lab and will call us back if we need to place orders.

## 2017-03-21 ENCOUNTER — Other Ambulatory Visit: Payer: 59

## 2017-03-21 ENCOUNTER — Ambulatory Visit (HOSPITAL_COMMUNITY)
Admission: RE | Admit: 2017-03-21 | Discharge: 2017-03-21 | Disposition: A | Discharge status: Home / Self Care | Payer: 59 | Source: Ambulatory Visit | Attending: Family | Admitting: Family

## 2017-03-21 DIAGNOSIS — M899 Disorder of bone, unspecified: Secondary | ICD-10-CM | POA: Insufficient documentation

## 2017-03-21 DIAGNOSIS — M549 Dorsalgia, unspecified: Secondary | ICD-10-CM | POA: Diagnosis not present

## 2017-03-21 DIAGNOSIS — M150 Primary generalized (osteo)arthritis: Secondary | ICD-10-CM | POA: Insufficient documentation

## 2017-03-21 MED ORDER — TECHNETIUM TC 99M MEDRONATE IV KIT
22.0000 | PACK | Freq: Once | INTRAVENOUS | Status: AC | PRN
  Administered 2017-03-21: 22 via INTRAVENOUS

## 2017-03-23 ENCOUNTER — Telehealth: Payer: Self-pay | Admitting: Family

## 2017-03-23 DIAGNOSIS — M546 Pain in thoracic spine: Secondary | ICD-10-CM

## 2017-03-23 NOTE — Telephone Encounter (Signed)
See mychart.  

## 2017-03-24 DIAGNOSIS — M546 Pain in thoracic spine: Secondary | ICD-10-CM | POA: Diagnosis not present

## 2017-03-24 DIAGNOSIS — M545 Low back pain: Secondary | ICD-10-CM | POA: Diagnosis not present

## 2017-03-24 MED FILL — INDOMETHACIN 25 MG CAPSULE: 25 | 15 days supply | Qty: 60 | Fill #0

## 2017-03-24 NOTE — Addendum Note (Signed)
Addended by: Debbrah Alar on: 03/24/2017 09:45 AM   Modules accepted: Orders

## 2017-03-26 DIAGNOSIS — M545 Low back pain: Secondary | ICD-10-CM | POA: Diagnosis not present

## 2017-03-28 DIAGNOSIS — M455 Ankylosing spondylitis of thoracolumbar region: Secondary | ICD-10-CM | POA: Diagnosis not present

## 2017-04-01 ENCOUNTER — Encounter: Payer: Self-pay | Admitting: Family

## 2017-04-01 DIAGNOSIS — M549 Dorsalgia, unspecified: Secondary | ICD-10-CM

## 2017-04-07 ENCOUNTER — Encounter (INDEPENDENT_AMBULATORY_CARE_PROVIDER_SITE_OTHER): Payer: Self-pay

## 2017-04-07 MED FILL — predniSONE 10 MG TABS: 10 | 12 days supply | Qty: 21 | Fill #0

## 2017-04-13 ENCOUNTER — Ambulatory Visit (INDEPENDENT_AMBULATORY_CARE_PROVIDER_SITE_OTHER): Payer: 59 | Admitting: Family Medicine

## 2017-04-15 ENCOUNTER — Encounter: Payer: Self-pay | Admitting: Family

## 2017-04-15 DIAGNOSIS — E119 Type 2 diabetes mellitus without complications: Secondary | ICD-10-CM

## 2017-04-15 MED ORDER — DULAGLUTIDE 1.5 MG/0.5ML ~~LOC~~ SOAJ
SUBCUTANEOUS | 5 refills | Status: DC
Start: 2017-04-15 — End: 2017-09-14

## 2017-04-15 MED FILL — TRULICITY 1.5 MG/0.5 ML PEN: 1.5 | 28 days supply | Qty: 2 | Fill #0

## 2017-05-17 MED FILL — JARDIANCE 25 MG TABLET: 25 | 30 days supply | Qty: 30 | Fill #2

## 2017-05-17 MED FILL — PANTOPRAZOLE SOD DR 40 MG T: 40 | 30 days supply | Qty: 30 | Fill #2

## 2017-05-17 MED FILL — LIVALO 4 MG TABLET: 4 | 30 days supply | Qty: 30 | Fill #2

## 2017-05-17 MED FILL — TRULICITY 1.5 MG/0.5 ML PEN: 1.5 | 28 days supply | Qty: 2 | Fill #1

## 2017-06-15 MED FILL — TRULICITY 1.5 MG/0.5 ML PEN: 1.5 | 28 days supply | Qty: 2 | Fill #2

## 2017-07-08 ENCOUNTER — Other Ambulatory Visit: Payer: Self-pay | Admitting: Internal Medicine

## 2017-07-08 MED FILL — PANTOPRAZOLE SOD DR 40 MG T: 40 | 30 days supply | Qty: 30 | Fill #3

## 2017-07-08 MED FILL — TRULICITY 1.5 MG/0.5 ML PEN: 1.5 | 28 days supply | Qty: 2 | Fill #3

## 2017-08-12 MED FILL — TRULICITY 1.5 MG/0.5 ML PEN: 1.5 | 28 days supply | Qty: 2 | Fill #4

## 2017-08-19 LAB — HM DIABETES EYE EXAM

## 2017-08-29 ENCOUNTER — Encounter: Payer: Self-pay | Admitting: Family

## 2017-09-14 ENCOUNTER — Ambulatory Visit (INDEPENDENT_AMBULATORY_CARE_PROVIDER_SITE_OTHER): Payer: No Typology Code available for payment source | Admitting: Family

## 2017-09-14 ENCOUNTER — Encounter: Payer: Self-pay | Admitting: Family

## 2017-09-14 VITALS — BP 122/67 | HR 62 | Temp 98.2°F | Resp 16 | Ht 64.0 in | Wt 190.6 lb

## 2017-09-14 DIAGNOSIS — K219 Gastro-esophageal reflux disease without esophagitis: Secondary | ICD-10-CM | POA: Diagnosis not present

## 2017-09-14 DIAGNOSIS — Z23 Encounter for immunization: Secondary | ICD-10-CM

## 2017-09-14 DIAGNOSIS — Z0000 Encounter for general adult medical examination without abnormal findings: Secondary | ICD-10-CM

## 2017-09-14 DIAGNOSIS — E785 Hyperlipidemia, unspecified: Secondary | ICD-10-CM

## 2017-09-14 DIAGNOSIS — R9431 Abnormal electrocardiogram [ECG] [EKG]: Secondary | ICD-10-CM

## 2017-09-14 DIAGNOSIS — E119 Type 2 diabetes mellitus without complications: Secondary | ICD-10-CM

## 2017-09-14 MED ORDER — TRAZODONE HCL 50 MG PO TABS: 25.0000 mg | ORAL_TABLET | Freq: Every evening | ORAL | 3 refills | Status: DC | PRN

## 2017-09-14 MED ORDER — DULAGLUTIDE 1.5 MG/0.5ML ~~LOC~~ SOAJ: SUBCUTANEOUS | 5 refills | Status: DC

## 2017-09-14 MED ORDER — PANTOPRAZOLE SODIUM 40 MG PO TBEC: DELAYED_RELEASE_TABLET | ORAL | 5 refills | Status: DC

## 2017-09-14 MED ORDER — GLUCOSE BLOOD VI STRP: ORAL_STRIP | 5 refills | Status: AC

## 2017-09-14 MED ORDER — PITAVASTATIN CALCIUM 4 MG PO TABS: 1.0000 | ORAL_TABLET | Freq: Every day | ORAL | 5 refills | Status: DC

## 2017-09-14 NOTE — Addendum Note (Signed)
Addended by: Kelle Darting A on: 09/14/2017 03:22 PM   Modules accepted: Orders

## 2017-09-14 NOTE — Addendum Note (Signed)
Addended by: Harl Bowie on: 09/14/2017 05:27 PM   Modules accepted: Orders

## 2017-09-14 NOTE — Progress Notes (Signed)
Subjective:    Patient ID: Shelley Garcia, female    DOB: 04/13/1965, 52 y.o.   MRN: [id number]  HPI  Patient is a 52 year old female who presents today for annual physical. I have not seen her since 2/18.   Immunizations:  tdap 11/16 Diet:  Exercise: not exercising  Doing medfast and has lost 28 pounds.  Wt Readings from Last 3 Encounters:  09/14/17 190 lb 9.6 oz (86.5 kg)  03/16/17 215 lb (97.5 kg)  03/03/17 222 lb 6 oz (100.9 kg)  Colonoscopy: declines Pap Smear: declines Mammogram: due Vision: 5/19 Dental: 2018  DM2- reports improved sugars with her current diet.   Lab Results  Component Value Date   HGBA1C 6.8 01/19/2017   GERD- stable on PPI  C/o insomnia. Alternates days/night sleep schedule with her work schedule.  Hyperlipidemia- continues livalo.   Review of Systems  Respiratory: Negative for shortness of breath.   Cardiovascular: Negative for chest pain.   See HPI  Past Medical History:  Diagnosis Date  . Back pain   . Diabetes mellitus   . Gallbladder problem   . GERD (gastroesophageal reflux disease)   . History of chicken pox   . Hyperlipidemia   . Hypertension   . Joint pain   . Obesity   . Vitamin B12 deficiency   . Vitamin D deficiency      Social History   Socioeconomic History  . Marital status: Married    Spouse name: Not on file  . Number of children: 2  . Years of education: Not on file  . Highest education level: Not on file  Occupational History  . Occupation: Optician, dispensing: [employer]  . Financial resource strain: Not on file  . Food insecurity:    Worry: Not on file    Inability: Not on file  . Transportation needs:    Medical: Not on file    Non-medical: Not on file  Tobacco Use  . Smoking status: Former Smoker    Packs/day: 1.00    Types: Cigarettes    Start date: 06/11/2014  . Smokeless tobacco: Never Used  . Tobacco comment: pt doing the vapor cigerette   Substance and Sexual Activity    . Alcohol use: Yes    Alcohol/week: 0.0 oz    Comment: rarely  . Drug use: No  . Sexual activity: Not on file  Lifestyle  . Physical activity:    Days per week: Not on file    Minutes per session: Not on file  . Stress: Not on file  Relationships  . Social connections:    Talks on phone: Not on file    Gets together: Not on file    Attends religious service: Not on file    Active member of club or organization: Not on file    Attends meetings of clubs or organizations: Not on file    Relationship status: Not on file  . Intimate partner violence:    Fear of current or ex partner: Not on file    Emotionally abused: Not on file    Physically abused: Not on file    Forced sexual activity: Not on file  Other Topics Concern  . 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  CHOLECYSTECTOMY    . EXPLORATORY LAPAROTOMY    . KIDNEY STONE SURGERY    . WISDOM TOOTH EXTRACTION  07/31/2013    Family History  Problem Relation Age of Onset  . Hypertension Father   . Diabetes Father   . Lung cancer Father   . Kidney failure Father        Living  . Hypertension Mother 80       Deceased  . Diabetes Mother   . Mental illness Mother        Schizophrenia  . Stroke Mother   . Bipolar disorder Mother   . Depression Mother   . Anxiety disorder Mother   . Obesity Mother   . Diabetes Sister        Deceased  . Stroke Sister   . Mental illness Brother        Deceased  . Colon cancer Paternal Grandfather   . Diabetes Other        Maternal/Paternal side  . Hypertension Other        Maternal/Paternal side  . Multiple sclerosis Maternal Aunt   . Hypertension Sister   . Healthy Son        x2    Allergies  Allergen Reactions  . Metformin And Related     Made pt really sick  . Codeine Rash    Current Outpatient Medications on File  Prior to Visit  Medication Sig Dispense Refill  . Blood Glucose Monitoring Suppl (FREESTYLE LITE) DEVI Use as instructed 2 to 3 times daily to check blood sugar.  Dx E11.8 1 each 0  . Lancets (FREESTYLE) lancets Use to check blood sugar 2-3 times daily.  Dx E11.8 100 each 5   No current facility-administered medications on file prior to visit.     BP 122/67 (BP Location: Right Arm, Cuff Size: Large)   Pulse 62   Temp 98.2 F (36.8 C) (Oral)   Resp 16   Ht 5\' 4"  (1.626 m)   Wt 190 lb 9.6 oz (86.5 kg)   LMP 01/27/2017   SpO2 99%   BMI 32.72 kg/m       Objective:   Physical Exam  Physical Exam  Constitutional: She is oriented to person, place, and time. She appears well-developed and well-nourished. No distress.  HENT:  Head: Normocephalic and atraumatic.  Right Ear: Tympanic membrane and ear canal normal.  Left Ear: Tympanic membrane and ear canal normal.  Mouth/Throat: Oropharynx is clear and moist.  Eyes: Pupils are equal, round, and reactive to light. No scleral icterus.  Neck: Normal range of motion. No thyromegaly present.  Cardiovascular: Normal rate and regular rhythm.   No murmur heard. Pulmonary/Chest: Effort normal and breath sounds normal. No respiratory distress. He has no wheezes. She has no rales. She exhibits no tenderness.  Abdominal: Soft. Bowel sounds are normal. She exhibits no distension and no mass. There is no tenderness. There is no rebound and no guarding.  Musculoskeletal: She exhibits no edema.  Lymphadenopathy:    She has no cervical adenopathy.  Neurological: She is alert and oriented to person, place, and time. She has normal patellar reflexes. She exhibits normal muscle tone. Coordination normal.  Skin: Skin is warm and dry.  Psychiatric: She has a normal mood and affect. Her behavior is normal. Judgment and thought content normal.  Breasts: Examined lying Right: Without masses, retractions, discharge or axillary adenopathy.  Left: Without  masses, retractions, discharge or axillary adenopathy.  Pelvic: deferred  Assessment & Plan:   Preventative care- encouraged her continued work on weight loss. Due for pneumovax 23.  Declines colo/pap/cologuard at this time. Counseled on the importance.   Abnormal EKG- EKG tracing is personally reviewed.  EKG notes NSR. ? Old anteroseptal infact. Asymptomatic. Will refer to cardiology for consult given her hx of Hyperlipidemia/dm2.  Hyperlipidemia- tolerating statin, obtain follow up lipid panel.  GERD- stable on PPI, continue same.  Insomnia- trial of trazadone.   Hot flashes- offered trial of HS gabapentin, she declines.       Assessment & Plan:

## 2017-09-14 NOTE — Patient Instructions (Addendum)
Keep up the great work with exercise. Complete lab work prior to leaving. Begin trazadone 1/2-1 tab at bedtime for sleep.  Schedule mammogram on the first floor. Let me know if you change your mind about gyn referral for pap smear or colonoscopy/cologuard.

## 2017-09-15 ENCOUNTER — Encounter: Payer: Self-pay | Admitting: Family

## 2017-09-15 LAB — URINALYSIS, ROUTINE W REFLEX MICROSCOPIC
Bilirubin, UA: NEGATIVE
GLUCOSE, UA: NEGATIVE
Leukocytes, UA: NEGATIVE
Nitrite, UA: NEGATIVE
PH UA: 6.5 (ref 5.0–7.5)
Protein, UA: NEGATIVE
RBC, UA: NEGATIVE
Specific Gravity, UA: 1.02 (ref 1.005–1.030)
UUROB: 0.2 mg/dL (ref 0.2–1.0)

## 2017-09-15 LAB — CBC WITH DIFFERENTIAL/PLATELET
BASOS ABS: 0 10*3/uL (ref 0.0–0.2)
Basos: 1 %
EOS (ABSOLUTE): 0.2 10*3/uL (ref 0.0–0.4)
EOS: 4 %
HEMATOCRIT: 47.2 % — AB (ref 34.0–46.6)
HEMOGLOBIN: 15.8 g/dL (ref 11.1–15.9)
Immature Grans (Abs): 0 10*3/uL (ref 0.0–0.1)
Immature Granulocytes: 0 %
Lymphocytes Absolute: 1.6 10*3/uL (ref 0.7–3.1)
Lymphs: 31 %
MCH: 30.9 pg (ref 26.6–33.0)
MCHC: 33.5 g/dL (ref 31.5–35.7)
MCV: 92 fL (ref 79–97)
MONOCYTES: 8 %
Monocytes Absolute: 0.4 10*3/uL (ref 0.1–0.9)
NEUTROS ABS: 2.9 10*3/uL (ref 1.4–7.0)
Neutrophils: 56 %
Platelets: 300 10*3/uL (ref 150–450)
RBC: 5.11 x10E6/uL (ref 3.77–5.28)
RDW: 14.6 % (ref 12.3–15.4)
WBC: 5.1 10*3/uL (ref 3.4–10.8)

## 2017-09-15 LAB — BASIC METABOLIC PANEL
BUN / CREAT RATIO: 22 (ref 9–23)
BUN: 14 mg/dL (ref 6–24)
CO2: 23 mmol/L (ref 20–29)
CREATININE: 0.63 mg/dL (ref 0.57–1.00)
Calcium: 9.2 mg/dL (ref 8.7–10.2)
Chloride: 102 mmol/L (ref 96–106)
GFR, EST AFRICAN AMERICAN: 121 mL/min/{1.73_m2} (ref >59)
GFR, EST NON AFRICAN AMERICAN: 105 mL/min/{1.73_m2} (ref >59)
Glucose: 90 mg/dL (ref 65–99)
Potassium: 4.4 mmol/L (ref 3.5–5.2)
SODIUM: 141 mmol/L (ref 134–144)

## 2017-09-15 LAB — LIPID PANEL
CHOLESTEROL TOTAL: 193 mg/dL (ref 100–199)
Chol/HDL Ratio: 4.4 ratio (ref 0.0–4.4)
HDL: 44 mg/dL (ref >39)
LDL Calculated: 127 mg/dL — ABNORMAL HIGH (ref 0–99)
Triglycerides: 110 mg/dL (ref 0–149)
VLDL CHOLESTEROL CAL: 22 mg/dL (ref 5–40)

## 2017-09-15 LAB — HEPATIC FUNCTION PANEL
ALBUMIN: 4.1 g/dL (ref 3.5–5.5)
ALT: 28 IU/L (ref 0–32)
AST: 19 IU/L (ref 0–40)
Alkaline Phosphatase: 52 IU/L (ref 39–117)
Bilirubin Total: 0.4 mg/dL (ref 0.0–1.2)
Bilirubin, Direct: 0.14 mg/dL (ref 0.00–0.40)
Total Protein: 6.9 g/dL (ref 6.0–8.5)

## 2017-09-15 LAB — HEMOGLOBIN A1C
ESTIMATED AVERAGE GLUCOSE: 123 mg/dL
Hgb A1c MFr Bld: 5.9 % — ABNORMAL HIGH (ref 4.8–5.6)

## 2017-09-15 LAB — TSH: TSH: 4.03 u[IU]/mL (ref 0.450–4.500)

## 2017-09-16 ENCOUNTER — Telehealth: Payer: Self-pay | Admitting: *Deleted

## 2017-09-16 NOTE — Telephone Encounter (Signed)
Received Lab Report results from [facility]; forwarded to provider/SLS 06/21

## 2017-10-13 DIAGNOSIS — R9431 Abnormal electrocardiogram [ECG] [EKG]: Secondary | ICD-10-CM | POA: Insufficient documentation

## 2017-10-13 NOTE — Progress Notes (Signed)
Cardiology Office Note:    Date:  10/14/2017   ID:  Shelley Garcia, DOB 05/17/1965, MRN [id number]  PCP:  Debbrah Alar, NP  Cardiologist:  Shirlee More, MD   Referring MD: Debbrah Alar, NP  ASSESSMENT:    1. Abnormal electrocardiogram (ECG) (EKG)   2. Essential hypertension   3. Diabetes mellitus type 2 with complications, uncontrolled ([place])   4. Hyperlipidemia, unspecified hyperlipidemia type   5. Dyspnea on exertion    PLAN:    In order of problems listed above:  1. Her EKG is abnormal but unfortunately an overlap pattern seen both normals and abnormals.  Clinical question is whether she has had a previous infarction echocardiography can resolve it if normal at this time I would not perform a more extensive ischemia evaluation.  If abnormal she will require either cardiac CTA or coronary angiography. 2. Stable blood pressure well controlled  3. stable A1c is optimal  4. stable continue her statin continue Trulicity   Next appointment as needed if her echocardiogram is normal if abnormal I will bring her back to my office   Medication Adjustments/Labs and Tests Ordered: Current medicines are reviewed at length with the patient today.  Concerns regarding medicines are outlined above.  Orders Placed This Encounter  Procedures  . ECHOCARDIOGRAM COMPLETE   No orders of the defined types were placed in this encounter.    Chief Complaint  Patient presents with  . Follow-up    abnormal EKG    History of Present Illness:    Shelley Garcia is a 52 y.o. female who is being seen today for the evaluation of an abnormal EKG at the request of Debbrah Alar, NP.  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  She has a history of T-spine disc disease and mid thoracic pain that radiates to the front of the right.  More than 5 years ago she was seen in the emergency room they are concerned she was having a heart attack check serial cardiac enzymes  but never had a follow-up ischemia evaluation.  Recent EKG showed a pattern consistent with previous anterior septal MI.  She is not having exertional angina dyspnea recent palpitation or syncope but years ago has had rapid heart rhythms never requiring treatment.  She has no history of congenital or rheumatic heart disease.  She is at increased cardiovascular risk with type 2 diabetes hypertension dyslipidemia and for further evaluation will undergo  Echocardiogram.  If normal no pattern of infarction I do not think she requires an ischemia evaluation at this time.  She has done an exceptional job dealing with body weight controlling diabetes and is a very compliant patient Past Medical History:  Diagnosis Date  . B12 deficiency 02/12/2015  . Back pain   . Cervical spine pain 09/01/2015  . Depression with anxiety 07/29/2013  . Diabetes mellitus   . Diabetes mellitus type 2 with complications, uncontrolled ([place]) 10/06/2010   Diagnosed with Type II DM in ~ 2008; pt does not tolerate Metformin (GI upset). Has been on Lantus since 2011. Vision assessment every 2 years- Optometry.   Marland Kitchen Dyspnea on exertion 10/12/2016  . Elevated TSH 03/02/2017  . Esophageal reflux 10/06/2010  . Essential hypertension 11/23/2016  . Gallbladder problem   . GERD (gastroesophageal reflux disease)   . History of chicken pox   . History of migraine headaches 05/17/2012  . Hyperlipidemia   . Hypertension   . Joint pain   . Nonallopathic  lesion of cervical region 11/18/2015  . Nonallopathic lesion of lumbosacral region 11/18/2015  . Nonallopathic lesion of thoracic region 11/18/2015  . Obesity   . Other fatigue 10/12/2016  . Other hyperlipidemia 03/02/2017  . Trapezius strain 02/12/2015  . Type 2 diabetes mellitus without complication, without long-term current use of insulin ([place]) 10/12/2016  . Vitamin B12 deficiency   . Vitamin D deficiency     Past Surgical History:  Procedure Laterality Date  . CHOLECYSTECTOMY    .  EXPLORATORY LAPAROTOMY    . KIDNEY STONE SURGERY    . WISDOM TOOTH EXTRACTION  07/31/2013    Current Medications: Current Meds  Medication Sig  . Blood Glucose Monitoring Suppl (FREESTYLE LITE) DEVI Use as instructed 2 to 3 times daily to check blood sugar.  Dx E11.8  . Dulaglutide (TRULICITY) 1.5 BO/1.7PZ SOPN INJECT 1.5 MG (1 PEN) INTO THE SKIN ONCE A WEEK.  Marland Kitchen glucose blood (FREESTYLE LITE) test strip Use as instructed to check blood sugar 2-3 times a day.  Dx  E11.8  . Lancets (FREESTYLE) lancets Use to check blood sugar 2-3 times daily.  Dx E11.8  . pantoprazole (PROTONIX) 40 MG tablet TAKE 1 TABLET BY MOUTH ONCE DAILY (MAKE APPT FOR FURTHER REFILLS)  . Pitavastatin Calcium (LIVALO) 4 MG TABS Take 1 tablet (4 mg total) by mouth daily.     Allergies:   Metformin and related and Codeine   Social History   Socioeconomic History  . Marital status: Married    Spouse name: Not on file  . Number of children: 2  . Years of education: Not on file  . Highest education level: Not on file  Occupational History  . Occupation: Optician, dispensing: [employer]  . Financial resource strain: Not on file  . Food insecurity:    Worry: Not on file    Inability: Not on file  . Transportation needs:    Medical: Not on file    Non-medical: Not on file  Tobacco Use  . Smoking status: Former Smoker    Packs/day: 1.00    Types: Cigarettes    Start date: 06/11/2014  . Smokeless tobacco: Never Used  . Tobacco comment: pt doing the vapor cigerette   Substance and Sexual Activity  . Alcohol use: Yes    Alcohol/week: 0.0 oz    Comment: rarely  . Drug use: No  . Sexual activity: Not on file  Lifestyle  . Physical activity:    Days per week: Not on file    Minutes per session: Not on file  . Stress: Not on file  Relationships  . Social connections:    Talks on phone: Not on file    Gets together: Not on file    Attends religious service: Not on file    Active member of club or  organization: Not on file    Attends meetings of clubs or organizations: Not on file    Relationship status: Not on file  Other Topics Concern  . 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 [employer]   Married   2 grown children (2 grandchildren)   Enjoys reading     Family History: The patient's family history includes Anxiety disorder in her mother; Bipolar disorder in her mother; Colon cancer in her paternal grandfather; Depression in her mother; Diabetes in her father, mother, other, and sister; Healthy in her son; Hypertension in her father, other,  and sister; Hypertension (age of onset: 26) in her mother; Kidney failure in her father; Lung cancer in her father; Mental illness in her brother and mother; Multiple sclerosis in her maternal aunt; Obesity in her mother; Stroke in her mother and sister.  ROS:   Review of Systems  Constitution: Positive for weight loss (purposeful).  HENT: Negative.   Eyes: Negative.   Cardiovascular: Positive for palpitations (rapid HR in the past).  Respiratory: Negative.   Endocrine: Negative.   Hematologic/Lymphatic: Negative.   Skin: Negative.   Musculoskeletal: Positive for back pain.  Gastrointestinal: Negative.   Genitourinary: Negative.   Neurological: Negative.   Psychiatric/Behavioral: Negative.   Allergic/Immunologic: Negative.    Please see the history of present illness.     All other systems reviewed and are negative.  EKGs/Labs/Other Studies Reviewed:    The following studies were reviewed today:  EKG 09/14/2017 independently reviewed sinus rhythm QS in V1 V2 consider anterior septal MI   EKG in epic care everywhere 12/05/2015 described as normal Recent Labs: Recent A1c 5.9 09/14/2017: ALT 28; BUN 14; Creatinine, Ser 0.63; Hemoglobin 15.8; Platelets 300; Potassium 4.4; Sodium 141; TSH 4.030  Recent Lipid Panel    Component Value Date/Time   CHOL 193 09/14/2017 1727   TRIG 110 09/14/2017 1727   HDL 44  09/14/2017 1727   CHOLHDL 4.4 09/14/2017 1727   CHOLHDL 5.4 (H) 05/21/2016 1532   VLDL 43 (H) 05/21/2016 1532   LDLCALC 127 (H) 09/14/2017 1727   LDLDIRECT 201.4 02/06/2014 1139    Physical Exam:    VS:  BP 126/72 (BP Location: Right Arm, Patient Position: Sitting, Cuff Size: Normal)   Pulse 70   Ht 5\' 4"  (1.626 m)   Wt 181 lb (82.1 kg)   SpO2 98%   BMI 31.07 kg/m     Wt Readings from Last 3 Encounters:  10/14/17 181 lb (82.1 kg)  09/14/17 190 lb 9.6 oz (86.5 kg)  03/16/17 215 lb (97.5 kg)     GEN:  Well nourished, well developed in no acute distress HEENT: Normal NECK: No JVD; No carotid bruits LYMPHATICS: No lymphadenopathy CARDIAC: RRR, no murmurs, rubs, gallops RESPIRATORY:  Clear to auscultation without rales, wheezing or rhonchi  ABDOMEN: Soft, non-tender, non-distended MUSCULOSKELETAL:  No edema; No deformity  SKIN: Warm and dry NEUROLOGIC:  Alert and oriented x 3 PSYCHIATRIC:  Normal affect     Signed, Shirlee More, MD  10/14/2017 9:57 AM    [place]

## 2017-10-14 ENCOUNTER — Encounter: Payer: Self-pay | Admitting: Cardiology

## 2017-10-14 ENCOUNTER — Ambulatory Visit (INDEPENDENT_AMBULATORY_CARE_PROVIDER_SITE_OTHER): Payer: No Typology Code available for payment source | Admitting: Cardiology

## 2017-10-14 VITALS — BP 126/72 | HR 70 | Ht 64.0 in | Wt 181.0 lb

## 2017-10-14 DIAGNOSIS — E785 Hyperlipidemia, unspecified: Secondary | ICD-10-CM | POA: Diagnosis not present

## 2017-10-14 DIAGNOSIS — E118 Type 2 diabetes mellitus with unspecified complications: Secondary | ICD-10-CM | POA: Diagnosis not present

## 2017-10-14 DIAGNOSIS — I10 Essential (primary) hypertension: Secondary | ICD-10-CM | POA: Diagnosis not present

## 2017-10-14 DIAGNOSIS — E1165 Type 2 diabetes mellitus with hyperglycemia: Secondary | ICD-10-CM

## 2017-10-14 DIAGNOSIS — IMO0002 Reserved for concepts with insufficient information to code with codable children: Secondary | ICD-10-CM

## 2017-10-14 DIAGNOSIS — R9431 Abnormal electrocardiogram [ECG] [EKG]: Secondary | ICD-10-CM | POA: Diagnosis not present

## 2017-10-14 NOTE — Patient Instructions (Signed)
Medication Instructions:  Your physician recommends that you continue on your current medications as directed. Please refer to the Current Medication list given to you today.   Labwork: NONE  Testing/Procedures: Your physician has requested that you have an echocardiogram. Echocardiography is a painless test that uses sound waves to create images of your heart. It provides your doctor with information about the size and shape of your heart and how well your heart's chambers and valves are working. This procedure takes approximately one hour. There are no restrictions for this procedure.    Follow-Up: Your physician recommends that you schedule a follow-up appointment as needed or if symptoms worsen or fail to improve.  Any Other Special Instructions Will Be Listed Below (If Applicable).     If you need a refill on your cardiac medications before your next appointment, please call your pharmacy.   

## 2017-11-17 ENCOUNTER — Other Ambulatory Visit: Payer: No Typology Code available for payment source

## 2017-12-19 ENCOUNTER — Ambulatory Visit (INDEPENDENT_AMBULATORY_CARE_PROVIDER_SITE_OTHER): Payer: No Typology Code available for payment source | Admitting: Family

## 2017-12-19 ENCOUNTER — Encounter: Payer: Self-pay | Admitting: Family

## 2017-12-19 VITALS — BP 130/80 | HR 64 | Temp 97.8°F | Resp 16 | Ht 64.0 in | Wt 175.0 lb

## 2017-12-19 DIAGNOSIS — R232 Flushing: Secondary | ICD-10-CM

## 2017-12-19 DIAGNOSIS — E119 Type 2 diabetes mellitus without complications: Secondary | ICD-10-CM | POA: Diagnosis not present

## 2017-12-19 DIAGNOSIS — Z23 Encounter for immunization: Secondary | ICD-10-CM | POA: Diagnosis not present

## 2017-12-19 DIAGNOSIS — E785 Hyperlipidemia, unspecified: Secondary | ICD-10-CM

## 2017-12-19 DIAGNOSIS — G4700 Insomnia, unspecified: Secondary | ICD-10-CM

## 2017-12-19 LAB — TSH: TSH: 3.94 u[IU]/mL (ref 0.35–4.50)

## 2017-12-19 LAB — HEMOGLOBIN A1C: HEMOGLOBIN A1C: 6.2 % (ref 4.6–6.5)

## 2017-12-19 MED ORDER — DULAGLUTIDE 1.5 MG/0.5ML ~~LOC~~ SOAJ
SUBCUTANEOUS | 5 refills | Status: DC
Start: 2017-12-19 — End: 2018-10-02

## 2017-12-19 MED FILL — TRULICITY 1.5 MG/0.5 ML PEN: 1.5 | 28 days supply | Qty: 2 | Fill #0

## 2017-12-19 NOTE — Patient Instructions (Signed)
Please complete lab work prior to leaving.   

## 2017-12-19 NOTE — Progress Notes (Signed)
Subjective:    Patient ID: Shelley Garcia, female    DOB: [birth year], 52 y.o.   MRN: [id number]  HPI  Patient is a 52 yr old female who presents today for follow up.  DM2- Ran out of Trulicity and took remaining [place]. Has lost 48 pounds.  Was doing optavia diet.  Reports that she quit due to cost and switched to weight watchers and gained 5 pounds.  Now has switched to Keto diet. Has been out of everything for a few days. Frustrated about inability to lose weight recently. Not exercising.   Wt Readings from Last 3 Encounters:  12/19/17 175 lb (79.4 kg)  10/14/17 181 lb (82.1 kg)  09/14/17 190 lb 9.6 oz (86.5 kg)    Lab Results  Component Value Date   HGBA1C 5.9 (H) 09/14/2017   HGBA1C 6.8 01/19/2017   HGBA1C 9.1 (H) 10/12/2016   Lab Results  Component Value Date   MICROALBUR 0.4 05/21/2016   LDLCALC 127 (H) 09/14/2017   CREATININE 0.63 09/14/2017   Insomnia- last visit she was given rx for trazodone.   Hyperlipidemia- tolerating livalo.   Reports LMP 6 months ago- + hot flashes.   Review of Systems    see HPI  Past Medical History:  Diagnosis Date  . B12 deficiency 02/12/2015  . Back pain   . Cervical spine pain 09/01/2015  . Depression with anxiety 07/29/2013  . Diabetes mellitus   . Diabetes mellitus type 2 with complications, uncontrolled ([place]) 10/06/2010   Diagnosed with Type II DM in ~ 2008; pt does not tolerate Metformin (GI upset). Has been on Lantus since 2011. Vision assessment every 2 years- Optometry.   Marland Kitchen Dyspnea on exertion 10/12/2016  . Elevated TSH 03/02/2017  . Esophageal reflux 10/06/2010  . Essential hypertension 11/23/2016  . Gallbladder problem   . GERD (gastroesophageal reflux disease)   . History of chicken pox   . History of migraine headaches 05/17/2012  . Hyperlipidemia   . Hypertension   . Joint pain   . Nonallopathic lesion of cervical region 11/18/2015  . Nonallopathic lesion of lumbosacral region 11/18/2015  . Nonallopathic lesion  of thoracic region 11/18/2015  . Obesity   . Other fatigue 10/12/2016  . Other hyperlipidemia 03/02/2017  . Trapezius strain 02/12/2015  . Type 2 diabetes mellitus without complication, without long-term current use of insulin ([place]) 10/12/2016  . Vitamin B12 deficiency   . Vitamin D deficiency      Social History   Socioeconomic History  . Marital status: Married    Spouse name: Not on file  . Number of children: 2  . Years of education: Not on file  . Highest education level: Not on file  Occupational History  . Occupation: Optician, dispensing: [employer]  . Financial resource strain: Not on file  . Food insecurity:    Worry: Not on file    Inability: Not on file  . Transportation needs:    Medical: Not on file    Non-medical: Not on file  Tobacco Use  . Smoking status: Former Smoker    Packs/day: 1.00    Types: Cigarettes    Start date: 06/11/2014  . Smokeless tobacco: Never Used  . Tobacco comment: pt doing the vapor cigerette   Substance and Sexual Activity  . Alcohol use: Yes    Alcohol/week: 0.0 standard drinks    Comment: rarely  . Drug use: No  . Sexual activity: Not on file  Lifestyle  . Physical activity:    Days per week: Not on file    Minutes per session: Not on file  . Stress: Not on file  Relationships  . Social connections:    Talks on phone: Not on file    Gets together: Not on file    Attends religious service: Not on file    Active member of club or organization: Not on file    Attends meetings of clubs or organizations: Not on file    Relationship status: Not on file  . Intimate partner violence:    Fear of current or ex partner: Not on file    Emotionally abused: Not on file    Physically abused: Not on file    Forced sexual activity: Not on file  Other Topics Concern  . 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 CHOLECYSTECTOMY    . EXPLORATORY LAPAROTOMY    . KIDNEY STONE SURGERY    . WISDOM TOOTH EXTRACTION  07/31/2013    Family History  Problem Relation Age of Onset  . Hypertension Father   . Diabetes Father   . Lung cancer Father   . Kidney failure Father   . Hypertension Mother 44       Deceased  . Diabetes Mother   . Mental illness Mother        Schizophrenia  . Stroke Mother   . Bipolar disorder Mother   . Depression Mother   . Anxiety disorder Mother   . Obesity Mother   . Diabetes Sister   . Stroke Sister   . Mental illness Brother   . Colon cancer Paternal Grandfather   . Diabetes Other        Maternal/Paternal side  . Hypertension Other        Maternal/Paternal side  . Multiple sclerosis Maternal Aunt   . Hypertension Sister   . Healthy Son        x2    Allergies  Allergen Reactions  . Metformin And Related Nausea And Vomiting    Made pt really sick  . Codeine Rash    Current Outpatient Medications on File Prior to Visit  Medication Sig Dispense Refill  . Blood Glucose Monitoring Suppl (FREESTYLE LITE) DEVI Use as instructed 2 to 3 times daily to check blood sugar.  Dx E11.8 1 each 0  . glucose blood (FREESTYLE LITE) test strip Use as instructed to check blood sugar 2-3 times a day.  Dx  E11.8 100 each 5  . Lancets (FREESTYLE) lancets Use to check blood sugar 2-3 times daily.  Dx E11.8 100 each 5  . pantoprazole (PROTONIX) 40 MG tablet TAKE 1 TABLET BY MOUTH ONCE DAILY (MAKE APPT FOR FURTHER REFILLS) 30 tablet 5  . Pitavastatin Calcium (LIVALO) 4 MG TABS Take 1 tablet (4 mg total) by mouth daily. 30 tablet 5  . Dulaglutide (TRULICITY) 1.5 MV/7.8IO SOPN INJECT 1.5 MG (1 PEN) INTO THE SKIN ONCE A WEEK. (Patient not taking: Reported on 12/19/2017) 4 pen 5   No current facility-administered medications on file prior to visit.     BP 130/80 (BP Location: Left Arm, Cuff Size: Normal)   Pulse 64   Temp 97.8 F (36.6  C) (Oral)   Resp 16   Ht 5\' 4"  (  1.626 m)   Wt 175 lb (79.4 kg)   SpO2 99%   BMI 30.04 kg/m    Objective:   Physical Exam  Constitutional: She is oriented to person, place, and time. She appears well-developed and well-nourished.  Cardiovascular: Normal rate, regular rhythm and normal heart sounds.  No murmur heard. Pulmonary/Chest: Effort normal and breath sounds normal. No respiratory distress. She has no wheezes.  Musculoskeletal: She exhibits no edema.  Neurological: She is alert and oriented to person, place, and time.  Skin: Skin is warm and dry.  Psychiatric: She has a normal mood and affect. Her behavior is normal. Judgment and thought content normal.          Assessment & Plan:  DM2- she has lost some weight. Will continue trulicity. May not need to restart jardiance.  Hot flashes- she wishes to avoid HRT. We discussed trial of effexor, citalopram or gabapentin. She will think about it and let me know if she decides she would like to try one of these medications.   Insomnia- unchanged, declines trazodone.  Hyperlipidemia- LDL at goal on livalo, continue same.

## 2017-12-20 ENCOUNTER — Encounter: Payer: Self-pay | Admitting: Family

## 2017-12-21 MED FILL — FREESTYLE LITE TEST STRIP: 33 days supply | Qty: 100 | Fill #0

## 2018-01-20 MED FILL — TRULICITY 1.5 MG/0.5 ML PEN: 1.5 | 28 days supply | Qty: 2 | Fill #0

## 2018-02-20 MED FILL — TRULICITY 1.5 MG/0.5 ML PEN: 1.5 | 28 days supply | Qty: 2 | Fill #1

## 2018-02-26 IMAGING — NM NM BONE WHOLE BODY
2 series · 2 of 2 positions shown · non-contrast
Comparison: Thoracic MRI, 03/17/2017

CLINICAL DATA: Bone lesions seen on a thoracic MRI dated
03/17/2017. Patient complaining of mid back pain. History of
arthritis.

EXAM:
NUCLEAR MEDICINE WHOLE BODY BONE SCAN
TECHNIQUE: Whole body anterior and posterior images were obtained approximately
3 hours after intravenous injection of radiopharmaceutical.
RADIOPHARMACEUTICALS:  22.0 mCi 1echnetium-EEm MDP IV

[Series 1: whole body · 2.66mm/px · 1 of 1 slices shown (1 of 2)]
[im 1/1]
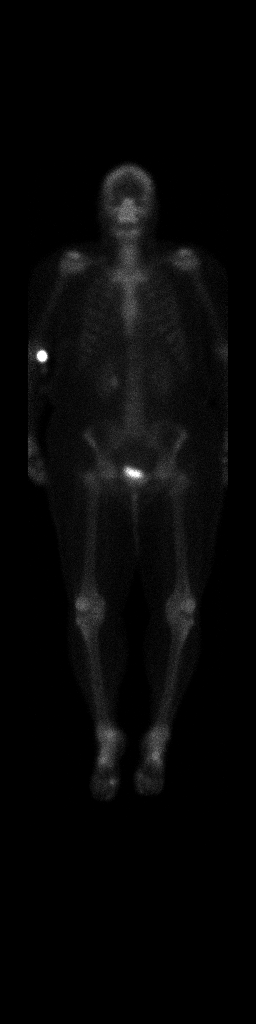

[Series 1: whole body · 2.66mm/px · 1 of 1 slices shown (2 of 2)]
[im 1/1]
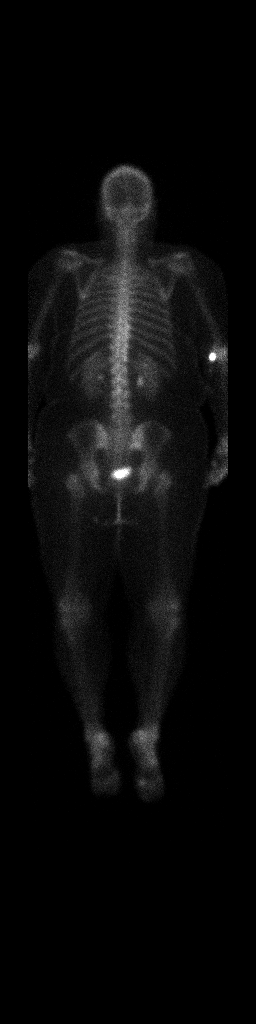

[2 of 2 positions shown; findings below may reference images not displayed]

FINDINGS: There is no abnormal uptake in the upper lumbar spine to correspond
to the 1.1 cm lesion noted in L2 along the prior thoracic MRI. This
supports a benign lesion.

There are no areas of abnormal radiotracer localization to suggest
neoplastic disease to bone or an occult fracture.

Uptake is seen involving the shoulders, elbows, hands and wrists, SI
joints, knees, feet and ankles all of which appears degenerative.

Renal uptake is symmetric.
IMPRESSION: 1. No evidence of neoplastic disease to bone.
2. No abnormal uptake is seen to correspond to the L2 lesion noted
on the recent prior thoracic MRI.
3. There is significant multi joint, relatively symmetric,
degenerative uptake.

## 2018-03-17 MED FILL — TRULICITY 1.5 MG/0.5 ML PEN: 1.5 | 28 days supply | Qty: 2 | Fill #2

## 2018-04-20 MED FILL — TRULICITY 1.5 MG/0.5 ML PEN: 1.5 | 28 days supply | Qty: 2 | Fill #3

## 2018-05-24 MED FILL — TRULICITY 1.5 MG/0.5 ML PEN: 1.5 | 28 days supply | Qty: 2 | Fill #4

## 2018-07-04 MED FILL — TRULICITY 1.5 MG/0.5 ML PEN: 1.5 | 28 days supply | Qty: 2 | Fill #0

## 2018-07-31 MED FILL — TRULICITY 1.5 MG/0.5 ML PEN: 1.5 | 28 days supply | Qty: 2 | Fill #1

## 2018-09-02 MED FILL — TRULICITY 1.5 MG/0.5 ML PEN: 1.5 | 28 days supply | Qty: 2 | Fill #2

## 2018-10-02 ENCOUNTER — Other Ambulatory Visit: Payer: Self-pay | Admitting: Family

## 2018-10-02 DIAGNOSIS — E119 Type 2 diabetes mellitus without complications: Secondary | ICD-10-CM

## 2018-10-02 MED FILL — TRULICITY 1.5 MG/0.5 ML PEN: 1.5 | 28 days supply | Qty: 2 | Fill #0

## 2018-11-07 ENCOUNTER — Encounter: Payer: Self-pay | Admitting: Family

## 2018-11-07 ENCOUNTER — Ambulatory Visit (INDEPENDENT_AMBULATORY_CARE_PROVIDER_SITE_OTHER): Payer: No Typology Code available for payment source | Admitting: Family

## 2018-11-07 ENCOUNTER — Other Ambulatory Visit: Payer: Self-pay

## 2018-11-07 VITALS — BP 113/53 | HR 70 | Temp 97.9°F | Resp 16 | Ht 64.0 in | Wt 190.0 lb

## 2018-11-07 DIAGNOSIS — K219 Gastro-esophageal reflux disease without esophagitis: Secondary | ICD-10-CM | POA: Diagnosis not present

## 2018-11-07 DIAGNOSIS — Z0000 Encounter for general adult medical examination without abnormal findings: Secondary | ICD-10-CM

## 2018-11-07 DIAGNOSIS — E785 Hyperlipidemia, unspecified: Secondary | ICD-10-CM

## 2018-11-07 DIAGNOSIS — Z0001 Encounter for general adult medical examination with abnormal findings: Secondary | ICD-10-CM | POA: Diagnosis not present

## 2018-11-07 DIAGNOSIS — B351 Tinea unguium: Secondary | ICD-10-CM

## 2018-11-07 DIAGNOSIS — Z5181 Encounter for therapeutic drug level monitoring: Secondary | ICD-10-CM

## 2018-11-07 DIAGNOSIS — E119 Type 2 diabetes mellitus without complications: Secondary | ICD-10-CM | POA: Diagnosis not present

## 2018-11-07 LAB — HEPATIC FUNCTION PANEL
ALT: 22 U/L (ref 0–35)
AST: 19 U/L (ref 0–37)
Albumin: 4.5 g/dL (ref 3.5–5.2)
Alkaline Phosphatase: 52 U/L (ref 39–117)
Bilirubin, Direct: 0.1 mg/dL (ref 0.0–0.3)
Total Bilirubin: 0.7 mg/dL (ref 0.2–1.2)
Total Protein: 7.1 g/dL (ref 6.0–8.3)

## 2018-11-07 LAB — BASIC METABOLIC PANEL
BUN: 16 mg/dL (ref 6–23)
CO2: 28 mEq/L (ref 19–32)
Calcium: 9.6 mg/dL (ref 8.4–10.5)
Chloride: 102 mEq/L (ref 96–112)
Creatinine, Ser: 0.67 mg/dL (ref 0.40–1.20)
GFR: 92.72 mL/min (ref >60.00)
Glucose, Bld: 121 mg/dL — ABNORMAL HIGH (ref 70–99)
Potassium: 4.5 mEq/L (ref 3.5–5.1)
Sodium: 139 mEq/L (ref 135–145)

## 2018-11-07 LAB — LIPID PANEL
Cholesterol: 223 mg/dL — ABNORMAL HIGH (ref 0–200)
HDL: 52 mg/dL (ref >39.00)
LDL Cholesterol: 151 mg/dL — ABNORMAL HIGH (ref 0–99)
NonHDL: 171.16
Total CHOL/HDL Ratio: 4
Triglycerides: 99 mg/dL (ref 0.0–149.0)
VLDL: 19.8 mg/dL (ref 0.0–40.0)

## 2018-11-07 LAB — CBC WITH DIFFERENTIAL/PLATELET
Basophils Absolute: 0 10*3/uL (ref 0.0–0.1)
Basophils Relative: 0.9 % (ref 0.0–3.0)
Eosinophils Absolute: 0.1 10*3/uL (ref 0.0–0.7)
Eosinophils Relative: 1.6 % (ref 0.0–5.0)
HCT: 45.2 % (ref 36.0–46.0)
Hemoglobin: 15.1 g/dL — ABNORMAL HIGH (ref 12.0–15.0)
Lymphocytes Relative: 35 % (ref 12.0–46.0)
Lymphs Abs: 1.5 10*3/uL (ref 0.7–4.0)
MCHC: 33.4 g/dL (ref 30.0–36.0)
MCV: 94.7 fl (ref 78.0–100.0)
Monocytes Absolute: 0.4 10*3/uL (ref 0.1–1.0)
Monocytes Relative: 8.8 % (ref 3.0–12.0)
Neutro Abs: 2.3 10*3/uL (ref 1.4–7.7)
Neutrophils Relative %: 53.7 % (ref 43.0–77.0)
Platelets: 262 10*3/uL (ref 150.0–400.0)
RBC: 4.77 Mil/uL (ref 3.87–5.11)
RDW: 14 % (ref 11.5–15.5)
WBC: 4.3 10*3/uL (ref 4.0–10.5)

## 2018-11-07 LAB — HEMOGLOBIN A1C: Hgb A1c MFr Bld: 5.9 % (ref 4.6–6.5)

## 2018-11-07 LAB — TSH: TSH: 1.85 u[IU]/mL (ref 0.35–4.50)

## 2018-11-07 MED ORDER — PANTOPRAZOLE SODIUM 40 MG PO TBEC: DELAYED_RELEASE_TABLET | ORAL | 5 refills | Status: DC

## 2018-11-07 MED ORDER — TRULICITY 1.5 MG/0.5ML ~~LOC~~ SOAJ: SUBCUTANEOUS | 5 refills | Status: DC

## 2018-11-07 MED ORDER — TERBINAFINE HCL 250 MG PO TABS: 250.0000 mg | ORAL_TABLET | Freq: Every day | ORAL | 0 refills | Status: DC

## 2018-11-07 MED ORDER — LIVALO 4 MG PO TABS: 1.0000 | ORAL_TABLET | Freq: Every day | ORAL | 5 refills | Status: DC

## 2018-11-07 MED FILL — TRULICITY 1.5 MG/0.5 ML PEN: 1.5 | 28 days supply | Qty: 2 | Fill #0

## 2018-11-07 MED FILL — PANTOPRAZOLE SOD DR 40 MG T: 40 | 90 days supply | Qty: 90 | Fill #0

## 2018-11-07 MED FILL — TERBINAFINE HCL 250 MG TAB: 250 | 30 days supply | Qty: 30 | Fill #0

## 2018-11-07 MED FILL — LIVALO 4 MG TABLET: 4 | 90 days supply | Qty: 90 | Fill #0

## 2018-11-07 NOTE — Progress Notes (Signed)
Subjective:    Patient ID: Shelley Garcia, female    DOB: 06/03/1965, 53 y.o.   MRN: [id number]  HPI  Patient is a 53 yr old female who presents for cpx.   Patient presents today for complete physical.  Immunizations: declines shingrix.  tdap up to date.pneumovax up to date. Diet:  Did octavia for a while.  Wt Readings from Last 3 Encounters:  11/07/18 190 lb (86.2 kg)  12/19/17 175 lb (79.4 kg)  10/14/17 181 lb (82.1 kg)  Exercise: walks (has arthritis)  Colonoscopy:  Declines Pap Smear:  Declines  Mammogram:  Due Dental:  Up to date Vision: due, she will schedule  Lab Results  Component Value Date   HGBA1C 6.2 12/19/2017   HGBA1C 5.9 (H) 09/14/2017   HGBA1C 6.8 01/19/2017   Lab Results  Component Value Date   MICROALBUR 0.4 05/21/2016   LDLCALC 127 (H) 09/14/2017   CREATININE 0.63 09/14/2017   DM2- reports that she is not checking sugar regularly. Has been out of trulicity.    Hyperlipidemia- not taking statin.   Toenail problem- notes + thickening and discoloration of the left middle toenail   Review of Systems  Constitutional: Negative for unexpected weight change.  HENT: Negative for hearing loss and rhinorrhea.   Eyes: Negative for visual disturbance.  Respiratory: Negative for cough and shortness of breath.   Cardiovascular: Negative for chest pain and leg swelling.  Gastrointestinal: Negative for constipation and diarrhea.  Genitourinary: Negative for dysuria and frequency.  Musculoskeletal: Positive for arthralgias. Negative for myalgias.  Skin: Negative for rash.  Neurological:       Occasional HA's/ and infrequent migraines  Hematological: Negative for adenopathy.  Psychiatric/Behavioral:       Denies depression/anxiety   Past Medical History:  Diagnosis Date  . B12 deficiency 02/12/2015  . Back pain   . Cervical spine pain 09/01/2015  . Depression with anxiety 07/29/2013  . Diabetes mellitus   . Diabetes mellitus type 2 with complications,  uncontrolled ([place]) 10/06/2010   Diagnosed with Type II DM in ~ 2008; pt does not tolerate Metformin (GI upset). Has been on Lantus since 2011. Vision assessment every 2 years- Optometry.   Marland Kitchen Dyspnea on exertion 10/12/2016  . Elevated TSH 03/02/2017  . Esophageal reflux 10/06/2010  . Essential hypertension 11/23/2016  . Gallbladder problem   . GERD (gastroesophageal reflux disease)   . History of chicken pox   . History of migraine headaches 05/17/2012  . Hyperlipidemia   . Hypertension   . Joint pain   . Nonallopathic lesion of cervical region 11/18/2015  . Nonallopathic lesion of lumbosacral region 11/18/2015  . Nonallopathic lesion of thoracic region 11/18/2015  . Obesity   . Other fatigue 10/12/2016  . Other hyperlipidemia 03/02/2017  . Trapezius strain 02/12/2015  . Type 2 diabetes mellitus without complication, without long-term current use of insulin ([place]) 10/12/2016  . Vitamin B12 deficiency   . Vitamin D deficiency      Social History   Socioeconomic History  . Marital status: Married    Spouse name: Not on file  . Number of children: 2  . Years of education: Not on file  . Highest education level: Not on file  Occupational History  . Occupation: Optician, dispensing: [employer]  . Financial resource strain: Not on file  . Food insecurity    Worry: Not on file    Inability: Not on file  . Transportation needs  Medical: Not on file    Non-medical: Not on file  Tobacco Use  . Smoking status: Former Smoker    Packs/day: 1.00    Types: Cigarettes    Start date: 06/11/2014  . Smokeless tobacco: Never Used  . Tobacco comment: pt doing the vapor cigerette   Substance and Sexual Activity  . Alcohol use: Yes    Alcohol/week: 0.0 standard drinks    Comment: rarely  . Drug use: No  . Sexual activity: Not on file  Lifestyle  . Physical activity    Days per week: Not on file    Minutes per session: Not on file  . Stress: Not on file  Relationships  . Social  Herbalist on phone: Not on file    Gets together: Not on file    Attends religious service: Not on file    Active member of club or organization: Not on file    Attends meetings of clubs or organizations: Not on file    Relationship status: Not on file  . Intimate partner violence    Fear of current or ex partner: Not on file    Emotionally abused: Not on file    Physically abused: Not on file    Forced sexual activity: Not on file  Other Topics Concern  . 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 CHOLECYSTECTOMY    . EXPLORATORY LAPAROTOMY    . KIDNEY STONE SURGERY    . WISDOM TOOTH EXTRACTION  07/31/2013    Family History  Problem Relation Age of Onset  . Hypertension Father   . Diabetes Father   . Lung cancer Father   . Kidney failure Father   . Hypertension Mother 44       Deceased  . Diabetes Mother   . Mental illness Mother        Schizophrenia  . Stroke Mother   . Bipolar disorder Mother   . Depression Mother   . Anxiety disorder Mother   . Obesity Mother   . Diabetes Sister   . Stroke Sister   . Mental illness Brother   . Colon cancer Paternal Grandfather   . Diabetes Other        Maternal/Paternal side  . Hypertension Other        Maternal/Paternal side  . Multiple sclerosis Maternal Aunt   . Hypertension Sister   . Healthy Son        x2    Allergies  Allergen Reactions  . Metformin And Related Nausea And Vomiting    Made pt really sick  . Codeine Rash    Current Outpatient Medications on File Prior to Visit  Medication Sig Dispense Refill  . Blood Glucose Monitoring Suppl (FREESTYLE LITE) DEVI Use as instructed 2 to 3 times daily to check blood sugar.  Dx E11.8 1 each 0  . glucose blood (FREESTYLE LITE) test strip Use as instructed to check blood sugar 2-3 times a day.  Dx  E11.8  100 each 5  . Lancets (FREESTYLE) lancets Use to check blood sugar 2-3 times daily.  Dx E11.8 100 each 5  . pantoprazole (PROTONIX) 40 MG tablet TAKE 1 TABLET BY MOUTH ONCE DAILY (MAKE APPT FOR FURTHER REFILLS) (Patient not taking: Reported on 11/07/2018) 30 tablet  5  . Pitavastatin Calcium (LIVALO) 4 MG TABS Take 1 tablet (4 mg total) by mouth daily. (Patient not taking: Reported on 11/07/2018) 30 tablet 5  . TRULICITY 1.5 NL/9.7QB SOPN INJECT 1.5 MG (1 PEN) INTO THE SKIN ONCE A WEEK. (Patient not taking: Reported on 11/07/2018) 2 mL 0   No current facility-administered medications on file prior to visit.     BP (!) 113/53 (BP Location: Right Arm, Patient Position: Sitting, Cuff Size: Large)   Pulse 70   Temp 97.9 F (36.6 C) (Temporal)   Resp 16   Ht 5\' 4"  (1.626 m)   Wt 190 lb (86.2 kg)   SpO2 100%   BMI 32.61 kg/m        Objective:   Physical Exam Constitutional:      Appearance: She is well-developed.  HENT:     Right Ear: Tympanic membrane and ear canal normal.     Left Ear: Tympanic membrane and ear canal normal.  Neck:     Musculoskeletal: Neck supple.     Thyroid: No thyroid mass or thyromegaly.  Cardiovascular:     Rate and Rhythm: Normal rate and regular rhythm.     Heart sounds: Normal heart sounds. No murmur.  Pulmonary:     Effort: Pulmonary effort is normal. No respiratory distress.     Breath sounds: Normal breath sounds. No wheezing.  Lymphadenopathy:     Cervical: No cervical adenopathy.  Skin:    General: Skin is warm and dry.     Comments:  + thickening and discoloration of the left middle toenail  Neurological:     Mental Status: She is alert and oriented to person, place, and time.  Psychiatric:        Behavior: Behavior normal.        Thought Content: Thought content normal.        Judgment: Judgment normal.           Assessment & Plan:  Preventative care-we discussed healthy diet, exercise, and weight loss.  Specifically we discussed  adding some light weight training and/or yoga to help build muscle mass and help her metabolism.  Continue regular walking.  She finds herself extremely hungry all the time and we discussed importance of making sure that she is eating lean protein with each meal.  She is agreeable to mammogram.  Declines Shingrix at this time.  Tetanus is up-to-date.  She declines colonoscopy or Cologuard at this time.  She declines Pap smear.  We discussed risk of not completing preventative care screenings.  Diabetes type 2- will obtain follow-up A1c and urine microalbumin.  Hyperlipidemia-not on statin.  Will check follow-up lipid panel.  We discussed restarting statin.  Onychomycosis- will initiate Lamisil 250 mg once daily.  Patient is advised to come to the lab in 1 month for follow-up LFT.  If LFTs stable plan to continue for an additional 8 weeks.

## 2018-11-14 ENCOUNTER — Other Ambulatory Visit: Payer: Self-pay

## 2018-11-14 ENCOUNTER — Encounter (HOSPITAL_BASED_OUTPATIENT_CLINIC_OR_DEPARTMENT_OTHER): Payer: Self-pay

## 2018-11-14 ENCOUNTER — Ambulatory Visit (HOSPITAL_BASED_OUTPATIENT_CLINIC_OR_DEPARTMENT_OTHER)
Admission: RE | Admit: 2018-11-14 | Discharge: 2018-11-14 | Disposition: A | Discharge status: Home / Self Care | Payer: No Typology Code available for payment source | Source: Ambulatory Visit | Attending: Family | Admitting: Family

## 2018-11-14 DIAGNOSIS — R928 Other abnormal and inconclusive findings on diagnostic imaging of breast: Secondary | ICD-10-CM | POA: Diagnosis not present

## 2018-11-14 DIAGNOSIS — Z1231 Encounter for screening mammogram for malignant neoplasm of breast: Secondary | ICD-10-CM | POA: Insufficient documentation

## 2018-11-14 DIAGNOSIS — Z0000 Encounter for general adult medical examination without abnormal findings: Secondary | ICD-10-CM

## 2018-11-16 ENCOUNTER — Other Ambulatory Visit: Payer: Self-pay | Admitting: Family

## 2018-11-16 DIAGNOSIS — R928 Other abnormal and inconclusive findings on diagnostic imaging of breast: Secondary | ICD-10-CM

## 2018-11-22 ENCOUNTER — Other Ambulatory Visit: Payer: No Typology Code available for payment source

## 2018-11-22 ENCOUNTER — Ambulatory Visit
Admission: RE | Admit: 2018-11-22 | Discharge: 2018-11-22 | Disposition: A | Discharge status: Home / Self Care | Payer: No Typology Code available for payment source | Source: Ambulatory Visit | Attending: Family | Admitting: Family

## 2018-11-22 ENCOUNTER — Other Ambulatory Visit: Payer: Self-pay

## 2018-11-22 DIAGNOSIS — R928 Other abnormal and inconclusive findings on diagnostic imaging of breast: Secondary | ICD-10-CM

## 2018-12-05 ENCOUNTER — Other Ambulatory Visit (INDEPENDENT_AMBULATORY_CARE_PROVIDER_SITE_OTHER): Payer: No Typology Code available for payment source

## 2018-12-05 ENCOUNTER — Other Ambulatory Visit: Payer: Self-pay

## 2018-12-05 DIAGNOSIS — Z5181 Encounter for therapeutic drug level monitoring: Secondary | ICD-10-CM

## 2018-12-05 LAB — HEPATIC FUNCTION PANEL
ALT: 15 U/L (ref 0–35)
AST: 16 U/L (ref 0–37)
Albumin: 4 g/dL (ref 3.5–5.2)
Alkaline Phosphatase: 57 U/L (ref 39–117)
Bilirubin, Direct: 0.1 mg/dL (ref 0.0–0.3)
Total Bilirubin: 0.4 mg/dL (ref 0.2–1.2)
Total Protein: 6.7 g/dL (ref 6.0–8.3)

## 2018-12-06 ENCOUNTER — Other Ambulatory Visit: Payer: Self-pay | Admitting: Family

## 2018-12-06 ENCOUNTER — Encounter: Payer: Self-pay | Admitting: Family

## 2018-12-06 MED ORDER — TERBINAFINE HCL 250 MG PO TABS: 250.0000 mg | ORAL_TABLET | Freq: Every day | ORAL | 1 refills | Status: DC

## 2018-12-06 MED FILL — TERBINAFINE HCL 250 MG TAB: 250 | 30 days supply | Qty: 30 | Fill #0

## 2018-12-08 MED FILL — TRULICITY 1.5 MG/0.5 ML PEN: 1.5 | 28 days supply | Qty: 2 | Fill #0

## 2019-01-09 ENCOUNTER — Encounter: Payer: Self-pay | Admitting: Family

## 2019-01-10 ENCOUNTER — Ambulatory Visit (INDEPENDENT_AMBULATORY_CARE_PROVIDER_SITE_OTHER): Payer: No Typology Code available for payment source | Admitting: Family

## 2019-01-10 ENCOUNTER — Other Ambulatory Visit: Payer: Self-pay

## 2019-01-10 DIAGNOSIS — S0502XA Injury of conjunctiva and corneal abrasion without foreign body, left eye, initial encounter: Secondary | ICD-10-CM

## 2019-01-10 MED ORDER — DICLOFENAC SODIUM 0.1 % OP SOLN: 1.0000 [drp] | Freq: Four times a day (QID) | OPHTHALMIC | 0 refills | Status: DC | PRN

## 2019-01-10 MED ORDER — ERYTHROMYCIN 5 MG/GM OP OINT: 1.0000 "application " | TOPICAL_OINTMENT | Freq: Four times a day (QID) | OPHTHALMIC | 0 refills | Status: AC

## 2019-01-10 NOTE — Progress Notes (Signed)
Virtual Visit via Video Note  I connected with Shelley Garcia on 01/10/19 at  8:40 AM EDT by a video enabled telemedicine application and verified that I am speaking with the correct person using two identifiers.  Location: Patient: home Provider: home   I discussed the limitations of evaluation and management by telemedicine and the availability of in person appointments. The patient expressed understanding and agreed to proceed.  History of Present Illness:  Patient is a 53 yr old female who presents today to discuss "scratch" to her left eye. She is currently out of town at [employer] in [place].  She reports that last night she rubbed her eye and if "felt like something was in it." Reports that she then had irritation of the left lateral eye believing that with rubbing she scratched her eye by accident. There was not trauma to the eye. She denies drainage or any changes in her vision, however she is having quite a bit of discomfort.  Past Medical History:  Diagnosis Date  . B12 deficiency 02/12/2015  . Back pain   . Cervical spine pain 09/01/2015  . Depression with anxiety 07/29/2013  . Diabetes mellitus   . Diabetes mellitus type 2 with complications, uncontrolled ([place]) 10/06/2010   Diagnosed with Type II DM in ~ 2008; pt does not tolerate Metformin (GI upset). Has been on Lantus since 2011. Vision assessment every 2 years- Optometry.   Marland Kitchen Dyspnea on exertion 10/12/2016  . Elevated TSH 03/02/2017  . Esophageal reflux 10/06/2010  . Essential hypertension 11/23/2016  . Gallbladder problem   . GERD (gastroesophageal reflux disease)   . History of chicken pox   . History of migraine headaches 05/17/2012  . Hyperlipidemia   . Hypertension   . Joint pain   . Nonallopathic lesion of cervical region 11/18/2015  . Nonallopathic lesion of lumbosacral region 11/18/2015  . Nonallopathic lesion of thoracic region 11/18/2015  . Obesity   . Other fatigue 10/12/2016  . Other hyperlipidemia 03/02/2017  .  Trapezius strain 02/12/2015  . Type 2 diabetes mellitus without complication, without long-term current use of insulin ([place]) 10/12/2016  . Vitamin B12 deficiency   . Vitamin D deficiency      Social History   Socioeconomic History  . Marital status: Married    Spouse name: Not on file  . Number of children: 2  . Years of education: Not on file  . Highest education level: Not on file  Occupational History  . Occupation: Optician, dispensing: [employer]  . Financial resource strain: Not on file  . Food insecurity    Worry: Not on file    Inability: Not on file  . Transportation needs    Medical: Not on file    Non-medical: Not on file  Tobacco Use  . Smoking status: Former Smoker    Packs/day: 1.00    Types: Cigarettes    Start date: 06/11/2014  . Smokeless tobacco: Never Used  . Tobacco comment: pt doing the vapor cigerette   Substance and Sexual Activity  . Alcohol use: Yes    Alcohol/week: 0.0 standard drinks    Comment: rarely  . Drug use: No  . Sexual activity: Not on file  Lifestyle  . Physical activity    Days per week: Not on file    Minutes per session: Not on file  . Stress: Not on file  Relationships  . Social connections    Talks on phone: Not on  file    Gets together: Not on file    Attends religious service: Not on file    Active member of club or organization: Not on file    Attends meetings of clubs or organizations: Not on file    Relationship status: Not on file  . Intimate partner violence    Fear of current or ex partner: Not on file    Emotionally abused: Not on file    Physically abused: Not on file    Forced sexual activity: Not on file  Other Topics Concern  . 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 CHOLECYSTECTOMY    . EXPLORATORY LAPAROTOMY    . KIDNEY STONE SURGERY     . WISDOM TOOTH EXTRACTION  07/31/2013    Family History  Problem Relation Age of Onset  . Hypertension Father   . Diabetes Father   . Lung cancer Father   . Kidney failure Father   . Hypertension Mother 1       Deceased  . Diabetes Mother   . Mental illness Mother        Schizophrenia  . Stroke Mother   . Bipolar disorder Mother   . Depression Mother   . Anxiety disorder Mother   . Obesity Mother   . Diabetes Sister   . Stroke Sister   . Mental illness Brother   . Colon cancer Paternal Grandfather   . Diabetes Other        Maternal/Paternal side  . Hypertension Other        Maternal/Paternal side  . Multiple sclerosis Maternal Aunt   . Hypertension Sister   . Healthy Son        x2    Allergies  Allergen Reactions  . Metformin And Related Nausea And Vomiting    Made pt really sick  . Codeine Rash    Current Outpatient Medications on File Prior to Visit  Medication Sig Dispense Refill  . Blood Glucose Monitoring Suppl (FREESTYLE LITE) DEVI Use as instructed 2 to 3 times daily to check blood sugar.  Dx E11.8 1 each 0  . Dulaglutide (TRULICITY) 1.5 0000000 SOPN INJECT 1.5 MG (1 PEN) INTO THE SKIN ONCE A WEEK. 2 mL 5  . glucose blood (FREESTYLE LITE) test strip Use as instructed to check blood sugar 2-3 times a day.  Dx  E11.8 100 each 5  . Lancets (FREESTYLE) lancets Use to check blood sugar 2-3 times daily.  Dx E11.8 100 each 5  . pantoprazole (PROTONIX) 40 MG tablet TAKE 1 TABLET BY MOUTH ONCE DAILY (MAKE APPT FOR FURTHER REFILLS) 30 tablet 5  . Pitavastatin Calcium (LIVALO) 4 MG TABS Take 1 tablet (4 mg total) by mouth daily. 30 tablet 5  . terbinafine (LAMISIL) 250 MG tablet Take 1 tablet (250 mg total) by mouth daily. 30 tablet 1   No current facility-administered medications on file prior to visit.     There were no vitals taken for this visit.   Observations/Objective:   Gen: Awake, alert, no acute  Eye: no obvious drainage or redness Resp:  Breathing is even and non-labored Psych: calm/pleasant demeanor Neuro: Alert and Oriented x 3, + facial symmetry, speech is clear.   Assessment and Plan:  Corneal Abrasion- will rx with erythromycin ointment and  as needed voltaren drops for pain.  Pt is advised to call if symptoms worsen or if symptoms fail to improve.   Follow Up Instructions:    I discussed the assessment and treatment plan with the patient. The patient was provided an opportunity to ask questions and all were answered. The patient agreed with the plan and demonstrated an understanding of the instructions.   The patient was advised to call back or seek an in-person evaluation if the symptoms worsen or if the condition fails to improve as anticipated.  Nance Pear, NP

## 2019-01-10 NOTE — Telephone Encounter (Signed)
Please contact pt to schedule a virtual visit today.  ?

## 2019-01-19 MED FILL — TRULICITY 1.5 MG/0.5 ML PEN: 1.5 | 28 days supply | Qty: 2 | Fill #1

## 2019-03-09 ENCOUNTER — Ambulatory Visit: Payer: No Typology Code available for payment source | Admitting: Family

## 2020-04-22 ENCOUNTER — Encounter: Payer: Self-pay | Admitting: Family

## 2020-04-29 ENCOUNTER — Ambulatory Visit (INDEPENDENT_AMBULATORY_CARE_PROVIDER_SITE_OTHER): Payer: No Typology Code available for payment source | Admitting: Family

## 2020-04-29 ENCOUNTER — Other Ambulatory Visit: Payer: Self-pay

## 2020-04-29 ENCOUNTER — Other Ambulatory Visit: Payer: Self-pay | Admitting: Family

## 2020-04-29 VITALS — BP 174/78 | HR 68 | Temp 98.5°F | Resp 16 | Ht 64.0 in | Wt 223.4 lb

## 2020-04-29 DIAGNOSIS — F418 Other specified anxiety disorders: Secondary | ICD-10-CM

## 2020-04-29 DIAGNOSIS — E785 Hyperlipidemia, unspecified: Secondary | ICD-10-CM | POA: Diagnosis not present

## 2020-04-29 DIAGNOSIS — K219 Gastro-esophageal reflux disease without esophagitis: Secondary | ICD-10-CM

## 2020-04-29 DIAGNOSIS — E119 Type 2 diabetes mellitus without complications: Secondary | ICD-10-CM

## 2020-04-29 DIAGNOSIS — I10 Essential (primary) hypertension: Secondary | ICD-10-CM

## 2020-04-29 MED ORDER — LIVALO 4 MG PO TABS: 1.0000 | ORAL_TABLET | Freq: Every day | ORAL | 1 refills | Status: DC

## 2020-04-29 MED ORDER — SERTRALINE HCL 50 MG PO TABS: ORAL_TABLET | ORAL | 0 refills | Status: DC

## 2020-04-29 MED ORDER — LISINOPRIL 10 MG PO TABS: 10.0000 mg | ORAL_TABLET | Freq: Every day | ORAL | 3 refills | Status: DC

## 2020-04-29 MED ORDER — PANTOPRAZOLE SODIUM 40 MG PO TBEC: DELAYED_RELEASE_TABLET | ORAL | 1 refills | Status: DC

## 2020-04-29 MED FILL — LISINOPRIL 10 MG TABS: 10 | 30 days supply | Qty: 30 | Fill #0

## 2020-04-29 MED FILL — PANTOPRAZOLE SOD DR 40 MG T: 40 | 90 days supply | Qty: 90 | Fill #0

## 2020-04-29 MED FILL — LIVALO 4 MG TABLET: 4 | 90 days supply | Qty: 90 | Fill #0

## 2020-04-29 MED FILL — SERTRALINE HCL 50 MG TABS: 50 | 30 days supply | Qty: 30 | Fill #0

## 2020-04-29 NOTE — Patient Instructions (Addendum)
Please schedule an appointment with a counselor.  Start 1/2 tab of zoloft 50mg  once daily, then increase to a full tab once daily on week two. Please take at bedtime. Please complete lab work prior to leaving.  Begin Lisinopril 10mg  once daily for blood pressure and kidney protection.

## 2020-04-29 NOTE — Progress Notes (Signed)
Subjective:    Patient ID: BEVIE MORTENSON, female    DOB: 1966/03/27, 55 y.o.   MRN: [id number]  HPI  Patient is a 55 yr old female who presents today to discuss worsening depression/anxiety symptoms.  Unfortunately, her 57-year-old granddaughter who lives locally was diagnosed with diffuse Intrinsic pontine glioma back in December.  She is currently receiving radiation therapy with plans to transition to clinical trials out of state eventually.  She is on high-dose steroids.  Since learning about this diagnosis the patient has been very distraught.  She has had increased anxiety symptoms and increased depression symptoms.  She is having trouble concentrating.  For example she states that she is an avid reader but is unable to sit down to read a book.  She works two 12 hour days a week.    She reports a significant family medical history of mental health disease which includes bipolar/schizophrenia in her mother, and depression in her brother.  Her brother committed suicide and mother had multiple suicide attempts which were unsuccessful.  Mom had bipolar/schizophrenia, brother committed suicide.  The patient does not believe that she has ever had any manic symptoms.  She denies suicidal ideation.  Diabetes type 2- She has been off of trulicity for 1 year.  She is very frustrated with her recent weight gain.  She reports that she has been stress eating due to her anxiety and depression.  Wt Readings from Last 3 Encounters:  04/29/20 223 lb 6.4 oz (101.3 kg)  11/07/18 190 lb (86.2 kg)  12/19/17 175 lb (79.4 kg)     Lab Results  Component Value Date   HGBA1C 5.9 11/07/2018   HGBA1C 6.2 12/19/2017   HGBA1C 5.9 (H) 09/14/2017   Lab Results  Component Value Date   MICROALBUR 0.4 05/21/2016   LDLCALC 151 (H) 11/07/2018   CREATININE 0.67 11/07/2018   GERD-previously on Protonix 40 mg.  Using otc omeprazole 20mg .  She notes some ongoing GERD symptoms and is also had some recent nausea.   Pretension  Review of Systems    see HPI  Past Medical History:  Diagnosis Date  . B12 deficiency 02/12/2015  . Back pain   . Cervical spine pain 09/01/2015  . Depression with anxiety 07/29/2013  . Diabetes mellitus   . Diabetes mellitus type 2 with complications, uncontrolled ([place]) 10/06/2010   Diagnosed with Type II DM in ~ 2008; pt does not tolerate Metformin (GI upset). Has been on Lantus since 2011. Vision assessment every 2 years- Optometry.   Marland Kitchen Dyspnea on exertion 10/12/2016  . Elevated TSH 03/02/2017  . Esophageal reflux 10/06/2010  . Essential hypertension 11/23/2016  . Gallbladder problem   . GERD (gastroesophageal reflux disease)   . History of chicken pox   . History of migraine headaches 05/17/2012  . Hyperlipidemia   . Hypertension   . Joint pain   . Nonallopathic lesion of cervical region 11/18/2015  . Nonallopathic lesion of lumbosacral region 11/18/2015  . Nonallopathic lesion of thoracic region 11/18/2015  . Obesity   . Other fatigue 10/12/2016  . Other hyperlipidemia 03/02/2017  . Trapezius strain 02/12/2015  . Type 2 diabetes mellitus without complication, without long-term current use of insulin ([place]) 10/12/2016  . Vitamin B12 deficiency   . Vitamin D deficiency      Social History   Socioeconomic History  . Marital status: Married    Spouse name: Not on file  . Number of children: 2  . Years of education: Not  on file  . Highest education level: Not on file  Occupational History  . Occupation: Optician, dispensing: [employer]  Tobacco Use  . Smoking status: Former Smoker    Packs/day: 1.00    Types: Cigarettes    Start date: 06/11/2014  . Smokeless tobacco: Never Used  . Tobacco comment: pt doing the vapor cigerette   Substance and Sexual Activity  . Alcohol use: Yes    Alcohol/week: 0.0 standard drinks    Comment: rarely  . Drug use: No  . Sexual activity: Not on file  Other Topics Concern  . 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 CHOLECYSTECTOMY    . EXPLORATORY LAPAROTOMY    . KIDNEY STONE SURGERY    . WISDOM TOOTH EXTRACTION  07/31/2013    Family History  Problem Relation Age of Onset  . Hypertension Father   . Diabetes Father   . Lung cancer Father   . Kidney failure Father   . Hypertension Mother 50       Deceased  . Diabetes Mother   . Mental illness Mother        Schizophrenia  . Stroke Mother   . Bipolar disorder Mother   . Depression Mother   . Anxiety disorder Mother   . Obesity Mother   . Diabetes Sister   . Stroke Sister   . Mental illness Brother   . Colon cancer Paternal Grandfather   . Diabetes Other        Maternal/Paternal side  . Hypertension Other        Maternal/Paternal side  . Multiple sclerosis Maternal Aunt   . Hypertension Sister   . Healthy Son        x2    Allergies  Allergen Reactions  . Metformin And Related Nausea And Vomiting    Made pt really sick  . Codeine Rash    Current Outpatient Medications on File Prior to Visit  Medication Sig Dispense Refill  . Blood Glucose Monitoring Suppl (FREESTYLE LITE) DEVI Use as instructed 2 to 3 times daily to check blood sugar.  Dx E11.8 1 each 0  . glucose blood (FREESTYLE LITE) test strip Use as instructed to check blood sugar 2-3 times a day.  Dx  E11.8 100 each 5  . Lancets (FREESTYLE) lancets Use to check blood sugar 2-3 times daily.  Dx E11.8 100 each 5  . Dulaglutide (TRULICITY) 1.5 ZO/1.0RU SOPN INJECT 1.5 MG (1 PEN) INTO THE SKIN ONCE A WEEK. (Patient not taking: Reported on 04/29/2020) 2 mL 5  . pantoprazole  (PROTONIX) 40 MG tablet TAKE 1 TABLET BY MOUTH ONCE DAILY (MAKE APPT FOR FURTHER REFILLS) (Patient not taking: Reported on 04/29/2020) 30 tablet 5  . Pitavastatin Calcium (LIVALO) 4 MG TABS Take 1 tablet (4 mg total) by mouth daily. (Patient not taking: Reported on 04/29/2020) 30 tablet 5   No current facility-administered medications on file prior to visit.    BP (!) 174/78 (BP Location: Right  Arm, Patient Position: Sitting, Cuff Size: Large)   Pulse 68   Temp 98.5 F (36.9 C) (Oral)   Resp 16   Ht 5\' 4"  (1.626 m)   Wt 223 lb 6.4 oz (101.3 kg)   SpO2 99%   BMI 38.35 kg/m    Objective:   Physical Exam Constitutional:      Appearance: She is well-developed and well-nourished.  Cardiovascular:     Rate and Rhythm: Normal rate and regular rhythm.     Heart sounds: Normal heart sounds. No murmur heard.   Pulmonary:     Effort: Pulmonary effort is normal. No respiratory distress.     Breath sounds: Normal breath sounds. No wheezing.  Psychiatric:        Mood and Affect: Mood is depressed. Affect is flat and tearful.        Behavior: Behavior normal.        Thought Content: Thought content normal.        Judgment: Judgment normal.           Assessment & Plan:  Depression/anxiety-uncontrolled.  Will initiate Zoloft 50 mg.  She is advised to begin with half tablet at bedtime once daily for 1 week then increase to a full tablet once daily on week 2.  We discussed importance of diet and exercise to avoid further weight gain.  We also discussed common side effects including sleepiness and nausea which should improve after few weeks.  Hypertension-this is a new diagnosis for her.  Repeat blood pressure check prior to leaving was 161/92.  Will initiate lisinopril 10 mg once daily.  Plan follow-up in 2 weeks for repeat basic metabolic panel.  Diabetes type 2-expect her A1c is likely up.  She previously took Musician.  If A1c is elevated, would suggest trial of Ozempic if insurance  covers it to help with weight loss as well as her sugars.  Hyperlipidemia-she has been off Livalo for a year.  Will check lipid panel as well as restart Livalo 4 mg once daily.  GERD-uncontrolled-will have her discontinue omeprazole 20 mg which she is taking over-the-counter, and instead began Protonix 40 mg.  This visit occurred during the SARS-CoV-2 public health emergency.  Safety protocols were in place, including screening questions prior to the visit, additional usage of staff PPE, and extensive cleaning of exam room while observing appropriate contact time as indicated for disinfecting solutions.

## 2020-04-30 ENCOUNTER — Other Ambulatory Visit: Payer: Self-pay | Admitting: Family

## 2020-04-30 ENCOUNTER — Telehealth: Payer: Self-pay | Admitting: Family

## 2020-04-30 LAB — HEMOGLOBIN A1C: Hgb A1c MFr Bld: 8.4 % — ABNORMAL HIGH (ref 4.6–6.5)

## 2020-04-30 LAB — COMPREHENSIVE METABOLIC PANEL
ALT: 16 U/L (ref 0–35)
AST: 17 U/L (ref 0–37)
Albumin: 4.2 g/dL (ref 3.5–5.2)
Alkaline Phosphatase: 60 U/L (ref 39–117)
BUN: 12 mg/dL (ref 6–23)
CO2: 31 mEq/L (ref 19–32)
Calcium: 9.4 mg/dL (ref 8.4–10.5)
Chloride: 100 mEq/L (ref 96–112)
Creatinine, Ser: 0.64 mg/dL (ref 0.40–1.20)
GFR: 101.42 mL/min (ref >60.00)
Glucose, Bld: 198 mg/dL — ABNORMAL HIGH (ref 70–99)
Potassium: 4.5 mEq/L (ref 3.5–5.1)
Sodium: 137 mEq/L (ref 135–145)
Total Bilirubin: 0.8 mg/dL (ref 0.2–1.2)
Total Protein: 7.1 g/dL (ref 6.0–8.3)

## 2020-04-30 LAB — LIPID PANEL
Cholesterol: 247 mg/dL — ABNORMAL HIGH (ref 0–200)
HDL: 50.7 mg/dL (ref >39.00)
LDL Cholesterol: 157 mg/dL — ABNORMAL HIGH (ref 0–99)
NonHDL: 196.3
Total CHOL/HDL Ratio: 5
Triglycerides: 199 mg/dL — ABNORMAL HIGH (ref 0.0–149.0)
VLDL: 39.8 mg/dL (ref 0.0–40.0)

## 2020-04-30 MED ORDER — OZEMPIC (0.25 OR 0.5 MG/DOSE) 2 MG/1.5ML ~~LOC~~ SOPN: PEN_INJECTOR | SUBCUTANEOUS | 5 refills | Status: DC

## 2020-04-30 MED FILL — OZEMPIC 0.25 OR 0.5 MG/DOSE: 2 | 42 days supply | Qty: 2 | Fill #0

## 2020-04-30 NOTE — Telephone Encounter (Signed)
Patient advised of results, provider's comments and new prescription. Also to work on her diet and exercise.

## 2020-04-30 NOTE — Telephone Encounter (Signed)
Please advise pt that her sugar is very high- A1C up to 8.4. It was 5.9 one year ago.  I would like her to add Ozempic once weekly injection (like trulicity but only dosed weekly).  This should help with weight loss and sugars. Continue to work on diabetic diet/exercise and weight loss.  Cholesterol is high- please restart livalo as we discussed at her visit.

## 2020-05-13 ENCOUNTER — Ambulatory Visit (INDEPENDENT_AMBULATORY_CARE_PROVIDER_SITE_OTHER): Payer: No Typology Code available for payment source | Admitting: Family

## 2020-05-13 ENCOUNTER — Encounter: Payer: Self-pay | Admitting: Family

## 2020-05-13 ENCOUNTER — Other Ambulatory Visit: Payer: Self-pay

## 2020-05-13 ENCOUNTER — Other Ambulatory Visit: Payer: Self-pay | Admitting: Family

## 2020-05-13 VITALS — BP 125/63 | HR 71 | Temp 97.8°F | Resp 16 | Ht 64.0 in | Wt 225.0 lb

## 2020-05-13 DIAGNOSIS — I10 Essential (primary) hypertension: Secondary | ICD-10-CM | POA: Diagnosis not present

## 2020-05-13 DIAGNOSIS — K219 Gastro-esophageal reflux disease without esophagitis: Secondary | ICD-10-CM

## 2020-05-13 DIAGNOSIS — E119 Type 2 diabetes mellitus without complications: Secondary | ICD-10-CM

## 2020-05-13 DIAGNOSIS — R110 Nausea: Secondary | ICD-10-CM | POA: Diagnosis not present

## 2020-05-13 DIAGNOSIS — F418 Other specified anxiety disorders: Secondary | ICD-10-CM | POA: Diagnosis not present

## 2020-05-13 MED ORDER — SERTRALINE HCL 50 MG PO TABS: 50.0000 mg | ORAL_TABLET | Freq: Every day | ORAL | 0 refills | Status: DC

## 2020-05-13 NOTE — Progress Notes (Signed)
Subjective:    Patient ID: Shelley Garcia, female    DOB: 1965-11-15, 55 y.o.   MRN: [id number]  HPI  Patient is a 55 yr old female who presents today for follow up.  Depression/anxiety- last visit we initiated zoloft 50mg .    HTN- last visit we added lisinopril 10 mg.  BP Readings from Last 3 Encounters:  05/13/20 125/63  04/29/20 (!) 174/78  11/07/18 (!) 113/53   DM2- Reports that her fasting sugars are in the 160's.  Post prandial, 260. Has had nausea since starting ozempic.  Did have some LUQ tenderness a few days ago but reports that this has resolved.   Lab Results  Component Value Date   HGBA1C 8.4 (H) 04/29/2020   HGBA1C 5.9 11/07/2018   HGBA1C 6.2 12/19/2017   Lab Results  Component Value Date   MICROALBUR 0.4 05/21/2016   LDLCALC 157 (H) 04/29/2020   CREATININE 0.64 04/29/2020   GERD- improved since we transitioned to 40mg  of protonix instead of omeprazole.  Depression/anxiety- reports much improvement in her symptoms on zoloft 50mg .   Review of Systems See HPI  Past Medical History:  Diagnosis Date  . B12 deficiency 02/12/2015  . Back pain   . Cervical spine pain 09/01/2015  . Depression with anxiety 07/29/2013  . Diabetes mellitus   . Diabetes mellitus type 2 with complications, uncontrolled ([place]) 10/06/2010   Diagnosed with Type II DM in ~ 2008; pt does not tolerate Metformin (GI upset). Has been on Lantus since 2011. Vision assessment every 2 years- Optometry.   Marland Kitchen Dyspnea on exertion 10/12/2016  . Elevated TSH 03/02/2017  . Esophageal reflux 10/06/2010  . Essential hypertension 11/23/2016  . Gallbladder problem   . GERD (gastroesophageal reflux disease)   . History of chicken pox   . History of migraine headaches 05/17/2012  . Hyperlipidemia   . Hypertension   . Joint pain   . Nonallopathic lesion of cervical region 11/18/2015  . Nonallopathic lesion of lumbosacral region 11/18/2015  . Nonallopathic lesion of thoracic region 11/18/2015  . Obesity   .  Other fatigue 10/12/2016  . Other hyperlipidemia 03/02/2017  . Trapezius strain 02/12/2015  . Type 2 diabetes mellitus without complication, without long-term current use of insulin ([place]) 10/12/2016  . Vitamin B12 deficiency   . Vitamin D deficiency      Social History   Socioeconomic History  . Marital status: Married    Spouse name: Not on file  . Number of children: 2  . Years of education: Not on file  . Highest education level: Not on file  Occupational History  . Occupation: Optician, dispensing: [employer]  Tobacco Use  . Smoking status: Former Smoker    Packs/day: 1.00    Types: Cigarettes    Start date: 06/11/2014  . Smokeless tobacco: Never Used  . Tobacco comment: pt doing the vapor cigerette   Substance and Sexual Activity  . Alcohol use: Yes    Alcohol/week: 0.0 standard drinks    Comment: rarely  . Drug use: No  . Sexual activity: Not on file  Other Topics Concern  . 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 CHOLECYSTECTOMY    . EXPLORATORY LAPAROTOMY    . KIDNEY STONE SURGERY    . WISDOM TOOTH EXTRACTION  07/31/2013    Family History  Problem Relation Age of Onset  . Hypertension Father   . Diabetes Father   . Lung cancer Father   . Kidney failure Father   . Hypertension Mother 109       Deceased  . Diabetes Mother   . Mental illness Mother        Schizophrenia  . Stroke Mother   . Bipolar disorder Mother   . Depression Mother   . Anxiety disorder Mother   . Obesity Mother   . Diabetes Sister   . Stroke Sister   . Mental illness  Brother   . Colon cancer Paternal Grandfather   . Diabetes Other        Maternal/Paternal side  . Hypertension Other        Maternal/Paternal side  . Multiple sclerosis Maternal Aunt   . Hypertension Sister   . Healthy Son        x2    Allergies  Allergen Reactions  . Metformin And Related Nausea And Vomiting    Made pt really sick  . Codeine Rash    Current Outpatient Medications on File Prior to Visit  Medication Sig Dispense Refill  . Blood Glucose Monitoring Suppl (FREESTYLE LITE) DEVI Use as instructed 2 to 3 times daily to check blood sugar.  Dx E11.8 1 each 0  . glucose blood (FREESTYLE LITE) test strip Use as instructed to check blood sugar 2-3 times a day.  Dx  E11.8 100 each 5  . Lancets (FREESTYLE) lancets Use to check blood sugar 2-3 times daily.  Dx E11.8 100 each 5  . lisinopril (ZESTRIL) 10 MG tablet Take 1 tablet (10 mg total) by mouth daily. 30 tablet 3  . pantoprazole (PROTONIX) 40 MG tablet TAKE 1 TABLET BY MOUTH ONCE DAILY (MAKE APPT FOR FURTHER REFILLS) 90 tablet 1  . Pitavastatin Calcium (LIVALO) 4 MG TABS Take 1 tablet (4 mg total) by mouth daily. 90 tablet 1  . Semaglutide,0.25 or 0.5MG /DOS, (OZEMPIC, 0.25 OR 0.5 MG/DOSE,) 2 MG/1.5ML SOPN 0.25 mg once SQ once weekly for 4 weeks, then increase to 0.5 mg SQ once weekly 1.5 mL 5  . sertraline (ZOLOFT) 50 MG tablet 1/2 tablet by mouth once daily for 1 week, then increase to a full tablet once daily on week two. 30 tablet 0   No current facility-administered medications on file prior to visit.    BP 125/63 (BP Location: Right Arm, Patient Position: Sitting, Cuff Size: Large)   Pulse 71   Temp 97.8 F (36.6 C) (Temporal)   Resp 16   Ht 5\' 4"  (1.626 m)   Wt 225 lb (102.1 kg)   SpO2 99%   BMI 38.62 kg/m       Objective:   Physical Exam Constitutional:      Appearance: She is well-developed and well-nourished.  Cardiovascular:     Rate and Rhythm: Normal rate and regular rhythm.     Heart sounds:  Normal heart sounds. No murmur heard.   Pulmonary:     Effort: Pulmonary effort is normal. No respiratory distress.     Breath sounds: Normal breath sounds. No wheezing.  Psychiatric:  Mood and Affect: Mood and affect normal.        Behavior: Behavior normal.        Thought Content: Thought content normal.        Judgment: Judgment normal.           Assessment & Plan:  Depression/anxiety- stable/improved. Continue zoloft 50mg .  HTN- bp is improved on lisinopril. Check cmet. Continue lisinopril 10mg .  GERD- improved. Continue protonix 40mg  once daily.   DM2- still having some hyperglycemia.  Recommended that she continue ozempic 0.5 weekly for the next 2 weeks. Then reach out to me to let me know how her nausea and sugar is doing. May adjust medication at that time.  This visit occurred during the SARS-CoV-2 public health emergency.  Safety protocols were in place, including screening questions prior to the visit, additional usage of staff PPE, and extensive cleaning of exam room while observing appropriate contact time as indicated for disinfecting solutions.

## 2020-05-14 LAB — COMPREHENSIVE METABOLIC PANEL
ALT: 13 U/L (ref 0–35)
AST: 14 U/L (ref 0–37)
Albumin: 4.3 g/dL (ref 3.5–5.2)
Alkaline Phosphatase: 64 U/L (ref 39–117)
BUN: 14 mg/dL (ref 6–23)
CO2: 28 mEq/L (ref 19–32)
Calcium: 9.4 mg/dL (ref 8.4–10.5)
Chloride: 101 mEq/L (ref 96–112)
Creatinine, Ser: 0.9 mg/dL (ref 0.40–1.20)
GFR: 73.39 mL/min (ref >60.00)
Glucose, Bld: 190 mg/dL — ABNORMAL HIGH (ref 70–99)
Potassium: 4.7 mEq/L (ref 3.5–5.1)
Sodium: 138 mEq/L (ref 135–145)
Total Bilirubin: 0.5 mg/dL (ref 0.2–1.2)
Total Protein: 7.3 g/dL (ref 6.0–8.3)

## 2020-05-14 LAB — LIPASE: Lipase: 35 U/L (ref 11.0–59.0)

## 2020-05-26 ENCOUNTER — Encounter: Payer: Self-pay | Admitting: Family

## 2020-05-26 ENCOUNTER — Other Ambulatory Visit: Payer: Self-pay | Admitting: Family

## 2020-05-26 MED ORDER — OZEMPIC (0.25 OR 0.5 MG/DOSE) 2 MG/1.5ML ~~LOC~~ SOPN: PEN_INJECTOR | SUBCUTANEOUS | 5 refills | Status: DC

## 2020-05-26 MED FILL — LISINOPRIL 10 MG TABS: 10 | 30 days supply | Qty: 30 | Fill #1

## 2020-05-26 MED FILL — SERTRALINE HCL 50 MG TABS: 50 | 90 days supply | Qty: 90 | Fill #0

## 2020-05-27 MED FILL — OZEMPIC 0.25 OR 0.5 MG/DOSE: 2 | 28 days supply | Qty: 2 | Fill #0

## 2020-07-04 ENCOUNTER — Other Ambulatory Visit (HOSPITAL_COMMUNITY): Payer: Self-pay

## 2020-07-04 MED FILL — Semaglutide Soln Pen-inj 0.25 or 0.5 MG/DOSE (2 MG/1.5ML): SUBCUTANEOUS | 30 days supply | Qty: 1.5 | Fill #0 | Status: AC

## 2020-07-05 ENCOUNTER — Other Ambulatory Visit (HOSPITAL_COMMUNITY): Payer: Self-pay

## 2020-07-24 ENCOUNTER — Other Ambulatory Visit (HOSPITAL_COMMUNITY): Payer: Self-pay

## 2020-07-24 ENCOUNTER — Encounter: Payer: Self-pay | Admitting: Family

## 2020-07-24 MED ORDER — NITROFURANTOIN MONOHYD MACRO 100 MG PO CAPS
100.0000 mg | ORAL_CAPSULE | Freq: Two times a day (BID) | ORAL | 0 refills | Status: AC
  Filled 2020-07-24: qty 10, 5d supply, fill #0

## 2020-08-06 ENCOUNTER — Other Ambulatory Visit (HOSPITAL_COMMUNITY): Payer: Self-pay

## 2020-08-06 MED FILL — Semaglutide Soln Pen-inj 0.25 or 0.5 MG/DOSE (2 MG/1.5ML): SUBCUTANEOUS | 28 days supply | Qty: 1.5 | Fill #1 | Status: AC

## 2020-10-15 MED FILL — Semaglutide Soln Pen-inj 0.25 or 0.5 MG/DOSE (2 MG/1.5ML): SUBCUTANEOUS | 28 days supply | Qty: 1.5 | Fill #2 | Status: AC

## 2020-10-16 ENCOUNTER — Other Ambulatory Visit (HOSPITAL_COMMUNITY): Payer: Self-pay

## 2021-01-21 ENCOUNTER — Ambulatory Visit (INDEPENDENT_AMBULATORY_CARE_PROVIDER_SITE_OTHER): Payer: No Typology Code available for payment source | Admitting: Family

## 2021-01-21 ENCOUNTER — Other Ambulatory Visit (HOSPITAL_COMMUNITY): Payer: Self-pay

## 2021-01-21 ENCOUNTER — Other Ambulatory Visit: Payer: Self-pay

## 2021-01-21 VITALS — BP 154/79 | HR 84 | Temp 98.2°F | Resp 16 | Wt 233.0 lb

## 2021-01-21 DIAGNOSIS — E785 Hyperlipidemia, unspecified: Secondary | ICD-10-CM | POA: Diagnosis not present

## 2021-01-21 DIAGNOSIS — E119 Type 2 diabetes mellitus without complications: Secondary | ICD-10-CM

## 2021-01-21 DIAGNOSIS — F418 Other specified anxiety disorders: Secondary | ICD-10-CM | POA: Diagnosis not present

## 2021-01-21 LAB — LIPID PANEL
Cholesterol: 231 mg/dL — ABNORMAL HIGH (ref 0–200)
HDL: 44.4 mg/dL (ref >39.00)
NonHDL: 186.44
Total CHOL/HDL Ratio: 5
Triglycerides: 245 mg/dL — ABNORMAL HIGH (ref 0.0–149.0)
VLDL: 49 mg/dL — ABNORMAL HIGH (ref 0.0–40.0)

## 2021-01-21 LAB — HEMOGLOBIN A1C: Hgb A1c MFr Bld: 9.9 % — ABNORMAL HIGH (ref 4.6–6.5)

## 2021-01-21 LAB — LDL CHOLESTEROL, DIRECT: Direct LDL: 168 mg/dL

## 2021-01-21 LAB — COMPREHENSIVE METABOLIC PANEL
ALT: 24 U/L (ref 0–35)
AST: 21 U/L (ref 0–37)
Albumin: 4.1 g/dL (ref 3.5–5.2)
Alkaline Phosphatase: 65 U/L (ref 39–117)
BUN: 13 mg/dL (ref 6–23)
CO2: 27 mEq/L (ref 19–32)
Calcium: 8.8 mg/dL (ref 8.4–10.5)
Chloride: 102 mEq/L (ref 96–112)
Creatinine, Ser: 0.61 mg/dL (ref 0.40–1.20)
GFR: 102.08 mL/min (ref >60.00)
Glucose, Bld: 338 mg/dL — ABNORMAL HIGH (ref 70–99)
Potassium: 4.5 mEq/L (ref 3.5–5.1)
Sodium: 136 mEq/L (ref 135–145)
Total Bilirubin: 0.6 mg/dL (ref 0.2–1.2)
Total Protein: 6.8 g/dL (ref 6.0–8.3)

## 2021-01-21 MED ORDER — BUPROPION HCL ER (SR) 150 MG PO TB12
150.0000 mg | ORAL_TABLET | Freq: Two times a day (BID) | ORAL | 1 refills | Status: DC
  Filled 2021-01-21: qty 60, 30d supply, fill #0

## 2021-01-21 MED FILL — Semaglutide Soln Pen-inj 0.25 or 0.5 MG/DOSE (2 MG/1.5ML): SUBCUTANEOUS | 28 days supply | Qty: 1.5 | Fill #3 | Status: AC

## 2021-01-21 NOTE — Assessment & Plan Note (Signed)
Lab Results  Component Value Date   CHOL 247 (H) 04/29/2020   HDL 50.70 04/29/2020   LDLCALC 157 (H) 04/29/2020   LDLDIRECT 201.4 02/06/2014   TRIG 199.0 (H) 04/29/2020   CHOLHDL 5 04/29/2020   Tolerating Livalo. Will obtain follow up lipid panel.

## 2021-01-21 NOTE — Assessment & Plan Note (Signed)
Uncontrolled. Restart ozempic at 0.5mg  weekly for 4 weeks, then increase to 1 mg weekly as tolerated.Continue work on diabetic diet.   Follow up in 1 month.

## 2021-01-21 NOTE — Assessment & Plan Note (Signed)
Depression is uncontrolled in the setting of grief reaction. Will rx with wellbutrin sr 150mg  twice daily for both depression and to help with smoking cessation.

## 2021-01-21 NOTE — Progress Notes (Signed)
Subjective:   By signing my name below, I, Shelley Garcia, attest that this documentation has been prepared under the direction and in the presence of Shelley Alar NP. 01/21/2021    Patient ID: Shelley Garcia, female    DOB: June 07, 1965, 55 y.o.   MRN: [id number]  Chief Complaint  Patient presents with   Diabetes    Here for follow up, sugar levels elevated at home up to 250.    Weight Gain    Complains of gaining weight   Grieving process    Lost granddaughter to cancer last month    HPI Patient is in today for a office visit.   Depression- She reports her granddaughter has recently passed away. She has familial support at this time but the loss is really weighing on her. She stopped taking 50 mg zoloft daily PO because it gave her lack of emotion. It did help her depression though.  She is willing to start something else find imporvement. She reports that she started smoking again. She is smoking 1 pack daily.  Blood sugar-Her blood sugars are elevated at home. They measure around 250 at home. She forgets to take ozempic while at home. She has not taken ozempic for at least 3 weeks.  She is interestd in the healthy weight and wellnes program.  GYN-She has not seen her GYN specialist in a while. She is due for a pap smear and is not intersted in getting it.  Colonoscopy- She is not intersted in scheduling a colonscopy.  Immunizations- She is UTD on flu vaccine.    Health Maintenance Due  Topic Date Due   HIV Screening  Never done   Hepatitis C Screening  Never done   PAP SMEAR-Modifier  Never done   COLONOSCOPY (Pts 45-69yr Insurance coverage will need to be confirmed)  Never done   Zoster Vaccines- Shingrix (1 of 2) Never done   OPHTHALMOLOGY EXAM  08/20/2018   Pneumococcal Vaccine [id number] old (2 - PCV) 09/15/2018   FOOT EXAM  12/20/2018   COVID-19 Vaccine (4 - Booster for Pfizer series) 05/22/2020   HEMOGLOBIN A1C  10/27/2020   MAMMOGRAM  11/13/2020    Past  Medical History:  Diagnosis Date   B12 deficiency 02/12/2015   Back pain    Cervical spine pain 09/01/2015   Depression with anxiety 07/29/2013   Diabetes mellitus    Diabetes mellitus type 2 with complications, uncontrolled ([place] 10/06/2010   Diagnosed with Type II DM in ~ 2008; pt does not tolerate Metformin (GI upset). Has been on Lantus since 2011. Vision assessment every 2 years- Optometry.    Dyspnea on exertion 10/12/2016   Elevated TSH 03/02/2017   Esophageal reflux 10/06/2010   Essential hypertension 11/23/2016   Gallbladder problem    GERD (gastroesophageal reflux disease)    History of chicken pox    History of migraine headaches 05/17/2012   Hyperlipidemia    Hypertension    Joint pain    Nonallopathic lesion of cervical region 11/18/2015   Nonallopathic lesion of lumbosacral region 11/18/2015   Nonallopathic lesion of thoracic region 11/18/2015   Obesity    Other fatigue 10/12/2016   Other hyperlipidemia 03/02/2017   Trapezius strain 02/12/2015   Type 2 diabetes mellitus without complication, without long-term current use of insulin ([place] 10/12/2016   Vitamin B12 deficiency    Vitamin D deficiency     Past Surgical History:  Procedure Laterality Date   CHOLECYSTECTOMY     EXPLORATORY LAPAROTOMY  KIDNEY STONE SURGERY     WISDOM TOOTH EXTRACTION  07/31/2013    Family History  Problem Relation Age of Onset   Hypertension Father    Diabetes Father    Lung cancer Father    Kidney failure Father    Hypertension Mother 13       Deceased   Diabetes Mother    Mental illness Mother        Schizophrenia   Stroke Mother    Bipolar disorder Mother    Depression Mother    Anxiety disorder Mother    Obesity Mother    Diabetes Sister    Stroke Sister    Mental illness Brother    Colon cancer Paternal Grandfather    Diabetes Other        Maternal/Paternal side   Hypertension Other        Maternal/Paternal side   Multiple sclerosis Maternal Aunt    Hypertension Sister     Healthy Son        x2    Social History   Socioeconomic History   Marital status: Married    Spouse name: Not on file   Number of children: 2   Years of education: Not on file   Highest education level: Not on file  Occupational History   Occupation: Nurse    Employer: [employer]  Tobacco Use   Smoking status: Former    Packs/day: 1.00    Types: Cigarettes    Start date: 06/11/2014   Smokeless tobacco: Never   Tobacco comments:    pt doing the vapor cigerette   Substance and Sexual Activity   Alcohol use: Yes    Alcohol/week: 0.0 standard drinks    Comment: rarely   Drug use: No   Sexual activity: Not on file  Other Topics Concern   Not on file  Social History Narrative   Works an Therapist, 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  Dx E11.8 1 each 0   glucose blood (FREESTYLE LITE) test strip Use as instructed to check blood sugar 2-3 times a day.  Dx  E11.8 100 each 5   Lancets (FREESTYLE) lancets Use to check blood sugar 2-3 times daily.  Dx E11.8 100 each 5   lisinopril (ZESTRIL) 10 MG tablet TAKE 1 TABLET BY MOUTH ONCE A DAY 30 tablet 3   pantoprazole (PROTONIX) 40 MG tablet TAKE 1 TABLET BY MOUTH ONCE A DAY * NEEDS APPT FOR REFILLS 90 tablet 1   Pitavastatin Calcium 4 MG TABS TAKE 1 TABLET BY MOUTH ONCE A DAY 90 tablet 1   Semaglutide,0.25 or 0.5MG/DOS, 2 MG/1.5ML SOPN INJECT 0.5 MG UNDER THE SKIN ONCE WEEKLY 1.5 mL 5   sertraline (ZOLOFT) 50 MG tablet TAKE 1 TABLET BY MOUTH  ONCE A DAY (Patient not taking: Reported on 01/21/2021) 90 tablet 0   No facility-administered medications prior to visit.    Allergies  Allergen Reactions  Metformin And Related Nausea And Vomiting    Made pt really sick   Codeine Rash    Review of Systems  Psychiatric/Behavioral:  Positive for depression.       Objective:    Physical Exam Constitutional:      General: She is not in acute distress.    Appearance: Normal appearance. She is not ill-appearing.  HENT:     Head: Normocephalic and atraumatic.     Right Ear: External ear normal.     Left Ear: External ear normal.  Eyes:     Extraocular Movements: Extraocular movements intact.     Pupils: Pupils are equal, round, and reactive to light.  Cardiovascular:     Rate and Rhythm: Normal rate and regular rhythm.     Heart sounds: Normal heart sounds. No murmur heard.   No gallop.  Pulmonary:     Effort: Pulmonary effort is normal. No respiratory distress.     Breath sounds: Normal breath sounds. No wheezing or rales.  Skin:    General: Skin is warm and dry.  Neurological:     Mental Status: She is alert and oriented to person, place, and time.  Psychiatric:        Mood and Affect: Affect is flat.        Behavior: Behavior normal.        Judgment: Judgment normal.    BP (!) 154/79 (BP Location: Right Arm, Patient Position: Sitting, Cuff Size: Large)   Pulse 84   Temp 98.2 F (36.8 C) (Oral)   Resp 16   Wt 233 lb (105.7 kg)   SpO2 98%   BMI 39.99 kg/m  Wt Readings from Last 3 Encounters:  01/21/21 233 lb (105.7 kg)  05/13/20 225 lb (102.1 kg)  04/29/20 223 lb 6.4 oz (101.3 kg)       Assessment & Plan:   Problem List Items Addressed This Visit       Unprioritized   Type 2 diabetes mellitus without complication, without long-term current use of insulin ([place]) - Primary    Uncontrolled. Restart ozempic at 0.$RemoveBefo'5mg'tpcICyDbjLb$  weekly for 4 weeks, then increase to 1 mg weekly as tolerated.Continue work on diabetic  diet.   Follow up in 1 month.       Relevant Orders   Hemoglobin A1c   Comp Met (CMET)   Hyperlipidemia    Lab Results  Component Value Date   CHOL 247 (H) 04/29/2020   HDL 50.70 04/29/2020   LDLCALC 157 (H) 04/29/2020   LDLDIRECT 201.4 02/06/2014   TRIG 199.0 (H) 04/29/2020   CHOLHDL 5 04/29/2020  Tolerating Livalo. Will obtain follow up lipid panel.       Relevant Orders   Lipid panel   Depression with anxiety    Depression is uncontrolled in the setting of grief reaction. Will rx with wellbutrin sr $RemoveBefor'150mg'[id number]$  twice daily for both depression and to help with smoking cessation.       Relevant Medications   buPROPion (WELLBUTRIN SR) 150 MG 12 hr tablet     Meds ordered this encounter  Medications   buPROPion (WELLBUTRIN SR) 150 MG 12 hr tablet    Sig: Take 1 tablet by mouth 2 times daily.    Dispense:  60 tablet    Refill:  1    Order Specific Question:   Supervising Provider    Answer:   Penni Homans A [[id number]]    I, Shelley Alar NP, personally preformed the services described in this documentation.  All medical record entries made by the scribe were at my direction and in my presence.  I have reviewed the chart and discharge instructions (if applicable) and agree that the record reflects my personal performance and is accurate and complete. 01/21/2021   I,Shelley Garcia,acting as a Education administrator for Nance Pear, NP.,have documented all relevant documentation on the behalf of Nance Pear, NP,as directed by  Nance Pear, NP while in the presence of Nance Pear, NP.   Nance Pear, NP

## 2021-02-16 ENCOUNTER — Encounter: Payer: Self-pay | Admitting: Family

## 2021-02-17 ENCOUNTER — Other Ambulatory Visit (HOSPITAL_COMMUNITY): Payer: Self-pay

## 2021-02-17 MED ORDER — SEMAGLUTIDE (1 MG/DOSE) 4 MG/3ML ~~LOC~~ SOPN
1.0000 mg | PEN_INJECTOR | SUBCUTANEOUS | 3 refills | Status: DC
  Filled 2021-02-17: qty 3, 28d supply, fill #0

## 2021-02-17 MED FILL — Pantoprazole Sodium EC Tab 40 MG (Base Equiv): ORAL | 90 days supply | Qty: 90 | Fill #0 | Status: AC

## 2021-02-24 ENCOUNTER — Encounter: Payer: No Typology Code available for payment source | Admitting: Family

## 2021-03-03 ENCOUNTER — Encounter: Payer: No Typology Code available for payment source | Admitting: Family

## 2021-03-10 ENCOUNTER — Other Ambulatory Visit (HOSPITAL_COMMUNITY): Payer: Self-pay

## 2021-03-10 ENCOUNTER — Ambulatory Visit: Payer: No Typology Code available for payment source | Attending: Internal Medicine

## 2021-03-10 ENCOUNTER — Other Ambulatory Visit: Payer: Self-pay | Admitting: Family

## 2021-03-10 ENCOUNTER — Telehealth: Payer: Self-pay | Admitting: Family

## 2021-03-10 ENCOUNTER — Encounter: Payer: Self-pay | Admitting: Family

## 2021-03-10 ENCOUNTER — Ambulatory Visit (INDEPENDENT_AMBULATORY_CARE_PROVIDER_SITE_OTHER): Payer: No Typology Code available for payment source | Admitting: Family

## 2021-03-10 VITALS — BP 136/77 | HR 89 | Temp 98.3°F | Resp 16 | Ht 64.0 in | Wt 226.8 lb

## 2021-03-10 DIAGNOSIS — K219 Gastro-esophageal reflux disease without esophagitis: Secondary | ICD-10-CM | POA: Diagnosis not present

## 2021-03-10 DIAGNOSIS — E119 Type 2 diabetes mellitus without complications: Secondary | ICD-10-CM | POA: Diagnosis not present

## 2021-03-10 DIAGNOSIS — Z1211 Encounter for screening for malignant neoplasm of colon: Secondary | ICD-10-CM

## 2021-03-10 DIAGNOSIS — Z23 Encounter for immunization: Secondary | ICD-10-CM

## 2021-03-10 DIAGNOSIS — Z0000 Encounter for general adult medical examination without abnormal findings: Secondary | ICD-10-CM | POA: Diagnosis not present

## 2021-03-10 DIAGNOSIS — Z87891 Personal history of nicotine dependence: Secondary | ICD-10-CM | POA: Insufficient documentation

## 2021-03-10 DIAGNOSIS — E1165 Type 2 diabetes mellitus with hyperglycemia: Secondary | ICD-10-CM | POA: Diagnosis not present

## 2021-03-10 DIAGNOSIS — F418 Other specified anxiety disorders: Secondary | ICD-10-CM | POA: Diagnosis not present

## 2021-03-10 DIAGNOSIS — Z720 Tobacco use: Secondary | ICD-10-CM

## 2021-03-10 MED ORDER — PANTOPRAZOLE SODIUM 40 MG PO TBEC
40.0000 mg | DELAYED_RELEASE_TABLET | Freq: Two times a day (BID) | ORAL | 3 refills | Status: DC
  Filled 2021-03-10: qty 60, 30d supply, fill #0

## 2021-03-10 MED ORDER — TIRZEPATIDE 2.5 MG/0.5ML ~~LOC~~ SOAJ
SUBCUTANEOUS | 5 refills | Status: DC
  Filled 2021-03-10: qty 2, 28d supply, fill #0
  Filled 2021-04-06: qty 2, 28d supply, fill #1

## 2021-03-10 NOTE — Progress Notes (Signed)
Subjective:   By signing my name below, I, Lyric Barr-McArthur, attest that this documentation has been prepared under the direction and in the presence of Debbrah Alar, NP, 03/10/2021    Patient ID: Shelley Garcia, female    DOB: 15-Sep-1965, 55 y.o.   MRN: [id number]  Chief Complaint  Patient presents with   Annual Exam    Here for Annual Exam     HPI Patient is in today for a comprehensive annual physical exam.   She denies any fever, unexpected weight change, adenopathy, rash, hearing loss, ear pain, rhinorrhea, visual disturbances, eye pain, chest pain, leg swelling, nausea, vomitting, diarrhea, blood in stool, dysuria, frequency, myalgias, arthralgias, headaches, or anxiety.  Ozempic: She has been using 1 mg ozempic weekly. She notes that it is helping lower her blood sugar levels. She denies any nausea and vomiting or other adverse side effects. She does note that her acid reflux has worsened since beginning the ozempic though. She is currently compliant with taking 40 mg protonix once daily to help with this.  Depression: She reports that she is still experiencing some days of intense depression. She is compliant in taking 150 mg Wellbutrin. She mentions that it has helped slightly but mainly wanted for it to aid in cessation of smoking. She reports it has not helped with her smoking at all.  Immunizations: She has already received her flu shot this season. She is UTD on tetanus vaccine and has had 3 Covid-19 vaccines at this time. She is interested in getting the bivalent Covid-19 booster vaccines after this visit today. She is interested in getting the Shingrix vaccine at her next visit. She is wanting to get a pneumonia booster in the office today.  Diet: She reports that her diet has improved but is not consistently healthy.  Exercise: She reports that she does not exercise.  Colonoscopy: No colonoscopy on record. Due. She is interested in doing the at home cologuard test  rather than a colonoscopy. Pap Smear: No pap smear has been recorded. Due. She is not interested in having a pap smear done at this time.  Mammogram: Her last mammogram in on file was in 11/14/2018 and a mass was examined in her left breast. She was recommended to have another scanned performed. Due.  Vision: She is UTD with vision care.  Tobacco: She currently is smoking between 1/2 to 3/4 of a pack of cigarettes everyday.  SHx: She denies any new surgeries in the last year.  FMHx: She reports no new family medical history.    Health Maintenance Due  Topic Date Due   HIV Screening  Never done   Hepatitis C Screening  Never done   COLONOSCOPY (Pts 45-15yrs Insurance coverage will need to be confirmed)  Never done   Zoster Vaccines- Shingrix (1 of 2) Never done   OPHTHALMOLOGY EXAM  08/20/2018   Pneumococcal Vaccine 61-74 Years old (2 - PCV) 09/15/2018   MAMMOGRAM  11/13/2020    Past Medical History:  Diagnosis Date   B12 deficiency 02/12/2015   Back pain    Cervical spine pain 09/01/2015   Depression with anxiety 07/29/2013   Diabetes mellitus    Diabetes mellitus type 2 with complications, uncontrolled 10/06/2010   Diagnosed with Type II DM in ~ 2008; pt does not tolerate Metformin (GI upset). Has been on Lantus since 2011. Vision assessment every 2 years- Optometry.    Dyspnea on exertion 10/12/2016   Elevated TSH 03/02/2017   Esophageal reflux  10/06/2010   Essential hypertension 11/23/2016   Gallbladder problem    GERD (gastroesophageal reflux disease)    History of chicken pox    History of migraine headaches 05/17/2012   Hyperlipidemia    Hypertension    Joint pain    Nonallopathic lesion of cervical region 11/18/2015   Nonallopathic lesion of lumbosacral region 11/18/2015   Nonallopathic lesion of thoracic region 11/18/2015   Obesity    Other fatigue 10/12/2016   Other hyperlipidemia 03/02/2017   Trapezius strain 02/12/2015   Type 2 diabetes mellitus without complication,  without long-term current use of insulin ([place]) 10/12/2016   Vitamin B12 deficiency    Vitamin D deficiency     Past Surgical History:  Procedure Laterality Date   CHOLECYSTECTOMY     EXPLORATORY LAPAROTOMY     KIDNEY STONE SURGERY     WISDOM TOOTH EXTRACTION  07/31/2013    Family History  Problem Relation Age of Onset   Hypertension Mother 68       Deceased   Diabetes Mother    Mental illness Mother        Schizophrenia   Stroke Mother    Bipolar disorder Mother    Depression Mother    Anxiety disorder Mother    Obesity Mother    Hypertension Father    Diabetes Father    Lung cancer Father    Kidney failure Father    Diabetes Sister    Stroke Sister    Hypertension Sister    Mental illness Brother    Colon cancer Paternal Grandfather    Healthy Son        x2   Multiple sclerosis Maternal Aunt    Diabetes Other        Maternal/Paternal side   Hypertension Other        Maternal/Paternal side   Brain cancer Granddaughter     Social History   Socioeconomic History   Marital status: Married    Spouse name: Not on file   Number of children: 2   Years of education: Not on file   Highest education level: Not on file  Occupational History   Occupation: Nurse    Employer: [employer]  Tobacco Use   Smoking status: Every Day    Packs/day: 0.75    Types: Cigarettes    Start date: 06/11/2014   Smokeless tobacco: Never   Tobacco comments:    pt doing the vapor cigerette   Substance and Sexual Activity   Alcohol use: Yes    Alcohol/week: 0.0 standard drinks    Comment: rarely   Drug use: No   Sexual activity: Not on file  Other Topics Concern   Not on file  Social History Narrative   Works an Therapist, 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  Dx E11.8 1 each 0   buPROPion (WELLBUTRIN SR) 150 MG 12 hr tablet Take 1 tablet by mouth 2 times  daily. 60 tablet 1   glucose blood (FREESTYLE LITE) test strip Use as instructed to check blood sugar 2-3 times a day.  Dx  E11.8 100 each 5   Lancets (FREESTYLE) lancets Use to check blood sugar 2-3 times daily.  Dx E11.8 100 each 5   lisinopril (ZESTRIL) 10 MG tablet TAKE 1 TABLET BY MOUTH ONCE A DAY 30 tablet 3   Pitavastatin Calcium 4 MG TABS TAKE 1 TABLET BY MOUTH ONCE A DAY 90 tablet 1   pantoprazole (PROTONIX) 40 MG tablet TAKE 1 TABLET BY MOUTH ONCE A DAY * NEEDS APPT FOR REFILLS 90 tablet 1   Semaglutide, 1 MG/DOSE, 4 MG/3ML SOPN Inject 1 mg as directed once a week. 3 mL 3   No facility-administered medications prior to visit.    Allergies  Allergen Reactions   Metformin And Related Nausea And Vomiting    Made pt really sick   Codeine Rash    Review of Systems  Constitutional:  Negative for fever.       (-) unexpected weight changes (-) adenopathy  HENT:  Negative for ear pain and hearing loss.        (-) rhinorrhea  Eyes:  Negative for pain.       (-) visual disturbances  Respiratory:  Positive for cough (lingering from previous illness).   Cardiovascular:  Negative for chest pain and leg swelling.  Gastrointestinal:  Positive for heartburn. Negative for blood in stool, diarrhea, nausea and vomiting.  Genitourinary:  Negative for dysuria and frequency.  Musculoskeletal:  Negative for joint pain and myalgias.  Skin:  Negative for rash.  Neurological:  Negative for headaches.  Psychiatric/Behavioral:  Positive for depression. The patient is not nervous/anxious.       Objective:    Physical Exam Exam  conducted with a chaperone present.  Constitutional:      General: She is not in acute distress.    Appearance: Normal appearance. She is not ill-appearing.  HENT:     Head: Normocephalic and atraumatic.     Right Ear: Tympanic membrane, ear canal and external ear normal.     Left Ear: Tympanic membrane, ear canal and external ear normal.  Eyes:     Extraocular Movements: Extraocular movements intact.     Pupils: Pupils are equal, round, and reactive to light.     Comments: (-) nystagmus  Cardiovascular:     Rate and Rhythm: Normal rate and regular rhythm.     Heart sounds: Normal heart sounds. No murmur heard.   No gallop.  Pulmonary:     Effort: Pulmonary effort is normal. No respiratory distress.     Breath sounds: Normal breath sounds. No wheezing or rales.  Chest:  Breasts:    Right: Normal.     Left: Normal.  Musculoskeletal:     Comments: (+) 5/5 upper and lower extremity strength  Lymphadenopathy:     Cervical: No cervical adenopathy.  Skin:    General: Skin is warm and dry.  Neurological:     Mental Status: She is alert and oriented to person, place, and time.     Deep Tendon Reflexes:     Reflex Scores:      Patellar reflexes are 1+ on the right side and 1+ on the left side. Psychiatric:        Behavior: Behavior normal.        Judgment: Judgment normal.    BP 136/77 (BP Location: Right Arm, Patient Position: Sitting, Cuff Size: Large)  Pulse 89   Temp 98.3 F (36.8 C) (Oral)   Resp 16   Ht 5\' 4"  (1.626 m)   Wt 226 lb 12.8 oz (102.9 kg)   SpO2 100%   BMI 38.93 kg/m  Wt Readings from Last 3 Encounters:  03/10/21 226 lb 12.8 oz (102.9 kg)  01/21/21 233 lb (105.7 kg)  05/13/20 225 lb (102.1 kg)       Assessment & Plan:   Problem List Items Addressed This Visit       Unprioritized   Uncontrolled type 2 diabetes mellitus with hyperglycemia ([place])    She prefers to switch to mounjaro from Ozempic to help more with weight loss.  Advised pt to d/c  ozempic, begin Mounjaro next week. Initial: 2.5 mg once weekly for 4 weeks, then increase to 5 mg once weekly.        Relevant Medications   tirzepatide [facility]) 2.5 MG/0.5ML Pen   Type 2 diabetes mellitus without complication, without long-term current use of insulin ([facility])   Relevant Medications   tirzepatide (MOUNJARO) 2.5 MG/0.5ML Pen   Tobacco abuse    Uncontrolled despite wellbutrin. Recommended that she add otc nicotine patch.       Preventative health care - Primary    Wt Readings from Last 3 Encounters:  03/10/21 226 lb 12.8 oz (102.9 kg)  01/21/21 233 lb (105.7 kg)  05/13/20 225 lb (102.1 kg)  Discussed healthy diet, exercise, weight loss.  Prevar 20 today. She plans to get covid bivalent booster downstairs today. Will begin shingrix next visit. Declines pap smear. Counseled on the importance of cervical cancer screening.       Relevant Orders   MM 3D SCREEN BREAST BILATERAL   HIV antibody (with reflex)   Hepatitis C Antibody   Esophageal reflux    Likely exacerbated by ozempic. Will increase protonix to bid.  Discussed GERD diet.       Relevant Medications   pantoprazole (PROTONIX) 40 MG tablet   Depression with anxiety    Not much improved. She continues to grieve the loss of her grandchild.       Other Visit Diagnoses     Colon cancer screening       Relevant Orders   Cologuard      Meds ordered this encounter  Medications   pantoprazole (PROTONIX) 40 MG tablet    Sig: Take 1 tablet (40 mg total) by mouth 2 (two) times daily.    Dispense:  60 tablet    Refill:  3    Order Specific Question:   Supervising Provider    Answer:   Penni Homans A [[id number]]   tirzepatide (MOUNJARO) 2.5 MG/0.5ML Pen    Sig: Inject 2.5 mg under the skin once weekly for 4 weeks, then increase to 5 mg once weekly.    Dispense:  2 mL    Refill:  5    Order Specific Question:   Supervising Provider    Answer:   Penni Homans A [[id number]]    I, Debbrah Alar, NP, personally  preformed the services described in this documentation.  All medical record entries made by the scribe were at my direction and in my presence.  I have reviewed the chart and discharge instructions (if applicable) and agree that the record reflects my personal performance and is accurate and complete. 03/10/2021  I,Lyric Barr-McArthur,acting as a scribe for Nance Pear, NP.,have documented all relevant documentation on the behalf of Nance Pear, NP,as directed by  Nance Pear, NP while in the presence of Nance Pear, NP.  Nance Pear, NP

## 2021-03-10 NOTE — Assessment & Plan Note (Signed)
Likely exacerbated by ozempic. Will increase protonix to bid.  Discussed GERD diet.

## 2021-03-10 NOTE — Assessment & Plan Note (Addendum)
Wt Readings from Last 3 Encounters:  03/10/21 226 lb 12.8 oz (102.9 kg)  01/21/21 233 lb (105.7 kg)  05/13/20 225 lb (102.1 kg)   Discussed healthy diet, exercise, weight loss.  Prevar 20 today. She plans to get covid bivalent booster downstairs today. Will begin shingrix next visit. Declines pap smear. Counseled on the importance of cervical cancer screening.

## 2021-03-10 NOTE — Assessment & Plan Note (Signed)
Uncontrolled despite wellbutrin. Recommended that she add otc nicotine patch.

## 2021-03-10 NOTE — Addendum Note (Signed)
Addended by: Jiles Prows on: 03/10/2021 05:23 PM   Modules accepted: Orders

## 2021-03-10 NOTE — Assessment & Plan Note (Signed)
Not much improved. She continues to grieve the loss of her grandchild.

## 2021-03-10 NOTE — Patient Instructions (Addendum)
Begin taking 40 mg Protonix twice daily.  Begin mounjaro on 03/18/2021, Initial: 2.5 mg once weekly for 4 weeks, then increase to 5 mg once weekly.  Continue taking 150 mg Wellbutrin twice daily.  Please check with insurance if Cologuard testing is approved, if so perform the test at home and mail back.  Please stop by downstairs and get the Covid-19 bivalent booster.

## 2021-03-10 NOTE — Progress Notes (Signed)
° °  Covid-19 Vaccination Clinic  Name:  Shelley Garcia    MRN: [id number] DOB: November 28, 1965  03/10/2021  [name] was observed post Covid-19 immunization for 15 minutes without incident. She was provided with Vaccine Information Sheet and instruction to access the V-Safe system.   [name] was instructed to call 911 with any severe reactions post vaccine: Difficulty breathing  Swelling of face and throat  A fast heartbeat  A bad rash all over body  Dizziness and weakness   Immunizations Administered     Name Date Dose VIS Date Route   Pfizer Covid-19 Vaccine Bivalent Booster 03/10/2021 10:48 AM 0.3 mL 11/26/2020 Intramuscular   Manufacturer: [place]   Lot: [id number]   [place]: [phone]

## 2021-03-10 NOTE — Assessment & Plan Note (Signed)
She prefers to switch to [facility] from Ozempic to help more with weight loss.  Advised pt to d/c ozempic, begin Mounjaro next week. Initial: 2.5 mg once weekly for 4 weeks, then increase to 5 mg once weekly.

## 2021-03-11 ENCOUNTER — Other Ambulatory Visit (HOSPITAL_BASED_OUTPATIENT_CLINIC_OR_DEPARTMENT_OTHER): Payer: Self-pay

## 2021-03-11 MED ORDER — PFIZER COVID-19 VAC BIVALENT 30 MCG/0.3ML IM SUSP
INTRAMUSCULAR | 0 refills | Status: DC
  Filled 2021-03-11: qty 0.3, 1d supply, fill #0

## 2021-03-11 NOTE — Telephone Encounter (Signed)
Opened in error

## 2021-03-12 ENCOUNTER — Other Ambulatory Visit (HOSPITAL_COMMUNITY): Payer: Self-pay

## 2021-03-18 ENCOUNTER — Telehealth (HOSPITAL_BASED_OUTPATIENT_CLINIC_OR_DEPARTMENT_OTHER): Payer: Self-pay

## 2021-04-06 ENCOUNTER — Other Ambulatory Visit (HOSPITAL_COMMUNITY): Payer: Self-pay

## 2021-04-07 ENCOUNTER — Other Ambulatory Visit: Payer: Self-pay

## 2021-04-07 ENCOUNTER — Other Ambulatory Visit (HOSPITAL_COMMUNITY): Payer: Self-pay

## 2021-04-07 MED ORDER — TIRZEPATIDE 5 MG/0.5ML ~~LOC~~ SOAJ
5.0000 mg | SUBCUTANEOUS | 2 refills | Status: DC
  Filled 2021-04-07: qty 2, 28d supply, fill #0

## 2021-04-07 NOTE — Progress Notes (Signed)
.  ter 

## 2021-05-03 ENCOUNTER — Encounter: Payer: Self-pay | Admitting: Family

## 2021-05-04 ENCOUNTER — Other Ambulatory Visit (HOSPITAL_COMMUNITY): Payer: Self-pay

## 2021-05-04 MED ORDER — TIRZEPATIDE 7.5 MG/0.5ML ~~LOC~~ SOAJ
7.5000 mg | SUBCUTANEOUS | 2 refills | Status: DC
  Filled 2021-05-04: qty 2, 28d supply, fill #0

## 2021-05-28 ENCOUNTER — Encounter: Payer: Self-pay | Admitting: Family

## 2021-05-28 ENCOUNTER — Other Ambulatory Visit (HOSPITAL_COMMUNITY): Payer: Self-pay

## 2021-05-28 MED ORDER — TIRZEPATIDE 10 MG/0.5ML ~~LOC~~ SOAJ
10.0000 mg | SUBCUTANEOUS | 0 refills | Status: DC
  Filled 2021-05-28: qty 6, 84d supply, fill #0

## 2021-05-28 NOTE — Addendum Note (Signed)
Addended by: Debbrah Alar on: 05/28/2021 03:06 PM ? ? Modules accepted: Orders ? ?

## 2021-06-08 ENCOUNTER — Telehealth: Payer: Self-pay | Admitting: Family

## 2021-06-08 NOTE — Telephone Encounter (Signed)
See mychart.  

## 2021-06-09 ENCOUNTER — Other Ambulatory Visit (HOSPITAL_BASED_OUTPATIENT_CLINIC_OR_DEPARTMENT_OTHER): Payer: Self-pay

## 2021-06-09 ENCOUNTER — Ambulatory Visit (INDEPENDENT_AMBULATORY_CARE_PROVIDER_SITE_OTHER): Payer: No Typology Code available for payment source | Admitting: Family

## 2021-06-09 DIAGNOSIS — E7849 Other hyperlipidemia: Secondary | ICD-10-CM

## 2021-06-09 DIAGNOSIS — E785 Hyperlipidemia, unspecified: Secondary | ICD-10-CM

## 2021-06-09 DIAGNOSIS — I10 Essential (primary) hypertension: Secondary | ICD-10-CM

## 2021-06-09 DIAGNOSIS — Z720 Tobacco use: Secondary | ICD-10-CM

## 2021-06-09 DIAGNOSIS — E1165 Type 2 diabetes mellitus with hyperglycemia: Secondary | ICD-10-CM | POA: Diagnosis not present

## 2021-06-09 DIAGNOSIS — K219 Gastro-esophageal reflux disease without esophagitis: Secondary | ICD-10-CM

## 2021-06-09 LAB — BASIC METABOLIC PANEL
BUN: 15 mg/dL (ref 6–23)
CO2: 25 mEq/L (ref 19–32)
Calcium: 8.7 mg/dL (ref 8.4–10.5)
Chloride: 104 mEq/L (ref 96–112)
Creatinine, Ser: 0.72 mg/dL (ref 0.40–1.20)
GFR: 95.21 mL/min (ref >60.00)
Glucose, Bld: 130 mg/dL — ABNORMAL HIGH (ref 70–99)
Potassium: 4.2 mEq/L (ref 3.5–5.1)
Sodium: 137 mEq/L (ref 135–145)

## 2021-06-09 LAB — HEMOGLOBIN A1C: Hgb A1c MFr Bld: 6.7 % — ABNORMAL HIGH (ref 4.6–6.5)

## 2021-06-09 MED ORDER — OMEPRAZOLE 40 MG PO CPDR
40.0000 mg | DELAYED_RELEASE_CAPSULE | Freq: Every day | ORAL | 1 refills | Status: DC
  Filled 2021-06-09: qty 90, 90d supply, fill #0

## 2021-06-09 MED ORDER — VARENICLINE TARTRATE 0.5 MG X 11 & 1 MG X 42 PO TBPK
ORAL_TABLET | ORAL | 0 refills | Status: DC
  Filled 2021-06-09: qty 53, 28d supply, fill #0

## 2021-06-09 NOTE — Assessment & Plan Note (Addendum)
Elevated last visit. She is non-fasting and wishes to check lipids next visit. Continue pitavastatin.  ?

## 2021-06-09 NOTE — Assessment & Plan Note (Signed)
Trial of chantix. Common side effects including rare risk of worsening depression was discussed with the patient today.  Patient is instructed to go directly to notify us if this occurs.  We discussed that patient can continue to smoke for 1 week after starting chantix, but then must discontinue cigarettes. Pt  is also instructed to contact us prior to completion of the starter month pack for an rx for the continuation month pack.  ?5 minutes spent with patient today on tobacco cessation counseling.  ? ?

## 2021-06-09 NOTE — Patient Instructions (Addendum)
Please complete lab work prior to leaving. ?Start Chantix. ?Continue Mounjaro. ? ? ? ?

## 2021-06-09 NOTE — Progress Notes (Signed)
? ?Subjective:  ? ?By signing my name below, I, [name], attest that this documentation has been prepared under the direction and in the presence of Debbrah Alar NP, 06/09/2021 ? ? Patient ID: Shelley Garcia, female    DOB: [birth year], 56 y.o.   MRN: [id number] ? ?Chief Complaint  ?Patient presents with  ? Diabetes  ?  Here for follow up  ? Gastroesophageal Reflux  ?  Using otc omeprazole, "works better than protonix"  ? ? ?HPI ?Patient is in today for an office visit.  ? ?Blood Sugar - Her blood sugar levels have been improving. ?Lab Results  ?Component Value Date  ? HGBA1C 9.9 (H) 01/21/2021  ? ?Weight - She reports that she has lost weight. She has noted that her appetite has been suppressed.  ?Wt Readings from Last 3 Encounters:  ?06/09/21 218 lb (98.9 kg)  ?03/10/21 226 lb 12.8 oz (102.9 kg)  ?01/21/21 233 lb (105.7 kg)  ? ?Smoking - Patient requests generic Chantix to stop smoking. ? ?Omeprazole - Patient requests a prescription of Omeprazole. Started OTC in place of protonix and it has been much more effective. ? ?Health Maintenance Due  ?Topic Date Due  ? HIV Screening  Never done  ? Hepatitis C Screening  Never done  ? PAP SMEAR-Modifier  Never done  ? COLONOSCOPY (Pts 45-32yr Insurance coverage will need to be confirmed)  Never done  ? Zoster Vaccines- Shingrix (1 of 2) Never done  ? URINE MICROALBUMIN  03/02/2018  ? OPHTHALMOLOGY EXAM  08/20/2018  ? MAMMOGRAM  11/13/2020  ? ? ?Past Medical History:  ?Diagnosis Date  ? B12 deficiency 02/12/2015  ? Back pain   ? Cervical spine pain 09/01/2015  ? Depression with anxiety 07/29/2013  ? Diabetes mellitus   ? Diabetes mellitus type 2 with complications, uncontrolled 10/06/2010  ? Diagnosed with Type II DM in ~ 2008; pt does not tolerate Metformin (GI upset). Has been on Lantus since 2011. Vision assessment every 2 years- Optometry.   ? Dyspnea on exertion 10/12/2016  ? Elevated TSH 03/02/2017  ? Esophageal reflux 10/06/2010  ? Essential hypertension  11/23/2016  ? Gallbladder problem   ? GERD (gastroesophageal reflux disease)   ? History of chicken pox   ? History of migraine headaches 05/17/2012  ? Hyperlipidemia   ? Hypertension   ? Joint pain   ? Nonallopathic lesion of cervical region 11/18/2015  ? Nonallopathic lesion of lumbosacral region 11/18/2015  ? Nonallopathic lesion of thoracic region 11/18/2015  ? Obesity   ? Other fatigue 10/12/2016  ? Other hyperlipidemia 03/02/2017  ? Trapezius strain 02/12/2015  ? Type 2 diabetes mellitus without complication, without long-term current use of insulin ([place] 10/12/2016  ? Vitamin B12 deficiency   ? Vitamin D deficiency   ? ? ?Past Surgical History:  ?Procedure Laterality Date  ? CHOLECYSTECTOMY    ? EXPLORATORY LAPAROTOMY    ? KIDNEY STONE SURGERY    ? WISDOM TOOTH EXTRACTION  07/31/2013  ? ? ?Family History  ?Problem Relation Age of Onset  ? Hypertension Mother 692 ?     Deceased  ? Diabetes Mother   ? Mental illness Mother   ?     Schizophrenia  ? Stroke Mother   ? Bipolar disorder Mother   ? Depression Mother   ? Anxiety disorder Mother   ? Obesity Mother   ? Hypertension Father   ? Diabetes Father   ? Lung cancer Father   ? Kidney failure  Father   ? Diabetes Sister   ? Stroke Sister   ? Hypertension Sister   ? Mental illness Brother   ? Colon cancer Paternal Grandfather   ? Healthy Son   ?     x2  ? Multiple sclerosis Maternal Aunt   ? Diabetes Other   ?     Maternal/Paternal side  ? Hypertension Other   ?     Maternal/Paternal side  ? Brain cancer Granddaughter   ? ? ?Social History  ? ?Socioeconomic History  ? Marital status: Married  ?  Spouse name: Not on file  ? Number of children: 2  ? Years of education: Not on file  ? Highest education level: Not on file  ?Occupational History  ? Occupation: Nurse  ?  Employer: [employer]  ?Tobacco Use  ? Smoking status: Every Day  ?  Packs/day: 0.75  ?  Types: Cigarettes  ?  Start date: 06/11/2014  ? Smokeless tobacco: Never  ? Tobacco comments:  ?  pt doing the vapor  cigerette   ?Substance and Sexual Activity  ? Alcohol use: Yes  ?  Alcohol/week: 0.0 standard drinks  ?  Comment: rarely  ? Drug use: No  ? Sexual activity: Not on file  ?Other Topics Concern  ? Not on file  ?Social History Narrative  ? 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 Married  ? 2 grown children (2 grandchildren)  ? Enjoys reading  ? ?Social Determinants of Health  ? ?Financial Resource Strain: Not on file  ?Food Insecurity: Not on file  ?Transportation Needs: Not on file  ?Physical Activity: Not on file  ?Stress: Not on file  ?Social Connections: Not on file  ?Intimate Partner Violence: Not on file  ? ? ?Outpatient Medications Prior to Visit  ?Medication Sig Dispense Refill  ? Blood Glucose Monitoring Suppl (FREESTYLE LITE) DEVI Use as instructed 2 to 3 times daily to check blood sugar.  Dx E11.8 1 each 0  ? buPROPion (WELLBUTRIN SR) 150 MG 12 hr tablet Take 1 tablet by mouth 2 times daily. 60 tablet 1  ? COVID-19 mRNA bivalent vaccine, Pfizer, (PFIZER COVID-19 VAC BIVALENT) injection Inject into the muscle. 0.3 mL 0  ? glucose blood (FREESTYLE LITE) test strip Use as instructed to check blood sugar 2-3 times a day.  Dx  E11.8 100 each 5  ? Lancets (FREESTYLE) lancets Use to check blood sugar 2-3 times daily.  Dx E11.8 100 each 5  ? tirzepatide (MOUNJARO) 10 MG/0.5ML Pen Inject 1 pen (10 mg) into the skin once a week. 6 mL 0  ? lisinopril (ZESTRIL) 10 MG tablet TAKE 1 TABLET BY MOUTH ONCE A DAY 30 tablet 3  ? Pitavastatin Calcium 4 MG TABS TAKE 1 TABLET BY MOUTH ONCE A DAY 90 tablet 1  ? pantoprazole (PROTONIX) 40 MG tablet Take 1 tablet by mouth 2 times daily. 60 tablet 3  ? ?No facility-administered medications prior to visit.  ? ? ?Allergies  ?Allergen Reactions  ? Metformin And Related Nausea And Vomiting  ?  Made pt really sick  ? Codeine Rash  ? ? ?ROS ?See HPI ?   ?Objective:  ?  ?Physical Exam ?Constitutional:   ?   General: She is not in acute distress. ?   Appearance: Normal appearance. She is not  ill-appearing.  ?HENT:  ?   Head: Normocephalic and atraumatic.  ?   Right Ear: External ear normal.  ?   Left  Ear: External ear normal.  ?Eyes:  ?   Extraocular Movements: Extraocular movements intact.  ?   Pupils: Pupils are equal, round, and reactive to light.  ?Cardiovascular:  ?   Rate and Rhythm: Normal rate and regular rhythm.  ?   Heart sounds: Normal heart sounds. No murmur heard. ?  No gallop.  ?Pulmonary:  ?   Effort: Pulmonary effort is normal. No respiratory distress.  ?   Breath sounds: Normal breath sounds. No wheezing or rales.  ?Skin: ?   General: Skin is warm and dry.  ?Neurological:  ?   Mental Status: She is alert and oriented to person, place, and time.  ?Psychiatric:     ?   Judgment: Judgment normal.  ? ? ?BP (!) 143/62 (BP Location: Right Arm, Patient Position: Sitting, Cuff Size: Large)   Pulse 82   Temp 98.2 ?F (36.8 ?C) (Oral)   Resp 16   Wt 218 lb (98.9 kg)   SpO2 100%   BMI 37.42 kg/m?  ?Wt Readings from Last 3 Encounters:  ?06/09/21 218 lb (98.9 kg)  ?03/10/21 226 lb 12.8 oz (102.9 kg)  ?01/21/21 233 lb (105.7 kg)  ? ? ?   ?Assessment & Plan:  ? ?Problem List Items Addressed This Visit   ? ?  ? Unprioritized  ? Uncontrolled type 2 diabetes mellitus with hyperglycemia ([place])  ?  Wt Readings from Last 3 Encounters:  ?06/09/21 218 lb (98.9 kg)  ?03/10/21 226 lb 12.8 oz (102.9 kg)  ?01/21/21 233 lb (105.7 kg)  ? ?Lab Results  ?Component Value Date  ? HGBA1C 9.9 (H) 01/21/2021  ? HGBA1C 8.4 (H) 04/29/2020  ? HGBA1C 5.9 11/07/2018  ? ?Lab Results  ?Component Value Date  ? MICROALBUR 0.4 05/21/2016  ? LDLCALC 157 (H) 04/29/2020  ? CREATININE 0.61 01/21/2021  ?Notes improvement in her sugars since we increased her Mounjaro dose.  She has also managed to lose some weight. Continue mounjaro.  ?  ?  ? Relevant Orders  ? Hemoglobin A1c  ? Basic metabolic panel  ? Tobacco abuse  ?  Trial of chantix. Common side effects including rare risk of worsening depression was discussed with the patient  today.  Patient is instructed to go directly to notify us if this occurs.  We discussed that patient can continue to smoke for 1 week after starting chantix, but then must discontinue cigarettes. Pt  is also instructed to co

## 2021-06-09 NOTE — Assessment & Plan Note (Addendum)
Wt Readings from Last 3 Encounters:  ?06/09/21 218 lb (98.9 kg)  ?03/10/21 226 lb 12.8 oz (102.9 kg)  ?01/21/21 233 lb (105.7 kg)  ? ?Lab Results  ?Component Value Date  ? HGBA1C 9.9 (H) 01/21/2021  ? HGBA1C 8.4 (H) 04/29/2020  ? HGBA1C 5.9 11/07/2018  ? ?Lab Results  ?Component Value Date  ? MICROALBUR 0.4 05/21/2016  ? LDLCALC 157 (H) 04/29/2020  ? CREATININE 0.61 01/21/2021  ? ?Notes improvement in her sugars since we increased her Mounjaro dose.  She has also managed to lose some weight. Continue mounjaro.  ?

## 2021-06-09 NOTE — Assessment & Plan Note (Addendum)
BP Readings from Last 3 Encounters:  ?06/09/21 (!) 143/62  ?03/10/21 136/77  ?01/21/21 (!) 154/79  ? ?On lisinopril '10mg'$ .  BP slightly above goal. Will plan to recheck in 1 month and if still above goal next month will increase lisinopril.  ?

## 2021-06-09 NOTE — Assessment & Plan Note (Signed)
Lab Results  ?Component Value Date  ? CHOL 231 (H) 01/21/2021  ? HDL 44.40 01/21/2021  ? LDLCALC 157 (H) 04/29/2020  ? LDLDIRECT 168.0 01/21/2021  ? TRIG 245.0 (H) 01/21/2021  ? CHOLHDL 5 01/21/2021  ? ? ?

## 2021-06-09 NOTE — Assessment & Plan Note (Signed)
Improved on omeprazole. New rx sent for omeprazole rx in place of protonix. D/c protonix.  ?

## 2021-07-08 ENCOUNTER — Ambulatory Visit: Payer: No Typology Code available for payment source | Admitting: Family

## 2021-08-07 ENCOUNTER — Encounter: Payer: Self-pay | Admitting: Family

## 2021-08-07 MED ORDER — LIDOCAINE VISCOUS HCL 2 % MT SOLN: 10.0000 mL | OROMUCOSAL | 0 refills | Status: DC | PRN

## 2021-08-07 MED ORDER — AMOXICILLIN 500 MG PO CAPS: 500.0000 mg | ORAL_CAPSULE | Freq: Three times a day (TID) | ORAL | 0 refills | Status: AC

## 2021-08-18 ENCOUNTER — Encounter: Payer: Self-pay | Admitting: Family

## 2021-08-25 ENCOUNTER — Encounter: Payer: Self-pay | Admitting: Family

## 2021-08-25 ENCOUNTER — Telehealth: Payer: Self-pay

## 2021-08-25 ENCOUNTER — Other Ambulatory Visit (HOSPITAL_BASED_OUTPATIENT_CLINIC_OR_DEPARTMENT_OTHER): Payer: Self-pay

## 2021-08-25 ENCOUNTER — Ambulatory Visit (INDEPENDENT_AMBULATORY_CARE_PROVIDER_SITE_OTHER): Payer: No Typology Code available for payment source | Admitting: Family

## 2021-08-25 VITALS — BP 146/61 | HR 63 | Temp 98.1°F | Resp 16 | Wt 212.0 lb

## 2021-08-25 DIAGNOSIS — E785 Hyperlipidemia, unspecified: Secondary | ICD-10-CM

## 2021-08-25 DIAGNOSIS — I10 Essential (primary) hypertension: Secondary | ICD-10-CM

## 2021-08-25 DIAGNOSIS — E6609 Other obesity due to excess calories: Secondary | ICD-10-CM

## 2021-08-25 DIAGNOSIS — Z6837 Body mass index (BMI) 37.0-37.9, adult: Secondary | ICD-10-CM

## 2021-08-25 DIAGNOSIS — Z87891 Personal history of nicotine dependence: Secondary | ICD-10-CM | POA: Diagnosis not present

## 2021-08-25 DIAGNOSIS — E119 Type 2 diabetes mellitus without complications: Secondary | ICD-10-CM

## 2021-08-25 DIAGNOSIS — E66812 Obesity, class 2: Secondary | ICD-10-CM

## 2021-08-25 MED ORDER — TIRZEPATIDE 12.5 MG/0.5ML ~~LOC~~ SOAJ
12.5000 mg | SUBCUTANEOUS | 1 refills | Status: DC
  Filled 2021-08-25: qty 6, 84d supply, fill #0
  Filled 2021-11-11: qty 2, 28d supply, fill #1

## 2021-08-25 MED ORDER — LISINOPRIL 20 MG PO TABS
20.0000 mg | ORAL_TABLET | Freq: Every day | ORAL | 1 refills | Status: DC
  Filled 2021-08-25: qty 90, 90d supply, fill #0

## 2021-08-25 NOTE — Telephone Encounter (Signed)
PA initiated for mounjaro 12.5

## 2021-08-25 NOTE — Patient Instructions (Signed)
Please increase your lisinopril from 10 mg to 20 mg.

## 2021-08-25 NOTE — Assessment & Plan Note (Addendum)
Wt Readings from Last 3 Encounters:  08/25/21 212 lb (96.2 kg)  06/09/21 218 lb (98.9 kg)  03/10/21 226 lb 12.8 oz (102.9 kg)   She continues to lose weight on Mounjaro but notes that she is noticing an increased appetite. Will increase Mounjaro from '10mg'$  to 12.'5mg'$  once weekly.

## 2021-08-25 NOTE — Assessment & Plan Note (Signed)
Lab Results  Component Value Date   HGBA1C 6.7 (H) 06/09/2021   A1C came down considerably with mounjaro. Continue Mounjaro.

## 2021-08-25 NOTE — Assessment & Plan Note (Signed)
I commended pt on quitting smoking.

## 2021-08-25 NOTE — Assessment & Plan Note (Signed)
Uncontrolled.  Increase lisinopril from 10 mg to 20 mg.

## 2021-08-25 NOTE — Progress Notes (Signed)
Subjective:   By signing my name below, I, [name], attest that this documentation has been prepared under the direction and in the presence of Karie Chimera, NP 08/25/2021     Patient ID: Shelley Garcia, female    DOB: 10/25/1965, 56 y.o.   MRN: [id number]  Chief Complaint  Patient presents with   Diabetes    Here for follow up    HPI Patient is in today for an office visit.  Weight - As of today's visit, her weight is decreasing. She is currently taking 10 Mg of Mounjaro daily. She is interested in increasing her dose of Mounjaro.  Wt Readings from Last 3 Encounters:  08/25/21 212 lb (96.2 kg)  06/09/21 218 lb (98.9 kg)  03/10/21 226 lb 12.8 oz (102.9 kg)   Blood Sugar - Her A1C levels are good.  Lab Results  Component Value Date   HGBA1C 6.7 (H) 06/09/2021   Blood Pressure - As of today's visit, her blood pressure levels are elevating. She is currently taking 10 Mg of Lisinopril. BP Readings from Last 3 Encounters:  08/25/21 (!) 146/61  06/09/21 (!) 143/62  03/10/21 136/77   Pulse Readings from Last 3 Encounters:  08/25/21 63  06/09/21 82  03/10/21 89   Smoking - She has quit smoking. She is off of Chantix.   Health Maintenance Due  Topic Date Due   HIV Screening  Never done   Hepatitis C Screening  Never done   PAP SMEAR-Modifier  Never done   COLONOSCOPY (Pts 45-39yr Insurance coverage will need to be confirmed)  Never done   Zoster Vaccines- Shingrix (1 of 2) Never done   URINE MICROALBUMIN  03/02/2018   OPHTHALMOLOGY EXAM  08/20/2018   MAMMOGRAM  11/13/2020    Past Medical History:  Diagnosis Date   B12 deficiency 02/12/2015   Back pain    Cervical spine pain 09/01/2015   Depression with anxiety 07/29/2013   Diabetes mellitus    Diabetes mellitus type 2 with complications, uncontrolled 10/06/2010   Diagnosed with Type II DM in ~ 2008; pt does not tolerate Metformin (GI upset). Has been on Lantus since 2011. Vision assessment every 2  years- Optometry.    Dyspnea on exertion 10/12/2016   Elevated TSH 03/02/2017   Esophageal reflux 10/06/2010   Essential hypertension 11/23/2016   Gallbladder problem    GERD (gastroesophageal reflux disease)    History of chicken pox    History of migraine headaches 05/17/2012   Hyperlipidemia    Hypertension    Joint pain    Nonallopathic lesion of cervical region 11/18/2015   Nonallopathic lesion of lumbosacral region 11/18/2015   Nonallopathic lesion of thoracic region 11/18/2015   Obesity    Other fatigue 10/12/2016   Other hyperlipidemia 03/02/2017   Trapezius strain 02/12/2015   Type 2 diabetes mellitus without complication, without long-term current use of insulin ([place] 10/12/2016   Vitamin B12 deficiency    Vitamin D deficiency     Past Surgical History:  Procedure Laterality Date   CHOLECYSTECTOMY     EXPLORATORY LAPAROTOMY     KIDNEY STONE SURGERY     WISDOM TOOTH EXTRACTION  07/31/2013    Family History  Problem Relation Age of Onset   Hypertension Mother 670      Deceased   Diabetes Mother    Mental illness Mother        Schizophrenia   Stroke Mother    Bipolar disorder Mother  Depression Mother    Anxiety disorder Mother    Obesity Mother    Hypertension Father    Diabetes Father    Lung cancer Father    Kidney failure Father    Diabetes Sister    Stroke Sister    Hypertension Sister    Mental illness Brother    Colon cancer Paternal Grandfather    Healthy Son        x2   Multiple sclerosis Maternal Aunt    Diabetes Other        Maternal/Paternal side   Hypertension Other        Maternal/Paternal side   Brain cancer Granddaughter     Social History   Socioeconomic History   Marital status: Married    Spouse name: Not on file   Number of children: 2   Years of education: Not on file   Highest education level: Not on file  Occupational History   Occupation: Nurse    Employer: [employer]  Tobacco Use   Smoking status: Former    Packs/day:  0.75    Types: Cigarettes    Start date: 06/11/2014    Quit date: 06/17/2021    Years since quitting: 0.1   Smokeless tobacco: Never   Tobacco comments:    pt doing the vapor cigerette   Substance and Sexual Activity   Alcohol use: Yes    Alcohol/week: 0.0 standard drinks    Comment: rarely   Drug use: No   Sexual activity: Not on file  Other Topics Concern   Not on file  Social History Narrative   Works an Therapist, 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  Dx E11.8 1 each 0   COVID-19 mRNA bivalent vaccine, Pfizer, (PFIZER COVID-19 VAC BIVALENT) injection Inject into the muscle. 0.3 mL 0   glucose blood (FREESTYLE LITE) test strip Use as instructed to check blood sugar 2-3 times a day.  Dx  E11.8 100 each 5   Lancets (FREESTYLE) lancets Use to check blood sugar 2-3 times daily.  Dx E11.8 100 each 5   omeprazole (PRILOSEC) 40 MG capsule Take 1 capsule (40 mg total) by mouth daily. 90 capsule 1   tirzepatide (MOUNJARO) 10 MG/0.5ML Pen Inject 1 pen (10 mg) into the skin once a week. 6 mL 0   Pitavastatin Calcium 4 MG TABS TAKE 1 TABLET BY MOUTH ONCE A DAY 90 tablet 1   buPROPion (WELLBUTRIN SR) 150 MG 12 hr tablet Take 1 tablet by mouth 2 times daily. 60 tablet 1   lidocaine (XYLOCAINE) 2 % solution Use as directed 10 mLs in the mouth or throat every 4 (four) hours as needed for mouth pain. 200 mL 0   lisinopril (ZESTRIL) 10 MG tablet TAKE 1 TABLET BY MOUTH ONCE A DAY 30 tablet 3   varenicline  (CHANTIX PAK) 0.5 MG X 11 & 1 MG X 42 tablet Take one 0.5 mg tablet  by mouth once daily for 3 days, then increase to one 0.5 mg tablet twice daily for 4 days, then increase to one 1 mg tablet twice daily. 53 tablet 0   No facility-administered medications prior to visit.    Allergies  Allergen Reactions   Metformin And Related Nausea And Vomiting    Made pt really sick   Codeine Rash    ROS See HPI    Objective:    Physical Exam Constitutional:      Appearance: Normal appearance.  HENT:     Head: Normocephalic and atraumatic.     Right Ear: External ear normal.     Left Ear: External ear normal.  Eyes:     Extraocular Movements: Extraocular movements intact.     Pupils: Pupils are equal, round, and reactive to light.  Cardiovascular:     Rate and Rhythm: Normal rate and regular rhythm.     Heart sounds: Normal heart sounds. No murmur heard.   No gallop.  Pulmonary:     Effort: Pulmonary effort is normal. No respiratory distress.     Breath sounds: Normal breath sounds. No wheezing or rales.  Skin:    General: Skin is warm and dry.  Neurological:     Mental Status: She is alert and oriented to person, place, and time.  Psychiatric:        Mood and Affect: Mood normal.        Behavior: Behavior normal.        Judgment: Judgment normal.    BP (!) 146/61 (BP Location: Right Arm, Patient Position: Sitting, Cuff Size: Large)   Pulse 63   Temp 98.1 F (36.7 C) (Oral)   Resp 16   Wt 212 lb (96.2 kg)   SpO2 98%   BMI 36.39 kg/m  Wt Readings from Last 3 Encounters:  08/25/21 212 lb (96.2 kg)  06/09/21 218 lb (98.9 kg)  03/10/21 226 lb 12.8 oz (102.9 kg)       Assessment & Plan:   Problem List Items Addressed This Visit       Unprioritized   Type 2 diabetes mellitus without complication, without long-term current use of insulin (HCC)    Lab Results  Component Value Date   HGBA1C 6.7 (H) 06/09/2021  A1C came down considerably with mounjaro. Continue Mounjaro.        Relevant Medications   tirzepatide (MOUNJARO) 12.5 MG/0.5ML Pen   lisinopril (ZESTRIL) 20 MG tablet   Primary hypertension    Uncontrolled.  Increase lisinopril from 10 mg to 20 mg.        Relevant Medications   lisinopril (ZESTRIL) 20 MG tablet   Hyperlipidemia - Primary    Repeat lipid panel next visit.        Relevant Medications   lisinopril (ZESTRIL) 20 MG tablet   History of tobacco abuse    I commended pt on quitting smoking.         Class 2 obesity due to excess calories without serious comorbidity with body mass index (BMI) of 37.0 to 37.9 in adult    Wt Readings from Last 3 Encounters:  08/25/21 212 lb (96.2 kg)  06/09/21 218 lb (98.9 kg)  03/10/21 226 lb 12.8 oz (102.9 kg)  She continues to lose weight on Mounjaro but notes that she is noticing an increased appetite. Will increase Mounjaro from '10mg'$  to 12.'5mg'$  once weekly.        Relevant Medications   tirzepatide (MOUNJARO) 12.5 MG/0.5ML Pen  Meds ordered this encounter  Medications   tirzepatide (MOUNJARO) 12.5 MG/0.5ML Pen    Sig: Inject 12.5 mg into the skin once a week.    Dispense:  6 mL    Refill:  1    Order Specific Question:   Supervising Provider    Answer:   Penni Homans A [[id number]]   lisinopril (ZESTRIL) 20 MG tablet    Sig: Take 1 tablet (20 mg total) by mouth daily.    Dispense:  90 tablet    Refill:  1    Order Specific Question:   Supervising Provider    Answer:   Penni Homans A [[id number]]    I, Nance Pear, NP, personally preformed the services described in this documentation.  All medical record entries made by the scribe were at my direction and in my presence.  I have reviewed the chart and discharge instructions (if applicable) and agree that the record reflects my personal performance and is accurate and complete. 08/25/2021   I,Amber Collins,acting as a scribe for Nance Pear, NP.,have documented all relevant documentation on the behalf of Nance Pear, NP,as directed by  Nance Pear, NP while in the presence of Nance Pear, NP.    Nance Pear, NP

## 2021-08-25 NOTE — Assessment & Plan Note (Signed)
Repeat lipid panel next visit.  

## 2021-08-26 ENCOUNTER — Other Ambulatory Visit (HOSPITAL_BASED_OUTPATIENT_CLINIC_OR_DEPARTMENT_OTHER): Payer: Self-pay

## 2021-08-26 NOTE — Telephone Encounter (Signed)
Patient would like to know if she can go get her Mounjaro medication or still wait. Please advise.

## 2021-08-26 NOTE — Telephone Encounter (Signed)
Called patient but no answer, left detail message for her to be aware we have not receive a determination for prior authorization submitted yesterday.

## 2021-08-27 ENCOUNTER — Other Ambulatory Visit (HOSPITAL_BASED_OUTPATIENT_CLINIC_OR_DEPARTMENT_OTHER): Payer: Self-pay

## 2021-08-27 ENCOUNTER — Encounter: Payer: Self-pay | Admitting: *Deleted

## 2021-08-31 NOTE — Telephone Encounter (Signed)
Shelley Garcia (Key: [id number]) - [id number] Mounjaro 12.'5MG'$ /0.5ML pen-injectors Status: PA Response - Approved

## 2021-08-31 NOTE — Telephone Encounter (Signed)
Santiago Glad Barbier (Key: [id number]) - [id number] Mounjaro 12.'5MG'$ /0.5ML pen-injectors Status: PA Response - Approved

## 2021-09-08 ENCOUNTER — Ambulatory Visit (INDEPENDENT_AMBULATORY_CARE_PROVIDER_SITE_OTHER): Payer: No Typology Code available for payment source | Admitting: Family

## 2021-09-08 VITALS — BP 135/71 | HR 80 | Temp 98.2°F | Resp 16 | Ht 64.0 in | Wt 211.0 lb

## 2021-09-08 DIAGNOSIS — E785 Hyperlipidemia, unspecified: Secondary | ICD-10-CM

## 2021-09-08 DIAGNOSIS — E1165 Type 2 diabetes mellitus with hyperglycemia: Secondary | ICD-10-CM | POA: Diagnosis not present

## 2021-09-08 DIAGNOSIS — E7849 Other hyperlipidemia: Secondary | ICD-10-CM

## 2021-09-08 DIAGNOSIS — I10 Essential (primary) hypertension: Secondary | ICD-10-CM

## 2021-09-08 LAB — BASIC METABOLIC PANEL
BUN: 14 mg/dL (ref 6–23)
CO2: 24 mEq/L (ref 19–32)
Calcium: 9.2 mg/dL (ref 8.4–10.5)
Chloride: 105 mEq/L (ref 96–112)
Creatinine, Ser: 0.75 mg/dL (ref 0.40–1.20)
GFR: 90.5 mL/min (ref >60.00)
Glucose, Bld: 112 mg/dL — ABNORMAL HIGH (ref 70–99)
Potassium: 4.4 mEq/L (ref 3.5–5.1)
Sodium: 138 mEq/L (ref 135–145)

## 2021-09-08 LAB — LIPID PANEL
Cholesterol: 250 mg/dL — ABNORMAL HIGH (ref 0–200)
HDL: 46.3 mg/dL (ref >39.00)
NonHDL: 203.52
Total CHOL/HDL Ratio: 5
Triglycerides: 220 mg/dL — ABNORMAL HIGH (ref 0.0–149.0)
VLDL: 44 mg/dL — ABNORMAL HIGH (ref 0.0–40.0)

## 2021-09-08 LAB — LDL CHOLESTEROL, DIRECT: Direct LDL: 177 mg/dL

## 2021-09-08 LAB — HEMOGLOBIN A1C: Hgb A1c MFr Bld: 5.9 % (ref 4.6–6.5)

## 2021-09-08 MED ORDER — LISINOPRIL 10 MG PO TABS: 10.0000 mg | ORAL_TABLET | Freq: Every day | ORAL | 3 refills | Status: DC

## 2021-09-08 NOTE — Assessment & Plan Note (Signed)
Will repeat lipid panel. Continue livalo.

## 2021-09-08 NOTE — Assessment & Plan Note (Addendum)
BP Readings from Last 3 Encounters:  09/08/21 135/71  08/25/21 (!) 146/61  06/09/21 (!) 143/62   Per report she had hypotension and HA on the '20mg'$  of lisinopril.  She did tolerate the '10mg'$ . Will start the '10mg'$  today.  She will check bp once daily for 1 week and send me her readings via mychart.

## 2021-09-08 NOTE — Progress Notes (Signed)
Subjective:   By signing my name below, I, [name], attest that this documentation has been prepared under the direction and in the presence of Debbrah Alar NP, 09/08/2021   Patient ID: Shelley Garcia, female    DOB: September 11, 1965, 56 y.o.   MRN: [id number]  Chief Complaint  Patient presents with   Hypertension    Here for follow up, dc lisinopril 20 mg since Sunday due to side effects.     HPI Patient is in today for an office visit.  Blood Pressure/Lisinopril - She was previously taking 20 Mg of Lisinopril but has discontinued use. She reports that 40 minutes after taking the medication, she had a massive headache and white spots in her vision. After taking it, her blood pressure reading was about 100/68. A couple of hours later readings were measured again and was reported to be about 106/60. She has not taken any blood pressure medications since then. She was previously on 10 Mg of Lisinopril and reports no similar symptoms. She is interested in restarting 10 Mg of Lisinopril.   BP Readings from Last 3 Encounters:  09/08/21 135/71  08/25/21 (!) 146/61  06/09/21 (!) 143/62   Pulse Readings from Last 3 Encounters:  09/08/21 80  08/25/21 63  06/09/21 82   Weight - She is currently taking 12.5 Mg of Mounjaro. She reports mild redness and itchiness at the injection sight but denies of any other symptoms. She states that her highest weight was about 260 lbs Wt Readings from Last 3 Encounters:  09/08/21 211 lb (95.7 kg)  08/25/21 212 lb (96.2 kg)  06/09/21 218 lb (98.9 kg)     Health Maintenance Due  Topic Date Due   HIV Screening  Never done   Hepatitis C Screening  Never done   PAP SMEAR-Modifier  Never done   COLONOSCOPY (Pts 45-25yr Insurance coverage will need to be confirmed)  Never done   Zoster Vaccines- Shingrix (1 of 2) Never done   OPHTHALMOLOGY EXAM  08/20/2018   MAMMOGRAM  11/13/2020    Past Medical History:  Diagnosis Date   B12 deficiency  02/12/2015   Back pain    Cervical spine pain 09/01/2015   Depression with anxiety 07/29/2013   Diabetes mellitus    Diabetes mellitus type 2 with complications, uncontrolled 10/06/2010   Diagnosed with Type II DM in ~ 2008; pt does not tolerate Metformin (GI upset). Has been on Lantus since 2011. Vision assessment every 2 years- Optometry.    Dyspnea on exertion 10/12/2016   Elevated TSH 03/02/2017   Esophageal reflux 10/06/2010   Essential hypertension 11/23/2016   Gallbladder problem    GERD (gastroesophageal reflux disease)    History of chicken pox    History of migraine headaches 05/17/2012   Hyperlipidemia    Hypertension    Joint pain    Nonallopathic lesion of cervical region 11/18/2015   Nonallopathic lesion of lumbosacral region 11/18/2015   Nonallopathic lesion of thoracic region 11/18/2015   Obesity    Other fatigue 10/12/2016   Other hyperlipidemia 03/02/2017   Trapezius strain 02/12/2015   Type 2 diabetes mellitus without complication, without long-term current use of insulin ([facility] 10/12/2016   Vitamin B12 deficiency    Vitamin D deficiency     Past Surgical History:  Procedure Laterality Date   CHOLECYSTECTOMY     EXPLORATORY LAPAROTOMY     KIDNEY STONE SURGERY     WISDOM TOOTH EXTRACTION  07/31/2013    Family History  Problem Relation Age of Onset   Hypertension Mother 76       Deceased   Diabetes Mother    Mental illness Mother        Schizophrenia   Stroke Mother    Bipolar disorder Mother    Depression Mother    Anxiety disorder Mother    Obesity Mother    Hypertension Father    Diabetes Father    Lung cancer Father    Kidney failure Father    Diabetes Sister    Stroke Sister    Hypertension Sister    Mental illness Brother    Colon cancer Paternal Grandfather    Healthy Son        x2   Multiple sclerosis Maternal Aunt    Diabetes Other        Maternal/Paternal side   Hypertension Other        Maternal/Paternal side   Brain cancer Granddaughter      Social History   Socioeconomic History   Marital status: Married    Spouse name: Not on file   Number of children: 2   Years of education: Not on file   Highest education level: Not on file  Occupational History   Occupation: Nurse    Employer: [employer]  Tobacco Use   Smoking status: Former    Packs/day: 0.75    Types: Cigarettes    Start date: 06/11/2014    Quit date: 06/17/2021    Years since quitting: 0.2   Smokeless tobacco: Never   Tobacco comments:    pt doing the vapor cigerette   Substance and Sexual Activity   Alcohol use: Yes    Alcohol/week: 0.0 standard drinks of alcohol    Comment: rarely   Drug use: No   Sexual activity: Not on file  Other Topics Concern   Not on file  Social History Narrative   Works an Therapist, 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  Dx E11.8 1 each 0   COVID-19 mRNA bivalent vaccine, Pfizer, (PFIZER COVID-19 VAC BIVALENT) injection Inject into the muscle. 0.3 mL 0   glucose blood (FREESTYLE LITE) test strip Use as instructed to check blood sugar 2-3 times a day.  Dx  E11.8 100 each 5   Lancets (FREESTYLE) lancets Use to check blood sugar 2-3 times daily.  Dx E11.8 100 each 5   omeprazole (PRILOSEC) 40 MG capsule Take 1 capsule (40 mg total) by mouth daily. 90 capsule 1   tirzepatide (MOUNJARO) 12.5 MG/0.5ML Pen Inject 12.5 mg into the skin once a week. 6 mL 1   Pitavastatin Calcium 4 MG TABS TAKE 1 TABLET BY MOUTH ONCE A DAY 90 tablet 1    lisinopril (ZESTRIL) 20 MG tablet Take 1 tablet (20 mg total) by mouth daily. (Patient not taking: Reported on 09/08/2021) 90 tablet 1   No facility-administered medications prior to visit.    Allergies  Allergen Reactions  Metformin And Related Nausea And Vomiting    Made pt really sick   Codeine Rash    ROS See HPI    Objective:    Physical Exam Constitutional:      General: She is not in acute distress.    Appearance: Normal appearance. She is not ill-appearing.  HENT:     Head: Normocephalic and atraumatic.     Right Ear: External ear normal.     Left Ear: External ear normal.  Eyes:     Extraocular Movements: Extraocular movements intact.     Pupils: Pupils are equal, round, and reactive to light.  Cardiovascular:     Rate and Rhythm: Normal rate and regular rhythm.     Heart sounds: Normal heart sounds. No murmur heard.    No gallop.  Pulmonary:     Effort: Pulmonary effort is normal. No respiratory distress.     Breath sounds: Normal breath sounds. No wheezing or rales.  Skin:    General: Skin is warm and dry.  Neurological:     Mental Status: She is alert and oriented to person, place, and time.  Psychiatric:        Mood and Affect: Mood normal.        Behavior: Behavior normal.        Judgment: Judgment normal.     BP 135/71 (BP Location: Right Arm, Patient Position: Sitting, Cuff Size: Large)   Pulse 80   Temp 98.2 F (36.8 C) (Oral)   Resp 16   Ht '5\' 4"'$  (1.626 m)   Wt 211 lb (95.7 kg)   SpO2 97%   BMI 36.22 kg/m  Wt Readings from Last 3 Encounters:  09/08/21 211 lb (95.7 kg)  08/25/21 212 lb (96.2 kg)  06/09/21 218 lb (98.9 kg)       Assessment & Plan:   Problem List Items Addressed This Visit       Unprioritized   Uncontrolled type 2 diabetes mellitus with hyperglycemia ([place]) - Primary    Lab Results  Component Value Date   HGBA1C 6.7 (H) 06/09/2021   HGBA1C 9.9 (H) 01/21/2021   HGBA1C 8.4 (H) 04/29/2020   Lab Results  Component  Value Date   MICROALBUR 0.4 05/21/2016   LDLCALC 157 (H) 04/29/2020   CREATININE 0.72 06/09/2021  Last A1C was much better on Mounjaro. She continues with her weight loss efforts.       Relevant Medications   lisinopril (ZESTRIL) 10 MG tablet   Other Relevant Orders   Hemoglobin A1c   Primary hypertension    BP Readings from Last 3 Encounters:  09/08/21 135/71  08/25/21 (!) 146/61  06/09/21 (!) 143/62  Per report she had hypotension and HA on the '20mg'$  of lisinopril.  She did tolerate the '10mg'$ . Will start the '10mg'$  today.  She will check bp once daily for 1 week and send me her readings via mychart.       Relevant Medications   lisinopril (ZESTRIL) 10 MG tablet   Other Relevant Orders   Basic metabolic panel   Other hyperlipidemia   Relevant Medications   lisinopril (ZESTRIL) 10 MG tablet   Other Relevant Orders   Lipid panel   Hyperlipidemia    Will repeat lipid panel. Continue livalo.       Relevant Medications   lisinopril (ZESTRIL) 10 MG tablet   Other Relevant Orders   Lipid panel    Meds ordered this encounter  Medications   lisinopril (ZESTRIL) 10 MG  tablet    Sig: Take 1 tablet (10 mg total) by mouth daily.    Dispense:  90 tablet    Refill:  3    Order Specific Question:   Supervising Provider    Answer:   Penni Homans A [[id number]]    I, Nance Pear, NP, personally preformed the services described in this documentation.  All medical record entries made by the scribe were at my direction and in my presence.  I have reviewed the chart and discharge instructions (if applicable) and agree that the record reflects my personal performance and is accurate and complete. 09/08/2021  I,Amber Collins,acting as a Education administrator for Nance Pear, NP.,have documented all relevant documentation on the behalf of Nance Pear, NP,as directed by  Nance Pear, NP while in the presence of Nance Pear, NP.  Nance Pear, NP

## 2021-09-08 NOTE — Assessment & Plan Note (Signed)
Lab Results  Component Value Date   HGBA1C 6.7 (H) 06/09/2021   HGBA1C 9.9 (H) 01/21/2021   HGBA1C 8.4 (H) 04/29/2020   Lab Results  Component Value Date   MICROALBUR 0.4 05/21/2016   LDLCALC 157 (H) 04/29/2020   CREATININE 0.72 06/09/2021   Last A1C was much better on Mounjaro. She continues with her weight loss efforts.

## 2021-09-09 ENCOUNTER — Other Ambulatory Visit (HOSPITAL_COMMUNITY): Payer: Self-pay

## 2021-09-09 ENCOUNTER — Telehealth: Payer: Self-pay | Admitting: Family

## 2021-09-09 MED ORDER — ROSUVASTATIN CALCIUM 20 MG PO TABS
20.0000 mg | ORAL_TABLET | Freq: Every day | ORAL | 0 refills | Status: DC
  Filled 2021-09-09: qty 90, 90d supply, fill #0

## 2021-09-09 NOTE — Telephone Encounter (Signed)
Cholesterol is quite high despite Livalo. I see she had fatigue on simvastatin in the past. I would like for her to stop livalo and start crestor.  Crestor is stronger and also generally well tolerated. Let me know if she has any issues on the crestor. A1C has improved.  Follow up in September as scheduled.

## 2021-09-09 NOTE — Telephone Encounter (Signed)
Called but no answer, lvm for patient to call back 

## 2021-09-16 NOTE — Telephone Encounter (Signed)
Patient advised of results, medication change and provider's comments.

## 2021-11-11 ENCOUNTER — Other Ambulatory Visit (HOSPITAL_BASED_OUTPATIENT_CLINIC_OR_DEPARTMENT_OTHER): Payer: Self-pay

## 2021-11-13 ENCOUNTER — Other Ambulatory Visit (HOSPITAL_COMMUNITY): Payer: Self-pay

## 2021-11-13 ENCOUNTER — Other Ambulatory Visit (HOSPITAL_BASED_OUTPATIENT_CLINIC_OR_DEPARTMENT_OTHER): Payer: Self-pay

## 2021-12-02 ENCOUNTER — Encounter: Payer: Self-pay | Admitting: Family

## 2021-12-03 ENCOUNTER — Other Ambulatory Visit (HOSPITAL_BASED_OUTPATIENT_CLINIC_OR_DEPARTMENT_OTHER): Payer: Self-pay

## 2021-12-03 MED ORDER — TIRZEPATIDE 15 MG/0.5ML ~~LOC~~ SOAJ
15.0000 mg | SUBCUTANEOUS | 2 refills | Status: DC
  Filled 2021-12-03 – 2021-12-04 (×2): qty 2, 28d supply, fill #0
  Filled 2022-01-08: qty 2, 28d supply, fill #1
  Filled 2022-02-05 – 2022-02-08 (×2): qty 2, 28d supply, fill #2
  Filled 2022-03-03: qty 2, 28d supply, fill #3

## 2021-12-04 ENCOUNTER — Other Ambulatory Visit (HOSPITAL_BASED_OUTPATIENT_CLINIC_OR_DEPARTMENT_OTHER): Payer: Self-pay

## 2021-12-08 ENCOUNTER — Telehealth: Payer: Self-pay | Admitting: Family

## 2021-12-08 ENCOUNTER — Other Ambulatory Visit (HOSPITAL_COMMUNITY): Payer: Self-pay

## 2021-12-08 ENCOUNTER — Ambulatory Visit (INDEPENDENT_AMBULATORY_CARE_PROVIDER_SITE_OTHER): Payer: No Typology Code available for payment source | Admitting: Family

## 2021-12-08 VITALS — BP 135/71 | HR 85 | Temp 98.0°F | Resp 16 | Wt 205.0 lb

## 2021-12-08 DIAGNOSIS — E538 Deficiency of other specified B group vitamins: Secondary | ICD-10-CM | POA: Diagnosis not present

## 2021-12-08 DIAGNOSIS — K219 Gastro-esophageal reflux disease without esophagitis: Secondary | ICD-10-CM

## 2021-12-08 DIAGNOSIS — Z23 Encounter for immunization: Secondary | ICD-10-CM

## 2021-12-08 DIAGNOSIS — I10 Essential (primary) hypertension: Secondary | ICD-10-CM

## 2021-12-08 DIAGNOSIS — E559 Vitamin D deficiency, unspecified: Secondary | ICD-10-CM

## 2021-12-08 DIAGNOSIS — E6609 Other obesity due to excess calories: Secondary | ICD-10-CM

## 2021-12-08 DIAGNOSIS — E119 Type 2 diabetes mellitus without complications: Secondary | ICD-10-CM

## 2021-12-08 DIAGNOSIS — Z6837 Body mass index (BMI) 37.0-37.9, adult: Secondary | ICD-10-CM

## 2021-12-08 DIAGNOSIS — Z1159 Encounter for screening for other viral diseases: Secondary | ICD-10-CM

## 2021-12-08 DIAGNOSIS — Z114 Encounter for screening for human immunodeficiency virus [HIV]: Secondary | ICD-10-CM

## 2021-12-08 LAB — HEMOGLOBIN A1C: Hgb A1c MFr Bld: 5.7 % (ref 4.6–6.5)

## 2021-12-08 LAB — VITAMIN B12: Vitamin B-12: 144 pg/mL — ABNORMAL LOW (ref 211–911)

## 2021-12-08 LAB — VITAMIN D 25 HYDROXY (VIT D DEFICIENCY, FRACTURES): VITD: 10.27 ng/mL — ABNORMAL LOW (ref 30.00–100.00)

## 2021-12-08 MED ORDER — VITAMIN D (ERGOCALCIFEROL) 1.25 MG (50000 UNIT) PO CAPS
50000.0000 [IU] | ORAL_CAPSULE | ORAL | 0 refills | Status: DC
  Filled 2021-12-08: qty 12, 84d supply, fill #0

## 2021-12-08 MED ORDER — OMEPRAZOLE 40 MG PO CPDR
40.0000 mg | DELAYED_RELEASE_CAPSULE | Freq: Every day | ORAL | 1 refills | Status: DC
  Filled 2021-12-08: qty 90, 90d supply, fill #0

## 2021-12-08 NOTE — Telephone Encounter (Signed)
B12 is low. I would recommend that she restart b12 injections 1056mg IM weekly x 4, then monthly.  Vitamin D level is low.  Advise patient to begin vit D 50000 units once weekly for 12 weeks, then repeat vit D level (dx Vit D deficiency).

## 2021-12-08 NOTE — Addendum Note (Signed)
Addended by: Debbrah Alar on: 12/08/2021 12:01 PM   Modules accepted: Orders

## 2021-12-08 NOTE — Assessment & Plan Note (Signed)
BP Readings from Last 3 Encounters:  12/08/21 135/71  09/08/21 135/71  08/25/21 (!) 146/61   BP stable at home.  Continue lisinopril 10 mg.

## 2021-12-08 NOTE — Assessment & Plan Note (Signed)
Start mounjaro '15mg'$ . Encouraged pt to add exercise to help with weight loss. Recommended that she try low impact exercise such as stationary bike, swimming, water aerobics which will be more comfortable on her joints.

## 2021-12-08 NOTE — Assessment & Plan Note (Addendum)
Lab Results  Component Value Date   HGBA1C 5.9 09/08/2021   HGBA1C 6.7 (H) 06/09/2021   HGBA1C 9.9 (H) 01/21/2021   Lab Results  Component Value Date   MICROALBUR 0.4 05/21/2016   LDLCALC 157 (H) 04/29/2020   CREATININE 0.75 09/08/2021   Last A1C looked great.  Recheck today.

## 2021-12-08 NOTE — Assessment & Plan Note (Addendum)
Still having some breakthrough symptoms. Discussed gerd precautions. Add tums prn.

## 2021-12-08 NOTE — Progress Notes (Addendum)
Subjective:   By signing my name below, I, [name], attest that this documentation has been prepared under the direction and in the presence of [place], NP 12/08/2021   Patient ID: Shelley Garcia, female    DOB: August 28, 1965, 56 y.o.   MRN: [id number]  Chief Complaint  Patient presents with   Diabetes    Here for follow up    HPI Patient is in today for an office visit  Weight: She is continuing mounjaro.  She is about to begin the 15 Mg/0.5 mL of Mounjaro and reports that her weight is not decreasing. She does not regularly exercise. When she does exercise, she experiences joint pain. She does not notice a change in her suppression of appetite as of recently.  Wt Readings from Last 3 Encounters:  12/08/21 205 lb (93 kg)  09/08/21 211 lb (95.7 kg)  08/25/21 212 lb (96.2 kg)   Blood Pressure: She is currently taking 10 Mg of Lisinopril. She states that her blood pressure is normally lower at home.  BP Readings from Last 3 Encounters:  12/08/21 135/71  09/08/21 135/71  08/25/21 (!) 146/61   Pulse Readings from Last 3 Encounters:  12/08/21 85  09/08/21 80  08/25/21 63   A1C: Her A1C levels are normal Lab Results  Component Value Date   HGBA1C 5.9 09/08/2021   Vitamin D: She does not take a Vitamin D supplement.  Vitamin B12: She is not receiving Vitamin B12 injections but states that she has received them in the past.  Reflux: She reports that her reflux is worsening. She is taking 40 Mg of Prilosec daily.  Cologuard: She has not completed a Cologuard.  Immunizations: She is interested in receiving an influenza vaccine during today's visit. She is also interested in receiving an HIV/HepC screening during today's visit.  Vision: She is not UTD on vision exams  Pap Smear: She is due for a pap smear.   Health Maintenance Due  Topic Date Due   Hepatitis C Screening  Never done   PAP SMEAR-Modifier  Never done   COLONOSCOPY (Pts 45-64yr Insurance  coverage will need to be confirmed)  Never done   Zoster Vaccines- Shingrix (1 of 2) Never done   OPHTHALMOLOGY EXAM  08/20/2018   MAMMOGRAM  11/13/2020    Past Medical History:  Diagnosis Date   B12 deficiency 02/12/2015   Back pain    Cervical spine pain 09/01/2015   Depression with anxiety 07/29/2013   Diabetes mellitus    Diabetes mellitus type 2 with complications, uncontrolled 10/06/2010   Diagnosed with Type II DM in ~ 2008; pt does not tolerate Metformin (GI upset). Has been on Lantus since 2011. Vision assessment every 2 years- Optometry.    Dyspnea on exertion 10/12/2016   Elevated TSH 03/02/2017   Esophageal reflux 10/06/2010   Essential hypertension 11/23/2016   Gallbladder problem    GERD (gastroesophageal reflux disease)    History of chicken pox    History of migraine headaches 05/17/2012   Hyperlipidemia    Hypertension    Joint pain    Nonallopathic lesion of cervical region 11/18/2015   Nonallopathic lesion of lumbosacral region 11/18/2015   Nonallopathic lesion of thoracic region 11/18/2015   Obesity    Other fatigue 10/12/2016   Other hyperlipidemia 03/02/2017   Trapezius strain 02/12/2015   Type 2 diabetes mellitus without complication, [place] long[place] 10/12/[place]   Vitamin B12 deficiency    Vitamin  D deficiency     Past Surgical History:  Procedure Laterality Date   CHOLECYSTECTOMY     EXPLORATORY LAPAROTOMY     KIDNEY STONE SURGERY     WISDOM TOOTH EXTRACTION  07/31/2013    Family History  Problem Relation Age of Onset   Hypertension Mother 91       Deceased   Diabetes Mother    Mental illness Mother        Schizophrenia   Stroke Mother    Bipolar disorder Mother    Depression Mother    Anxiety disorder Mother    Obesity Mother    Hypertension Father    Diabetes Father    Lung cancer Father    Kidney failure Father    Diabetes Sister    Stroke Sister    Hypertension Sister    Mental illness Brother    Colon cancer  Paternal Grandfather    Healthy Son        x2   Multiple sclerosis Maternal Aunt    Diabetes Other        Maternal/Paternal side   Hypertension Other        Maternal/Paternal side   Brain cancer Granddaughter     Social History   Socioeconomic History   Marital status: Married    Spouse name: Not on file   Number of children: 2   Years of education: Not on file   Highest education level: Not on file  Occupational History   Occupation: Nurse    Employer: [employer]  Tobacco Use   Smoking status: Former    Packs/day: 0.75    Types: Cigarettes    Start date: 06/11/2014    Quit date: 06/17/2021    Years since quitting: 0.4   Smokeless tobacco: Never   Tobacco comments:    pt doing the vapor cigerette   Substance and Sexual Activity   Alcohol use: Yes    Alcohol/week: 0.0 standard drinks of alcohol    Comment: rarely   Drug use: No   Sexual activity: Not on file  Other Topics Concern   Not on file  Social History Narrative   Works an Therapist, 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  Dx E11.8 1 each 0   COVID-19 mRNA bivalent vaccine, Pfizer, (PFIZER COVID-19 VAC BIVALENT) injection Inject into the muscle. 0.3 mL 0   glucose blood (FREESTYLE LITE) test strip Use as instructed to check blood sugar 2-3 times a day.  Dx  E11.8 100 each 5   Lancets (FREESTYLE) lancets Use to check blood sugar 2-3 times daily.  Dx E11.8 100 each 5   lisinopril (ZESTRIL) 10 MG tablet Take 1 tablet (10 mg  total) by mouth daily. 90 tablet 3   rosuvastatin (CRESTOR) 20 MG tablet Take 1 tablet (20 mg total) by mouth daily. 90 tablet 0   tirzepatide (MOUNJARO) 15 MG/0.5ML Pen Inject 15 mg into the skin once  a week. 6 mL 2   omeprazole (PRILOSEC) 40 MG capsule Take 1 capsule (40 mg total) by mouth daily. 90 capsule 1   No facility-administered medications prior to visit.    Allergies  Allergen Reactions   Metformin And Related Nausea And Vomiting    Made pt really sick   Codeine Rash    ROS See HPI    Objective:    Physical Exam Constitutional:      General: She is not in acute distress.    Appearance: Normal appearance. She is not ill-appearing.  HENT:     Head: Normocephalic and atraumatic.     Right Ear: External ear normal.     Left Ear: External ear normal.  Eyes:     Extraocular Movements: Extraocular movements intact.     Pupils: Pupils are equal, round, and reactive to light.  Cardiovascular:     Rate and Rhythm: Normal rate and regular rhythm.     Heart sounds: Normal heart sounds. No murmur heard.    No gallop.  Pulmonary:     Effort: Pulmonary effort is normal. No respiratory distress.     Breath sounds: Normal breath sounds. No wheezing or rales.  Skin:    General: Skin is warm and dry.  Neurological:     Mental Status: She is alert and oriented to person, place, and time.  Psychiatric:        Mood and Affect: Mood normal.        Behavior: Behavior normal.        Judgment: Judgment normal.     BP 135/71 (BP Location: Right Arm, Patient Position: Sitting, Cuff Size: Large)   Pulse 85   Temp 98 F (36.7 C) (Oral)   Resp 16   Wt 205 lb (93 kg)   SpO2 98%   BMI 35.19 kg/m  Wt Readings from Last 3 Encounters:  12/08/21 205 lb (93 kg)  09/08/21 211 lb (95.7 kg)  08/25/21 212 lb (96.2 kg)       Assessment & Plan:   Problem List Items Addressed This Visit       Unprioritized   Vitamin D deficiency   Relevant Orders   VITAMIN D 25 Hydroxy (Vit-D  Deficiency, Fractures)   Type 2 diabetes mellitus without complication, without long-term current use of insulin (HCC)    Lab Results  Component Value Date   HGBA1C 5.9 09/08/2021   HGBA1C 6.7 (H) 06/09/2021   HGBA1C 9.9 (H) 01/21/2021   Lab Results  Component Value Date   MICROALBUR 0.4 05/21/2016   LDLCALC 157 (H) 04/29/2020   CREATININE 0.75 09/08/2021  Last A1C looked great.  Recheck today.       Relevant Orders   Hemoglobin A1c   Primary hypertension    BP Readings from Last 3 Encounters:  12/08/21 135/71  09/08/21 135/71  08/25/21 (!) 146/61  BP stable at home.  Continue lisinopril 10 mg.        GERD (gastroesophageal reflux disease)    Still having some breakthrough symptoms. Discussed gerd precautions. Add tums prn.       Relevant Medications   omeprazole (PRILOSEC) 40 MG capsule   Class 2 obesity due to excess calories without serious comorbidity with body mass index (BMI) of 37.0 to 37.9 in adult    Start mounjaro '15mg'$ . Encouraged pt to add exercise to help with weight loss. Recommended that she try low impact exercise such as stationary bike, swimming, water aerobics which will be more  comfortable on her joints.       B12 deficiency   Relevant Orders   B12   Other Visit Diagnoses     Needs flu shot    -  Primary   Relevant Orders   Flu Vaccine QUAD 6+ mos PF IM (Fluarix Quad PF) (Completed)   Need for hepatitis C screening test       Relevant Orders   Hepatitis C Antibody   Encounter for screening for HIV       Relevant Orders   HIV antibody (with reflex)       Meds ordered this encounter  Medications   omeprazole (PRILOSEC) 40 MG capsule    Sig: Take 1 capsule (40 mg total) by mouth daily.    Dispense:  90 capsule    Refill:  1    Order Specific Question:   Supervising Provider    Answer:   Penni Homans A [[id number]]    I, Nance Pear, NP, personally preformed the services described in this documentation.  All medical record entries  made by the scribe were at my direction and in my presence.  I have reviewed the chart and discharge instructions (if applicable) and agree that the record reflects my personal performance and is accurate and complete. 12/08/2021   I,Amber Collins,acting as a Education administrator for Nance Pear, NP.,have documented all relevant documentation on the behalf of Nance Pear, NP,as directed by  Nance Pear, NP while in the presence of Nance Pear, NP.    Nance Pear, NP

## 2021-12-09 ENCOUNTER — Other Ambulatory Visit (HOSPITAL_COMMUNITY): Payer: Self-pay

## 2021-12-09 LAB — HIV ANTIBODY (ROUTINE TESTING W REFLEX): HIV 1&2 Ab, 4th Generation: NONREACTIVE

## 2021-12-09 LAB — HEPATITIS C ANTIBODY: Hepatitis C Ab: NONREACTIVE

## 2021-12-09 MED ORDER — CYANOCOBALAMIN 1000 MCG/ML IJ SOLN
INTRAMUSCULAR | 0 refills | Status: DC
  Filled 2021-12-09: qty 6, 84d supply, fill #0

## 2021-12-09 MED ORDER — "LUER LOCK SAFETY SYRINGES 23G X 1"" 3 ML MISC"
1.0000 | 0 refills | Status: DC
  Filled 2021-12-09 – 2021-12-10 (×2): qty 12, 84d supply, fill #0

## 2021-12-09 MED ORDER — "NEEDLE (DISP) 18G X 1"" MISC"
1.0000 | 0 refills | Status: DC
  Filled 2021-12-09: qty 12, 84d supply, fill #0

## 2021-12-09 NOTE — Telephone Encounter (Signed)
Patient advised of results. She know how to self administer I'm injections, she will start this at home. Medication and supplies sent to her pharmacy.

## 2021-12-10 ENCOUNTER — Other Ambulatory Visit (HOSPITAL_COMMUNITY): Payer: Self-pay

## 2021-12-11 ENCOUNTER — Other Ambulatory Visit (HOSPITAL_COMMUNITY): Payer: Self-pay

## 2022-01-08 ENCOUNTER — Other Ambulatory Visit (HOSPITAL_BASED_OUTPATIENT_CLINIC_OR_DEPARTMENT_OTHER): Payer: Self-pay

## 2022-02-05 ENCOUNTER — Other Ambulatory Visit (HOSPITAL_BASED_OUTPATIENT_CLINIC_OR_DEPARTMENT_OTHER): Payer: Self-pay

## 2022-02-05 ENCOUNTER — Other Ambulatory Visit: Payer: Self-pay | Admitting: Family

## 2022-02-05 MED ORDER — ROSUVASTATIN CALCIUM 20 MG PO TABS
20.0000 mg | ORAL_TABLET | Freq: Every day | ORAL | 0 refills | Status: DC
  Filled 2022-02-05 – 2022-02-08 (×2): qty 90, 90d supply, fill #0

## 2022-02-08 ENCOUNTER — Other Ambulatory Visit (HOSPITAL_BASED_OUTPATIENT_CLINIC_OR_DEPARTMENT_OTHER): Payer: Self-pay

## 2022-02-09 ENCOUNTER — Other Ambulatory Visit (HOSPITAL_BASED_OUTPATIENT_CLINIC_OR_DEPARTMENT_OTHER): Payer: Self-pay

## 2022-02-26 ENCOUNTER — Encounter: Payer: Self-pay | Admitting: Family

## 2022-02-27 ENCOUNTER — Telehealth: Payer: Self-pay | Admitting: Family

## 2022-02-27 MED ORDER — METHOCARBAMOL 500 MG PO TABS: 500.0000 mg | ORAL_TABLET | Freq: Three times a day (TID) | ORAL | 0 refills | Status: DC | PRN

## 2022-02-27 MED ORDER — METHYLPREDNISOLONE 4 MG PO TBPK: ORAL_TABLET | ORAL | 0 refills | Status: DC

## 2022-02-27 NOTE — Telephone Encounter (Signed)
Opened in error

## 2022-02-27 NOTE — Addendum Note (Signed)
Addended by: Debbrah Alar on: 02/27/2022 07:07 PM   Modules accepted: Orders

## 2022-03-03 ENCOUNTER — Other Ambulatory Visit (HOSPITAL_COMMUNITY): Payer: Self-pay

## 2022-03-09 ENCOUNTER — Ambulatory Visit: Payer: No Typology Code available for payment source | Admitting: Family

## 2022-03-10 ENCOUNTER — Encounter: Payer: Self-pay | Admitting: Family

## 2022-03-12 ENCOUNTER — Other Ambulatory Visit (HOSPITAL_BASED_OUTPATIENT_CLINIC_OR_DEPARTMENT_OTHER): Payer: Self-pay

## 2022-03-12 ENCOUNTER — Ambulatory Visit (INDEPENDENT_AMBULATORY_CARE_PROVIDER_SITE_OTHER): Payer: No Typology Code available for payment source | Admitting: Family

## 2022-03-12 VITALS — BP 134/77 | HR 82 | Temp 97.7°F | Resp 18 | Ht 64.0 in | Wt 200.8 lb

## 2022-03-12 DIAGNOSIS — E559 Vitamin D deficiency, unspecified: Secondary | ICD-10-CM

## 2022-03-12 DIAGNOSIS — I10 Essential (primary) hypertension: Secondary | ICD-10-CM

## 2022-03-12 DIAGNOSIS — R7989 Other specified abnormal findings of blood chemistry: Secondary | ICD-10-CM | POA: Diagnosis not present

## 2022-03-12 DIAGNOSIS — E785 Hyperlipidemia, unspecified: Secondary | ICD-10-CM | POA: Diagnosis not present

## 2022-03-12 DIAGNOSIS — M25512 Pain in left shoulder: Secondary | ICD-10-CM

## 2022-03-12 DIAGNOSIS — E119 Type 2 diabetes mellitus without complications: Secondary | ICD-10-CM | POA: Diagnosis not present

## 2022-03-12 DIAGNOSIS — E538 Deficiency of other specified B group vitamins: Secondary | ICD-10-CM | POA: Diagnosis not present

## 2022-03-12 DIAGNOSIS — M778 Other enthesopathies, not elsewhere classified: Secondary | ICD-10-CM | POA: Insufficient documentation

## 2022-03-12 LAB — LIPID PANEL
Cholesterol: 218 mg/dL — ABNORMAL HIGH (ref 0–200)
HDL: 52 mg/dL (ref >39.00)
LDL Cholesterol: 133 mg/dL — ABNORMAL HIGH (ref 0–99)
NonHDL: 166.05
Total CHOL/HDL Ratio: 4
Triglycerides: 163 mg/dL — ABNORMAL HIGH (ref 0.0–149.0)
VLDL: 32.6 mg/dL (ref 0.0–40.0)

## 2022-03-12 LAB — BASIC METABOLIC PANEL
BUN: 12 mg/dL (ref 6–23)
CO2: 28 mEq/L (ref 19–32)
Calcium: 8.9 mg/dL (ref 8.4–10.5)
Chloride: 103 mEq/L (ref 96–112)
Creatinine, Ser: 0.7 mg/dL (ref 0.40–1.20)
GFR: 97.96 mL/min (ref >60.00)
Glucose, Bld: 90 mg/dL (ref 70–99)
Potassium: 4.4 mEq/L (ref 3.5–5.1)
Sodium: 137 mEq/L (ref 135–145)

## 2022-03-12 LAB — VITAMIN B12: Vitamin B-12: 252 pg/mL (ref 211–911)

## 2022-03-12 LAB — VITAMIN D 25 HYDROXY (VIT D DEFICIENCY, FRACTURES): VITD: 11.14 ng/mL — ABNORMAL LOW (ref 30.00–100.00)

## 2022-03-12 LAB — TSH: TSH: 2.9 u[IU]/mL (ref 0.35–5.50)

## 2022-03-12 MED ORDER — TIRZEPATIDE 15 MG/0.5ML ~~LOC~~ SOAJ
15.0000 mg | SUBCUTANEOUS | 1 refills | Status: DC
  Filled 2022-03-12: qty 6, 84d supply, fill #0
  Filled 2022-03-30 – 2022-03-31 (×2): qty 2, 28d supply, fill #0
  Filled 2022-05-01: qty 2, 28d supply, fill #1
  Filled 2022-05-31: qty 2, 28d supply, fill #2
  Filled 2022-06-25: qty 2, 28d supply, fill #3

## 2022-03-12 MED ORDER — MELOXICAM 15 MG PO TABS
15.0000 mg | ORAL_TABLET | Freq: Every day | ORAL | 0 refills | Status: DC
  Filled 2022-03-12: qty 30, 30d supply, fill #0

## 2022-03-12 MED ORDER — METHOCARBAMOL 500 MG PO TABS
500.0000 mg | ORAL_TABLET | Freq: Three times a day (TID) | ORAL | 0 refills | Status: DC | PRN
  Filled 2022-03-12: qty 20, 7d supply, fill #0

## 2022-03-12 NOTE — Progress Notes (Signed)
Subjective:   By signing my name below, I, Shelley Garcia, attest that this documentation has been prepared under the direction and in the presence of Karie Chimera, NP 03/12/2022     Patient ID: Shelley Garcia, female    DOB: March 25, 1966, 56 y.o.   MRN: [id number]  Chief Complaint  Patient presents with   3 month follow up    Concerns/ questions: L arm issues Eye exam:  none yet Foot exam due    HPI Patient is in today for an office visit   Refill: She is requesting a refill of 500 mg of Robaxin, a 90-day supply of '15mg'$ /0.36m of Mounjaro, and 15 mg of Meloxicam.   Shoulder Pain/Aches: She reports that her left shoulder aches are persistent. She has sharp pains throughout her left arm with occasional numbness in her left hand. She was previously taking Prednisone which alleviated her symptoms. When she mobilizes her left arm, she describes pain as excruciating. She also takes OTC Ibuprofen for her symptoms.   Blood Sugars: She has not been regularly checking her blood sugar levels at home. She states that overall, she feels great. She notes that she has lost some weight. She is currently taking 15 mg/0.5 mL of Mounjaro.  Lab Results  Component Value Date   HGBA1C 5.7 12/08/2021   Wt Readings from Last 3 Encounters:  03/12/22 200 lb 12.8 oz (91.1 kg)  12/08/21 205 lb (93 kg)  09/08/21 211 lb (95.7 kg)   Vitamin D: She is nearing the end of the 12-weeks worth of 1.25 mg of Drisdol.   Blood Pressure: As of today's visit, her blood pressure is normal. BP Readings from Last 3 Encounters:  03/12/22 134/77  12/08/21 135/71  09/08/21 135/71   Pulse Readings from Last 3 Encounters:  03/12/22 82  12/08/21 85  09/08/21 80   Cholesterol: She is currently taking 20 mg of Crestor. Lab Results  Component Value Date   CHOL 250 (H) 09/08/2021   HDL 46.30 09/08/2021   LDLCALC 157 (H) 04/29/2020   LDLDIRECT 177.0 09/08/2021   TRIG 220.0 (H) 09/08/2021   CHOLHDL 5 09/08/2021      Health Maintenance Due  Topic Date Due   PAP SMEAR-Modifier  Never done   COLONOSCOPY (Pts 45-471yrInsurance coverage will need to be confirmed)  Never done   Zoster Vaccines- Shingrix (1 of 2) Never done   Diabetic kidney evaluation - Urine ACR  03/02/2018   OPHTHALMOLOGY EXAM  08/20/2018   MAMMOGRAM  11/13/2020   COVID-19 Vaccine (5 - 2023-24 season) 11/27/2021    Past Medical History:  Diagnosis Date   B12 deficiency 02/12/2015   Back pain    Cervical spine pain 09/01/2015   Depression with anxiety 07/29/2013   Diabetes mellitus    Diabetes mellitus type 2 with complications, uncontrolled 10/06/2010   Diagnosed with Type II DM in ~ 2008; pt does not tolerate Metformin (GI upset). Has been on Lantus since 2011. Vision assessment every 2 years- Optometry.    Dyspnea on exertion 10/12/2016   Elevated TSH 03/02/2017   Esophageal reflux 10/06/2010   Essential hypertension 11/23/2016   Gallbladder problem    GERD (gastroesophageal reflux disease)    History of chicken pox    History of migraine headaches 05/17/2012   Hyperlipidemia    Hypertension    Joint pain    Nonallopathic lesion of cervical region 11/18/2015   Nonallopathic lesion of lumbosacral region 11/18/2015   Nonallopathic lesion of thoracic  region 11/18/2015   Obesity    Other fatigue 10/12/2016   Other hyperlipidemia 03/02/2017   Trapezius strain 02/12/2015   Type 2 diabetes mellitus without complication, without long-term current use of insulin ([place]) 10/12/2016   Vitamin B12 deficiency    Vitamin D deficiency     Past Surgical History:  Procedure Laterality Date   CHOLECYSTECTOMY     EXPLORATORY LAPAROTOMY     KIDNEY STONE SURGERY     WISDOM TOOTH EXTRACTION  07/31/2013    Family History  Problem Relation Age of Onset   Hypertension Mother 58       Deceased   Diabetes Mother    Mental illness Mother        Schizophrenia   Stroke Mother    Bipolar disorder Mother    Depression Mother    Anxiety disorder  Mother    Obesity Mother    Hypertension Father    Diabetes Father    Lung cancer Father    Kidney failure Father    Diabetes Sister    Stroke Sister    Hypertension Sister    Mental illness Brother    Colon cancer Paternal Grandfather    Healthy Son        x2   Multiple sclerosis Maternal Aunt    Diabetes Other        Maternal/Paternal side   Hypertension Other        Maternal/Paternal side   Brain cancer Granddaughter     Social History   Socioeconomic History   Marital status: Married    Spouse name: Not on file   Number of children: 2   Years of education: Not on file   Highest education level: Not on file  Occupational History   Occupation: Nurse    Employer: [employer]  Tobacco Use   Smoking status: Former    Packs/day: 0.75    Types: Cigarettes    Start date: 06/11/2014    Quit date: 06/17/2021    Years since quitting: 0.7   Smokeless tobacco: Never   Tobacco comments:    pt doing the vapor cigerette   Substance and Sexual Activity   Alcohol use: Yes    Alcohol/week: 0.0 standard drinks of alcohol    Comment: rarely   Drug use: No   Sexual activity: Not on file  Other Topics Concern   Not on file  Social History Narrative   Works an Therapist, 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  Dx E11.8 1 each 0   cyanocobalamin (VITAMIN B12) 1000 MCG/ML injection Inject 1 ml IM once a week for four weeks and once a month after that 9 mL 0   glucose blood (FREESTYLE LITE)  test strip Use as instructed to check blood sugar 2-3 times a day.  Dx  E11.8 100 each 5   Lancets (FREESTYLE) lancets Use to check blood sugar 2-3 times daily.  Dx E11.8 100  each 5   lisinopril (ZESTRIL) 10 MG tablet Take 1 tablet (10 mg total) by mouth daily. 90 tablet 3   NEEDLE, DISP, 18 G 18G X 1" MISC 1 each by Does not apply route every 7 (seven) days. 12 each 0   omeprazole (PRILOSEC) 40 MG capsule Take 1 capsule (40 mg total) by mouth daily. 90 capsule 1   rosuvastatin (CRESTOR) 20 MG tablet Take 1 tablet (20 mg total) by mouth daily. 90 tablet 0   SYRINGE-NEEDLE, DISP, 3 ML (LUER LOCK SAFETY SYRINGES) 23G X 1" 3 ML MISC Use as directed every 7 days 12 each 0   Vitamin D, Ergocalciferol, (DRISDOL) 1.25 MG (50000 UNIT) CAPS capsule Take 1 capsule (50,000 Units total) by mouth every 7 (seven) days. 12 capsule 0   COVID-19 mRNA bivalent vaccine, Pfizer, (PFIZER COVID-19 VAC BIVALENT) injection Inject into the muscle. 0.3 mL 0   methocarbamol (ROBAXIN) 500 MG tablet Take 1 tablet (500 mg total) by mouth every 8 (eight) hours as needed for muscle spasms. 20 tablet 0   tirzepatide (MOUNJARO) 15 MG/0.5ML Pen Inject 15 mg into the skin once a week. 6 mL 2   methylPREDNISolone (MEDROL DOSEPAK) 4 MG TBPK tablet Take per package instructions 21 tablet 0   No facility-administered medications prior to visit.    Allergies  Allergen Reactions   Metformin And Related Nausea And Vomiting    Made pt really sick   Codeine Rash    ROS    See HPI Objective:    Physical Exam Constitutional:      General: She is not in acute distress.    Appearance: Normal appearance. She is not ill-appearing.  HENT:     Head: Normocephalic and atraumatic.     Right Ear: External ear normal.     Left Ear: External ear normal.  Eyes:     Extraocular Movements: Extraocular movements intact.     Pupils: Pupils are equal, round, and reactive to light.  Cardiovascular:     Rate and Rhythm: Normal rate and  regular rhythm.     Pulses:          Dorsalis pedis pulses are 2+ on the right side and 2+ on the left side.       Posterior tibial pulses are 2+ on the right side and 2+ on the left side.     Heart sounds: Normal heart sounds. No murmur heard.    No gallop.  Pulmonary:     Effort: Pulmonary effort is normal. No respiratory distress.     Breath sounds: Normal breath sounds. No wheezing or rales.  Skin:    General: Skin is warm and dry.  Neurological:     Mental Status: She is alert and oriented to person, place, and time.  Psychiatric:        Mood and Affect: Mood normal.        Behavior: Behavior normal.        Judgment: Judgment normal.    BP 134/77 (BP Location: Left Arm, Patient Position: Sitting, Cuff Size: Normal)   Pulse 82   Temp 97.7 F (36.5 C) (Oral)   Resp 18   Ht '5\' 4"'$  (1.626 m)   Wt 200 lb 12.8 oz (91.1 kg)   SpO2 99%   BMI 34.47 kg/m  Wt Readings from Last 3 Encounters:  03/12/22 200 lb 12.8 oz (91.1 kg)  12/08/21 205 lb (93 kg)  09/08/21 211 lb (95.7 kg)   Diabetic Foot Exam -  Simple   Simple Foot Form Diabetic Foot exam was performed with the following findings: Yes 03/12/2022 10:55 AM  Visual Inspection No deformities, no ulcerations, no other skin breakdown bilaterally: Yes Sensation Testing Intact to touch and monofilament testing bilaterally: Yes Pulse Check Posterior Tibialis and Dorsalis pulse intact bilaterally: Yes Comments        Assessment & Plan:   Problem List Items Addressed This Visit       Unprioritized   Vitamin D deficiency   Relevant Orders   Vitamin D (25 hydroxy)   Type 2 diabetes mellitus without complication, without long-term current use of insulin (HCC)    Lab Results  Component Value Date   HGBA1C 5.7 12/08/2021   HGBA1C 5.9 09/08/2021   HGBA1C 6.7 (H) 06/09/2021   Lab Results  Component Value Date   MICROALBUR 0.4 05/21/2016   LDLCALC 157 (H) 04/29/2020   CREATININE 0.75 09/08/2021  Continues  mounjaro.  At [place].       Relevant Medications   tirzepatide [facility]) 15 MG/0.5ML Pen   Other Relevant Orders   Basic Metabolic Panel (BMET)   Primary hypertension    BP Readings from Last 3 Encounters:  03/12/22 134/77  12/08/21 135/71  09/08/21 135/71        Hyperlipidemia    Lab Results  Component Value Date   CHOL 250 (H) 09/08/2021   HDL 46.30 09/08/2021   LDLCALC 157 (H) 04/29/2020   LDLDIRECT 177.0 09/08/2021   TRIG 220.0 (H) 09/08/2021   CHOLHDL 5 09/08/2021  On crestor. Check lipids       Relevant Orders   Lipid panel   Elevated TSH   Relevant Orders   TSH   B12 deficiency   Relevant Orders   B12   Acute pain of left shoulder - Primary    She had brief improvement with steroids.  But pain has returned and she is quite uncomfortable. Will refill robaxin as this has been helpful, add meloxicam and refer to sports medicine for further evaluation.       Relevant Medications   methocarbamol (ROBAXIN) 500 MG tablet   meloxicam (MOBIC) 15 MG tablet   Other Relevant Orders   Ambulatory referral to Sports Medicine   Meds ordered this encounter  Medications   methocarbamol (ROBAXIN) 500 MG tablet    Sig: Take 1 tablet (500 mg total) by mouth every 8 (eight) hours as needed for muscle spasms.    Dispense:  20 tablet    Refill:  0    Order Specific Question:   Supervising Provider    Answer:   Penni Homans A [[id number]]   meloxicam (MOBIC) 15 MG tablet    Sig: Take 1 tablet (15 mg total) by mouth daily.    Dispense:  30 tablet    Refill:  0    Order Specific Question:   Supervising Provider    Answer:   Penni Homans A [[id number]]   tirzepatide (MOUNJARO) 15 MG/0.5ML Pen    Sig: Inject 15 mg into the skin once a week.    Dispense:  6 mL    Refill:  1    Order Specific Question:   Supervising Provider    Answer:   Penni Homans A [[id number]]    I, Nance Pear, NP, personally preformed the services described in this documentation.  All medical record  entries made by the scribe were at my direction and in my presence.  I have reviewed the chart and discharge instructions (if  applicable) and agree that the record reflects my personal performance and is accurate and complete. 03/12/2022   I,Amber Collins,acting as a scribe for Nance Pear, NP.,have documented all relevant documentation on the behalf of Nance Pear, NP,as directed by  Nance Pear, NP while in the presence of Nance Pear, NP.    Nance Pear, NP

## 2022-03-12 NOTE — Assessment & Plan Note (Signed)
Lab Results  Component Value Date   CHOL 250 (H) 09/08/2021   HDL 46.30 09/08/2021   LDLCALC 157 (H) 04/29/2020   LDLDIRECT 177.0 09/08/2021   TRIG 220.0 (H) 09/08/2021   CHOLHDL 5 09/08/2021   On crestor. Check lipids

## 2022-03-12 NOTE — Assessment & Plan Note (Signed)
BP Readings from Last 3 Encounters:  03/12/22 134/77  12/08/21 135/71  09/08/21 135/71

## 2022-03-12 NOTE — Assessment & Plan Note (Signed)
She had brief improvement with steroids.  But pain has returned and she is quite uncomfortable. Will refill robaxin as this has been helpful, add meloxicam and refer to sports medicine for further evaluation.

## 2022-03-12 NOTE — Assessment & Plan Note (Signed)
Lab Results  Component Value Date   HGBA1C 5.7 12/08/2021   HGBA1C 5.9 09/08/2021   HGBA1C 6.7 (H) 06/09/2021   Lab Results  Component Value Date   MICROALBUR 0.4 05/21/2016   LDLCALC 157 (H) 04/29/2020   CREATININE 0.75 09/08/2021   Continues mounjaro.  At Goal.

## 2022-03-14 ENCOUNTER — Telehealth: Payer: Self-pay | Admitting: Family

## 2022-03-14 DIAGNOSIS — E559 Vitamin D deficiency, unspecified: Secondary | ICD-10-CM

## 2022-03-14 DIAGNOSIS — E538 Deficiency of other specified B group vitamins: Secondary | ICD-10-CM

## 2022-03-14 MED ORDER — VITAMIN D (ERGOCALCIFEROL) 1.25 MG (50000 UNIT) PO CAPS
50000.0000 [IU] | ORAL_CAPSULE | ORAL | 0 refills | Status: DC
  Filled 2022-03-14: qty 12, 84d supply, fill #0

## 2022-03-14 NOTE — Telephone Encounter (Signed)
See mychart.  

## 2022-03-15 ENCOUNTER — Other Ambulatory Visit (HOSPITAL_BASED_OUTPATIENT_CLINIC_OR_DEPARTMENT_OTHER): Payer: Self-pay

## 2022-03-15 ENCOUNTER — Ambulatory Visit: Payer: No Typology Code available for payment source | Admitting: Family Medicine

## 2022-03-15 ENCOUNTER — Other Ambulatory Visit: Payer: Self-pay | Admitting: Family

## 2022-03-15 ENCOUNTER — Encounter: Payer: Self-pay | Admitting: Family Medicine

## 2022-03-15 ENCOUNTER — Other Ambulatory Visit: Payer: Self-pay

## 2022-03-15 ENCOUNTER — Ambulatory Visit: Payer: Self-pay

## 2022-03-15 VITALS — BP 122/70 | Ht 64.0 in | Wt 200.0 lb

## 2022-03-15 DIAGNOSIS — E538 Deficiency of other specified B group vitamins: Secondary | ICD-10-CM

## 2022-03-15 DIAGNOSIS — M778 Other enthesopathies, not elsewhere classified: Secondary | ICD-10-CM

## 2022-03-15 MED ORDER — TRIAMCINOLONE ACETONIDE 40 MG/ML IJ SUSP
40.0000 mg | Freq: Once | INTRAMUSCULAR | Status: AC
  Administered 2022-03-15: 40 mg via INTRA_ARTICULAR

## 2022-03-15 MED ORDER — CYANOCOBALAMIN 1000 MCG/ML IJ SOLN
1000.0000 ug | INTRAMUSCULAR | 4 refills | Status: DC
  Filled 2022-03-15: qty 6, 84d supply, fill #0
  Filled 2022-05-26: qty 6, 84d supply, fill #1

## 2022-03-15 MED ORDER — VITAMIN D (ERGOCALCIFEROL) 1.25 MG (50000 UNIT) PO CAPS
50000.0000 [IU] | ORAL_CAPSULE | ORAL | 0 refills | Status: DC
  Filled 2022-03-15: qty 12, 84d supply, fill #0

## 2022-03-15 NOTE — Assessment & Plan Note (Signed)
Acutely occurring.  Symptoms most consistent with a capsulitis.  Has limited external rotation on exam. -Counseled on home exercise therapy and supportive care. -Injection today. -Could consider Hydro dilation, physical therapy or further imaging.

## 2022-03-15 NOTE — Progress Notes (Signed)
  Shelley Garcia - 56 y.o. female MRN [id number]  Date of birth: 02-16-1966  SUBJECTIVE:  Including CC & ROS.  No chief complaint on file.   Shelley Garcia is a 56 y.o. female that is presenting with acute left shoulder pain.  The pain has been ongoing for a few weeks.  Has tried medications with limited improvement.  Continues to have limited range of motion.    Review of Systems See HPI   HISTORY: Past Medical, Surgical, Social, and Family History Reviewed & Updated per EMR.   Pertinent Historical Findings include:  Past Medical History:  Diagnosis Date   B12 deficiency 02/12/2015   Back pain    Cervical spine pain 09/01/2015   Depression with anxiety 07/29/2013   Diabetes mellitus    Diabetes mellitus type 2 with complications, uncontrolled 10/06/2010   Diagnosed with Type II DM in ~ 2008; pt does not tolerate Metformin (GI upset). Has been on Lantus since 2011. Vision assessment every 2 years- Optometry.    Dyspnea on exertion 10/12/2016   Elevated TSH 03/02/2017   Esophageal reflux 10/06/2010   Essential hypertension 11/23/2016   Gallbladder problem    GERD (gastroesophageal reflux disease)    History of chicken pox    History of migraine headaches 05/17/2012   Hyperlipidemia    Hypertension    Joint pain    Nonallopathic lesion of cervical region 11/18/2015   Nonallopathic lesion of lumbosacral region 11/18/2015   Nonallopathic lesion of thoracic region 11/18/2015   Obesity    Other fatigue 10/12/2016   Other hyperlipidemia 03/02/2017   Trapezius strain 02/12/2015   Type 2 diabetes mellitus without complication, without long-term current use of insulin ([facility]) 10/12/2016   Vitamin B12 deficiency    Vitamin D deficiency     Past Surgical History:  Procedure Laterality Date   CHOLECYSTECTOMY     EXPLORATORY LAPAROTOMY     KIDNEY STONE SURGERY     WISDOM TOOTH EXTRACTION  07/31/2013     PHYSICAL EXAM:  VS: BP 122/70   Ht '5\' 4"'$  (1.626 m)   Wt 200 lb (90.7 kg)   BMI 34.33  kg/m  Physical Exam Gen: NAD, alert, cooperative with exam, well-appearing MSK:  Neurovascularly intact     Aspiration/Injection Procedure Note Shelley Garcia 03/31/1965  Procedure: Injection Indications: Left shoulder pain  Procedure Details Consent: Risks of procedure as well as the alternatives and risks of each were explained to the (patient/caregiver).  Consent for procedure obtained. Time Out: Verified patient identification, verified procedure, site/side was marked, verified correct patient position, special equipment/implants available, medications/allergies/relevent history reviewed, required imaging and test results available.  Performed.  The area was cleaned with iodine and alcohol swabs.    The left glenohumeral joint was injected with 1 cc of 1% lidocaine on a 22-gauge 3-1/2 inch needle.  The syringe was switched to mixture containing 3 cc of 1% lidocaine, 1 cc of 40 mg Kenalog and 4 cc of 0.25% bupivacaine.  Ultrasound was used. Images were obtained in short views showing the injection.     A sterile dressing was applied.  Patient did tolerate procedure well.     ASSESSMENT & PLAN:   Capsulitis of left shoulder Acutely occurring.  Symptoms most consistent with a capsulitis.  Has limited external rotation on exam. -Counseled on home exercise therapy and supportive care. -Injection today. -Could consider Hydro dilation, physical therapy or further imaging.

## 2022-03-15 NOTE — Patient Instructions (Signed)
Nice to meet you Please try heat before exercise and ice after Please try the exercises   Please send me a message in MyChart with any questions or updates.  Please see me back in 4 weeks.   --Dr.   

## 2022-03-15 NOTE — Addendum Note (Signed)
Addended by: Debbrah Alar on: 03/15/2022 01:15 PM   Modules accepted: Orders

## 2022-03-30 ENCOUNTER — Other Ambulatory Visit (HOSPITAL_COMMUNITY): Payer: Self-pay

## 2022-03-31 ENCOUNTER — Other Ambulatory Visit: Payer: Self-pay

## 2022-04-01 ENCOUNTER — Other Ambulatory Visit (HOSPITAL_COMMUNITY): Payer: Self-pay

## 2022-04-13 ENCOUNTER — Ambulatory Visit: Payer: 59 | Admitting: Family Medicine

## 2022-05-02 ENCOUNTER — Other Ambulatory Visit: Payer: Self-pay

## 2022-05-03 ENCOUNTER — Other Ambulatory Visit: Payer: Self-pay

## 2022-05-04 ENCOUNTER — Other Ambulatory Visit: Payer: Self-pay

## 2022-05-04 ENCOUNTER — Ambulatory Visit (INDEPENDENT_AMBULATORY_CARE_PROVIDER_SITE_OTHER): Payer: 59 | Admitting: Family

## 2022-05-04 ENCOUNTER — Encounter: Payer: Self-pay | Admitting: Family

## 2022-05-04 VITALS — BP 124/80 | Temp 79.0°F | Ht 64.0 in | Wt 198.1 lb

## 2022-05-04 DIAGNOSIS — E119 Type 2 diabetes mellitus without complications: Secondary | ICD-10-CM | POA: Diagnosis not present

## 2022-05-04 DIAGNOSIS — Z8639 Personal history of other endocrine, nutritional and metabolic disease: Secondary | ICD-10-CM

## 2022-05-04 DIAGNOSIS — E559 Vitamin D deficiency, unspecified: Secondary | ICD-10-CM | POA: Diagnosis not present

## 2022-05-04 DIAGNOSIS — E538 Deficiency of other specified B group vitamins: Secondary | ICD-10-CM | POA: Diagnosis not present

## 2022-05-04 DIAGNOSIS — E785 Hyperlipidemia, unspecified: Secondary | ICD-10-CM

## 2022-05-04 DIAGNOSIS — Z23 Encounter for immunization: Secondary | ICD-10-CM | POA: Diagnosis not present

## 2022-05-04 LAB — POCT GLYCOSYLATED HEMOGLOBIN (HGB A1C): Hemoglobin A1C: 5.3 % (ref 4.0–5.6)

## 2022-05-04 LAB — B12 AND FOLATE PANEL
Folate: 6.7 ng/mL (ref >5.9)
Vitamin B-12: 180 pg/mL — ABNORMAL LOW (ref 211–911)

## 2022-05-04 LAB — VITAMIN D 25 HYDROXY (VIT D DEFICIENCY, FRACTURES): VITD: 16.83 ng/mL — ABNORMAL LOW (ref 30.00–100.00)

## 2022-05-04 MED ORDER — EZETIMIBE 10 MG PO TABS
10.0000 mg | ORAL_TABLET | Freq: Every day | ORAL | 3 refills | Status: DC
  Filled 2022-05-04: qty 90, 90d supply, fill #0

## 2022-05-04 NOTE — Progress Notes (Signed)
Assessment & Plan:  Need for shingles vaccine -     Varicella-zoster vaccine IM  History of vitamin D deficiency -     VITAMIN D 25 Hydroxy (Vit-D Deficiency, Fractures)  Type 2 diabetes mellitus without complication, without long-term current use of insulin [facility]) Assessment & Plan: Lab Results  Component Value Date   HGBA1C 5.3 05/04/2022   Excellent control.  Continue Mounjaro 15 mg  Orders: -     Microalbumin / creatinine urine ratio -     POCT glycosylated hemoglobin (Hb A1C) -     Ezetimibe; Take 1 tablet (10 mg total) by mouth daily.  Dispense: 90 tablet; Refill: 3  B12 deficiency Assessment & Plan: Longstanding history of B12 deficiency.  Pending labs for celiac disease, pernicious anemia.  Continue B12 injections weekly for now.  Consider GI consult if B12 is not responding.   Orders: -     Intrinsic Factor Antibodies -     Homocysteine -     Methylmalonic acid, serum -     Celiac Disease Ab Screen w/Rfx -     B12 and Folate Panel  Vitamin D deficiency Assessment & Plan: She has been taking vitamin D 50,000 unit weekly for months now.  Pending vitamin D   Hyperlipidemia, unspecified hyperlipidemia type Assessment & Plan: Lab Results  Component Value Date   LDLCALC 133 (H) 03/12/2022   Discussed LDL not at goal less than 70 in setting of diabetes.  Reports previous history of myalgias on Crestor.  We opted to add Zetia 10 mg to Crestor 20 mg.  Recheck lipid panel in a few months      Return precautions given.   Risks, benefits, and alternatives of the medications and treatment plan prescribed today were discussed, and patient expressed understanding.   Education regarding symptom management and diagnosis given to patient on AVS either electronically or printed.  Return in about 3 months (around 08/02/2022).  Mable Paris, FNP  Subjective:    Patient ID: Starling Manns, female    DOB: Aug 26, 1965, 57 y.o.   MRN: [id number]  CC: DOROTHY POLHEMUS  is a 57 y.o. female who presents today to establish care.    HPI: Transfer of care from [place] well today.  No new complaints     Former smoker and now vaping cigarettes. Overdue for Pap smear, mammogram colonoscopy.  She does not screen for breast cancer cervical cancer or colon cancer.  This is personal preference as she would not treat cancer.  She has a family history of colon cancer, colon polyps.  B12 deficiency - taking b12 weekly for 3 months.  She is not vegetarian.  No history of gastric surgery  Compliant vitamin d50,000 units once weekly for several months.   Allergies: Metformin and related and Codeine Current Outpatient Medications on File Prior to Visit  Medication Sig Dispense Refill   Blood Glucose Monitoring Suppl (FREESTYLE LITE) DEVI Use as instructed 2 to 3 times daily to check blood sugar.  Dx E11.8 1 each 0   cyanocobalamin (VITAMIN B12) 1000 MCG/ML injection Inject 1 mL (1,000 mcg total) into the muscle every 14 (fourteen) days. 6 mL 4   glucose blood (FREESTYLE LITE) test strip Use as instructed to check blood sugar 2-3 times a day.  Dx  E11.8 100 each 5   Lancets (FREESTYLE) lancets Use to check blood sugar 2-3 times daily.  Dx E11.8 100 each 5   lisinopril (ZESTRIL) 10 MG tablet Take  1 tablet (10 mg total) by mouth daily. 90 tablet 3   meloxicam (MOBIC) 15 MG tablet Take 1 tablet (15 mg total) by mouth daily. 30 tablet 0   NEEDLE, DISP, 18 G 18G X 1" MISC 1 each by Does not apply route every 7 (seven) days. 12 each 0   omeprazole (PRILOSEC) 40 MG capsule Take 1 capsule (40 mg total) by mouth daily. 90 capsule 1   rosuvastatin (CRESTOR) 20 MG tablet Take 1 tablet (20 mg total) by mouth daily. 90 tablet 0   SYRINGE-NEEDLE, DISP, 3 ML (LUER LOCK SAFETY SYRINGES) 23G X 1" 3 ML MISC Use as directed every 7 days 12 each 0   tirzepatide (MOUNJARO) 15 MG/0.5ML Pen Inject 15 mg into the skin once a week. 6 mL 1   Vitamin D, Ergocalciferol, (DRISDOL)  1.25 MG (50000 UNIT) CAPS capsule Take 1 capsule (50,000 Units total) by mouth every 7 (seven) days. 12 capsule 0   No current facility-administered medications on file prior to visit.    Review of Systems  Constitutional:  Negative for chills and fever.  Respiratory:  Negative for cough.   Cardiovascular:  Negative for chest pain and palpitations.  Gastrointestinal:  Negative for nausea and vomiting.      Objective:    BP 124/80   Temp (!) 79 F (26.1 C) (Oral)   Ht '5\' 4"'$  (1.626 m)   Wt 198 lb 1.6 oz (89.9 kg)   LMP  (LMP Unknown)   SpO2 98%   BMI 34.00 kg/m  BP Readings from Last 3 Encounters:  05/04/22 124/80  03/15/22 122/70  03/12/22 134/77   Wt Readings from Last 3 Encounters:  05/04/22 198 lb 1.6 oz (89.9 kg)  03/15/22 200 lb (90.7 kg)  03/12/22 200 lb 12.8 oz (91.1 kg)    Physical Exam Vitals reviewed.  Constitutional:      Appearance: She is well-developed.  Eyes:     Conjunctiva/sclera: Conjunctivae normal.  Cardiovascular:     Rate and Rhythm: Normal rate and regular rhythm.     Pulses: Normal pulses.     Heart sounds: Normal heart sounds.  Pulmonary:     Effort: Pulmonary effort is normal.     Breath sounds: Normal breath sounds. No wheezing, rhonchi or rales.  Skin:    General: Skin is warm and dry.  Neurological:     Mental Status: She is alert.  Psychiatric:        Speech: Speech normal.        Behavior: Behavior normal.        Thought Content: Thought content normal.

## 2022-05-04 NOTE — Assessment & Plan Note (Signed)
Lab Results  Component Value Date   LDLCALC 133 (H) 03/12/2022   Discussed LDL not at goal less than 70 in setting of diabetes.  Reports previous history of myalgias on Crestor.  We opted to add Zetia 10 mg to Crestor 20 mg.  Recheck lipid panel in a few months

## 2022-05-04 NOTE — Patient Instructions (Signed)
Start zetia  Nice to meet you!

## 2022-05-04 NOTE — Addendum Note (Signed)
Addended by: Neta Ehlers on: 05/04/2022 09:52 AM   Modules accepted: Orders

## 2022-05-04 NOTE — Assessment & Plan Note (Signed)
Lab Results  Component Value Date   HGBA1C 5.3 05/04/2022   Excellent control.  Continue Mounjaro 15 mg

## 2022-05-04 NOTE — Assessment & Plan Note (Signed)
She has been taking vitamin D 50,000 unit weekly for months now.  Pending vitamin D

## 2022-05-04 NOTE — Assessment & Plan Note (Signed)
Longstanding history of B12 deficiency.  Pending labs for celiac disease, pernicious anemia.  Continue B12 injections weekly for now.  Consider GI consult if B12 is not responding.

## 2022-05-04 NOTE — Assessment & Plan Note (Signed)
Chronic, stable. Continue lisinopril 10mg daily. 

## 2022-05-06 LAB — CELIAC DISEASE AB SCREEN W/RFX
Antigliadin Abs, IgA: 12 units (ref 0–19)
IgA/Immunoglobulin A, Serum: 340 mg/dL (ref 87–352)
Transglutaminase IgA: <2 U/mL (ref 0–3)

## 2022-05-07 ENCOUNTER — Encounter: Payer: Self-pay | Admitting: Family

## 2022-05-07 LAB — INTRINSIC FACTOR ANTIBODIES: Intrinsic Factor: NEGATIVE

## 2022-05-07 LAB — HOMOCYSTEINE: Homocysteine: 16.8 umol/L — ABNORMAL HIGH (ref <10.4)

## 2022-05-07 LAB — METHYLMALONIC ACID, SERUM: Methylmalonic Acid, Quant: 159 nmol/L (ref 87–318)

## 2022-05-12 ENCOUNTER — Other Ambulatory Visit: Payer: Self-pay | Admitting: Family

## 2022-05-12 DIAGNOSIS — E538 Deficiency of other specified B group vitamins: Secondary | ICD-10-CM

## 2022-05-12 DIAGNOSIS — E559 Vitamin D deficiency, unspecified: Secondary | ICD-10-CM

## 2022-05-19 ENCOUNTER — Telehealth: Payer: Self-pay

## 2022-05-19 NOTE — Telephone Encounter (Signed)
Patient states she is returning call from Denita Lung, [place].  I read Mable Paris, FNP's message to patient and scheduled her lab appointment for 07/28/2022.

## 2022-05-19 NOTE — Telephone Encounter (Signed)
LM for pt to cb    [place], Vitamin D has improved from prior.  I would like for you to complete the 12-week course of vitamin D as prescribed per [name].  We can recheck vitamin D at follow-up in May.  I know that you can take 4 to B12 injections every 2 weeks.  I would like to get a B12 a couple more months.  Lets plan to recheck in May as well.  If B12 is not responding, I will likely refer you to gastroenterology Negative pernicious anemia, celiac disease.   I have ordered vitamin D and B12.  Please call to schedule these labs 2 or 3 days ahead of your May appointment with me.  These labs are non fasting .   Regards, Joycelyn Schmid

## 2022-05-20 ENCOUNTER — Telehealth: Payer: Self-pay

## 2022-05-20 NOTE — Telephone Encounter (Signed)
LVM to call back to go over results

## 2022-05-24 ENCOUNTER — Telehealth: Payer: Self-pay

## 2022-05-24 NOTE — Telephone Encounter (Signed)
LVM to call back to office to go over results

## 2022-05-26 ENCOUNTER — Other Ambulatory Visit: Payer: Self-pay

## 2022-05-26 ENCOUNTER — Other Ambulatory Visit: Payer: Self-pay | Admitting: Family

## 2022-05-31 ENCOUNTER — Other Ambulatory Visit: Payer: Self-pay

## 2022-06-09 ENCOUNTER — Ambulatory Visit: Payer: 59 | Admitting: Family

## 2022-06-17 DIAGNOSIS — H52223 Regular astigmatism, bilateral: Secondary | ICD-10-CM | POA: Diagnosis not present

## 2022-06-17 DIAGNOSIS — H524 Presbyopia: Secondary | ICD-10-CM | POA: Diagnosis not present

## 2022-06-17 DIAGNOSIS — E119 Type 2 diabetes mellitus without complications: Secondary | ICD-10-CM | POA: Diagnosis not present

## 2022-06-25 ENCOUNTER — Other Ambulatory Visit: Payer: Self-pay | Admitting: Family

## 2022-06-25 ENCOUNTER — Other Ambulatory Visit: Payer: Self-pay

## 2022-06-26 ENCOUNTER — Other Ambulatory Visit (HOSPITAL_COMMUNITY): Payer: Self-pay

## 2022-06-26 MED ORDER — ROSUVASTATIN CALCIUM 20 MG PO TABS
20.0000 mg | ORAL_TABLET | Freq: Every day | ORAL | 0 refills | Status: DC
  Filled 2022-06-26: qty 90, 90d supply, fill #0

## 2022-06-28 ENCOUNTER — Encounter: Payer: Self-pay | Admitting: Family

## 2022-06-28 ENCOUNTER — Other Ambulatory Visit (HOSPITAL_COMMUNITY): Payer: Self-pay

## 2022-06-28 ENCOUNTER — Other Ambulatory Visit: Payer: Self-pay

## 2022-06-29 ENCOUNTER — Other Ambulatory Visit: Payer: Self-pay | Admitting: Family

## 2022-06-29 DIAGNOSIS — E119 Type 2 diabetes mellitus without complications: Secondary | ICD-10-CM

## 2022-06-29 MED ORDER — OZEMPIC (1 MG/DOSE) 2 MG/1.5ML ~~LOC~~ SOPN: 2.0000 mg | PEN_INJECTOR | SUBCUTANEOUS | 3 refills | Status: DC

## 2022-06-30 ENCOUNTER — Other Ambulatory Visit (HOSPITAL_COMMUNITY): Payer: Self-pay

## 2022-06-30 ENCOUNTER — Other Ambulatory Visit: Payer: Self-pay | Admitting: Family

## 2022-06-30 DIAGNOSIS — E119 Type 2 diabetes mellitus without complications: Secondary | ICD-10-CM

## 2022-06-30 MED ORDER — SEMAGLUTIDE (2 MG/DOSE) 8 MG/3ML ~~LOC~~ SOPN
2.0000 mg | PEN_INJECTOR | SUBCUTANEOUS | 3 refills | Status: DC
  Filled 2022-06-30: qty 3, 28d supply, fill #0

## 2022-07-01 ENCOUNTER — Other Ambulatory Visit (HOSPITAL_COMMUNITY): Payer: Self-pay

## 2022-07-01 ENCOUNTER — Other Ambulatory Visit: Payer: Self-pay

## 2022-07-02 ENCOUNTER — Other Ambulatory Visit: Payer: Self-pay

## 2022-07-02 ENCOUNTER — Telehealth: Payer: Self-pay

## 2022-07-02 ENCOUNTER — Other Ambulatory Visit (HOSPITAL_COMMUNITY): Payer: Self-pay

## 2022-07-02 DIAGNOSIS — E119 Type 2 diabetes mellitus without complications: Secondary | ICD-10-CM

## 2022-07-02 MED ORDER — SEMAGLUTIDE (2 MG/DOSE) 8 MG/3ML ~~LOC~~ SOPN
2.0000 mg | PEN_INJECTOR | SUBCUTANEOUS | 3 refills | Status: DC
  Filled 2022-07-02: qty 3, 28d supply, fill #0
  Filled 2022-08-03: qty 3, 28d supply, fill #1
  Filled 2022-09-01: qty 3, 28d supply, fill #2
  Filled 2022-09-28: qty 3, 28d supply, fill #3

## 2022-07-02 MED ORDER — SEMAGLUTIDE (2 MG/DOSE) 8 MG/3ML ~~LOC~~ SOPN: 2.0000 mg | PEN_INJECTOR | SUBCUTANEOUS | 3 refills | Status: DC

## 2022-07-02 NOTE — Telephone Encounter (Signed)
LVM to call back to ask about what dosage of Mounjaro she is taking.

## 2022-07-02 NOTE — Telephone Encounter (Signed)
Pt called back and I read the message and she stated the mounjaro was on back order so arnett called in ozempic. She is on 15mg  for [employer]

## 2022-07-06 NOTE — Telephone Encounter (Signed)
noted 

## 2022-07-12 ENCOUNTER — Encounter: Payer: Self-pay | Admitting: *Deleted

## 2022-07-28 ENCOUNTER — Other Ambulatory Visit: Payer: 59

## 2022-08-03 ENCOUNTER — Ambulatory Visit: Payer: 59 | Admitting: Family

## 2022-08-03 ENCOUNTER — Other Ambulatory Visit: Payer: Self-pay

## 2022-09-01 ENCOUNTER — Other Ambulatory Visit (HOSPITAL_COMMUNITY): Payer: Self-pay

## 2022-09-28 ENCOUNTER — Other Ambulatory Visit (HOSPITAL_COMMUNITY): Payer: Self-pay

## 2022-10-05 ENCOUNTER — Telehealth: Payer: Self-pay | Admitting: Family

## 2022-10-05 NOTE — Telephone Encounter (Signed)
Patient if requesting a referral to [facility] sports medicine. Left shoulder pain and needs another injection.

## 2022-10-06 ENCOUNTER — Other Ambulatory Visit: Payer: Self-pay

## 2022-10-06 DIAGNOSIS — M25629 Stiffness of unspecified elbow, not elsewhere classified: Secondary | ICD-10-CM

## 2022-10-06 NOTE — Telephone Encounter (Signed)
Please place referral [facility] sports medicine  Reason: Left shoulder pain and needs another injection.

## 2022-10-06 NOTE — Telephone Encounter (Signed)
DONE

## 2022-10-06 NOTE — Progress Notes (Signed)
Orders only

## 2022-10-07 NOTE — Addendum Note (Signed)
Addended by: [place],  on: 10/07/2022 10:41 AM   Modules accepted: Orders

## 2022-10-20 ENCOUNTER — Ambulatory Visit: Payer: 59 | Admitting: Sports Medicine

## 2022-10-20 ENCOUNTER — Encounter: Payer: Self-pay | Admitting: Sports Medicine

## 2022-10-20 ENCOUNTER — Other Ambulatory Visit: Payer: Self-pay

## 2022-10-20 ENCOUNTER — Encounter: Payer: Self-pay | Admitting: Family

## 2022-10-20 ENCOUNTER — Other Ambulatory Visit: Payer: Self-pay | Admitting: Family

## 2022-10-20 ENCOUNTER — Other Ambulatory Visit (HOSPITAL_COMMUNITY): Payer: Self-pay

## 2022-10-20 VITALS — BP 132/78 | Ht 64.0 in | Wt 210.0 lb

## 2022-10-20 DIAGNOSIS — M7502 Adhesive capsulitis of left shoulder: Secondary | ICD-10-CM

## 2022-10-20 DIAGNOSIS — M778 Other enthesopathies, not elsewhere classified: Secondary | ICD-10-CM

## 2022-10-20 DIAGNOSIS — E119 Type 2 diabetes mellitus without complications: Secondary | ICD-10-CM

## 2022-10-20 MED ORDER — TIRZEPATIDE 10 MG/0.5ML ~~LOC~~ SOAJ: 10.0000 mg | SUBCUTANEOUS | 2 refills | Status: DC

## 2022-10-20 MED ORDER — METHYLPREDNISOLONE ACETATE 40 MG/ML IJ SUSP
40.0000 mg | Freq: Once | INTRAMUSCULAR | Status: AC
Start: 2022-10-20 — End: 2022-10-20
  Administered 2022-10-20: 40 mg via INTRA_ARTICULAR

## 2022-10-20 MED ORDER — AMITRIPTYLINE HCL 25 MG PO TABS
25.0000 mg | ORAL_TABLET | Freq: Every day | ORAL | 1 refills | Status: DC
  Filled 2022-10-20: qty 30, 30d supply, fill #0

## 2022-10-20 NOTE — Assessment & Plan Note (Signed)
This is a recurrent bout of adhesive capsulitis Her diabetes mellitus makes her at greater risk although she is doing activities that may also aggravate the shoulder  I think she might benefit from medication as well as injections as needed Begin Codman exercises Recheck in 1 month and repeat injection if needed  Monitor sugars carefully after injection

## 2022-10-20 NOTE — Progress Notes (Addendum)
PCP: Allegra Grana, FNP  Subjective:   HPI: Patient is a 57 y.o. female here for Left-sided shoulder pain as well as difficulty moving.  Patient has a history of adhesive capsulitis last diagnosed in the left shoulder in December of last year.  Patient was seen by sports medicine at [facility] and at that time had a steroid injection into her shoulder.  Patient states that a few days after that she had full relief and was able to go about her day.  Patient states that her symptoms started At approximately 1 month ago.  Patient states that her symptoms are bad right now and wanted to get ahead of the curve before it got worse.  He has diabetes mellitus but has had excellent control with her glycohemoglobin less than 6.   Past Medical History:  Diagnosis Date   B12 deficiency 02/12/2015   Back pain    Cervical spine pain 09/01/2015   Depression with anxiety 07/29/2013   Diabetes mellitus    Diabetes mellitus type 2 with complications, uncontrolled 10/06/2010   Diagnosed with Type II DM in ~ 2008; pt does not tolerate Metformin (GI upset). Has been on Lantus since 2011. Vision assessment every 2 years- Optometry.    Dyspnea on exertion 10/12/2016   Elevated TSH 03/02/2017   Esophageal reflux 10/06/2010   Essential hypertension 11/23/2016   Gallbladder problem    GERD (gastroesophageal reflux disease)    History of chicken pox    History of migraine headaches 05/17/2012   Hyperlipidemia    Hypertension    Joint pain    Nonallopathic lesion of cervical region 11/18/2015   Nonallopathic lesion of lumbosacral region 11/18/2015   Nonallopathic lesion of thoracic region 11/18/2015   Obesity    Other fatigue 10/12/2016   Other hyperlipidemia 03/02/2017   Trapezius strain 02/12/2015   Type 2 diabetes mellitus without complication, without long-term current use of insulin ([facility]) 10/12/2016   Vitamin B12 deficiency    Vitamin D deficiency     Current Outpatient Medications on File Prior to Visit   Medication Sig Dispense Refill   Blood Glucose Monitoring Suppl (FREESTYLE LITE) DEVI Use as instructed 2 to 3 times daily to check blood sugar.  Dx E11.8 1 each 0   cyanocobalamin (VITAMIN B12) 1000 MCG/ML injection Inject 1 mL (1,000 mcg total) into the muscle every 14 (fourteen) days. 6 mL 4   ezetimibe (ZETIA) 10 MG tablet Take 1 tablet (10 mg total) by mouth daily. 90 tablet 3   glucose blood (FREESTYLE LITE) test strip Use as instructed to check blood sugar 2-3 times a day.  Dx  E11.8 100 each 5   Lancets (FREESTYLE) lancets Use to check blood sugar 2-3 times daily.  Dx E11.8 100 each 5   lisinopril (ZESTRIL) 10 MG tablet Take 1 tablet (10 mg total) by mouth daily. 90 tablet 3   meloxicam (MOBIC) 15 MG tablet Take 1 tablet (15 mg total) by mouth daily. 30 tablet 0   NEEDLE, DISP, 18 G 18G X 1" MISC 1 each by Does not apply route every 7 (seven) days. 12 each 0   omeprazole (PRILOSEC) 40 MG capsule Take 1 capsule (40 mg total) by mouth daily. 90 capsule 1   rosuvastatin (CRESTOR) 20 MG tablet Take 1 tablet (20 mg total) by mouth daily. 90 tablet 0   Semaglutide, 2 MG/DOSE, 8 MG/3ML SOPN Inject 2 mg into the skin once a week. 12 mL 3   SYRINGE-NEEDLE, DISP, 3  ML (LUER LOCK SAFETY SYRINGES) 23G X 1" 3 ML MISC Use as directed every 7 days 12 each 0   Vitamin D, Ergocalciferol, (DRISDOL) 1.25 MG (50000 UNIT) CAPS capsule Take 1 capsule (50,000 Units total) by mouth every 7 (seven) days. 12 capsule 0   No current facility-administered medications on file prior to visit.    Past Surgical History:  Procedure Laterality Date   CHOLECYSTECTOMY  1989   EXPLORATORY LAPAROTOMY     KIDNEY STONE SURGERY     WISDOM TOOTH EXTRACTION  07/31/2013    Allergies  Allergen Reactions   Metformin And Related Nausea And Vomiting    Made pt really sick   Codeine Rash    BP (!) 158/73   Ht 5\' 4"  (1.626 m)   Wt 210 lb (95.3 kg)   BMI 36.05 kg/m        Objective:  Physical Exam:  Gen: NAD,  comfortable in exam room  Shoulder, Left:. No  skin changes, erythema, or ecchymosis noted. No evidence of bony deformity, asymmetry, or muscle atrophy; No tenderness over long head of biceps (bicipital groove). Mild TTP at [facility] joint. Patient has decreased range of motion with external rotation and abduction.  Patient's pain is a limiting factor.  Causes decreased range of motion in all other planes. Thumb to T12 unable to do. Strength is decreased throughout left shoulder due to pain.     MSK Complete US of Shoulder, Left  Patient was seated on exam table and shoulder US examination was performed using high frequency linear probe.  The biceps tendon was visualized within the bicipital groove in both longitudinal and transverse axis with no signs of fluid or hypoechoic changes. Tendon fibers intact without signs of irregularity.  The subscapularis tendon was visualized in both longitudinal and transverse axis with intact tendon inserting at the inferior lesser tubercle of the humerus. No signs of tear, no hypoechoic changes or tissue irregularity were seen.  The supraspinatus tendon was visualized in longitudinal, transverse, and dynamic views. No signs of tear with the tendon inserting at the superior facet of the greater tubercle of the humerus, no hypoechoic changes or tissue irregularity seen.  The infraspinatus and teres minor tendons were visualized in both longitudinal and transverse axis with tendon insertion at the middle facet of the great tubercle of the humerus with no signs of tearing, hypoechoic changes or tissue irregularity seen.  The posterior glenohumeral joint was visualized with proper alignment.  The [facility] Joint was visualized without capsular distension and  there was noted arthritic changes with mushroom sign visualization   IMPRESSION: Overall, grossly normal complete U/S examination of the shoulder with AC joint arthritis. Tendons and musculature intact.   Assessment & Plan:  1.  Adhesive capsulitis of left shoulder - Patient symptoms as well as imaging point towards adhesive capsulitis being the primary pathology.  Patient would benefit from steroid injection as well as home therapy.  Patient agreed and steroid injection was given and patient tolerated procedure well. Would also recommend patient start Elavil 25 mg nightly.  Patient given at home exercises to continue.  I observed and examined the patient with the [facility] resident and agree with assessment and plan.  Note reviewed and modified by me. Sterling Big, MD

## 2022-10-26 ENCOUNTER — Other Ambulatory Visit: Payer: Self-pay

## 2022-10-26 ENCOUNTER — Other Ambulatory Visit (HOSPITAL_COMMUNITY): Payer: Self-pay

## 2022-10-26 ENCOUNTER — Telehealth: Payer: Self-pay

## 2022-10-26 DIAGNOSIS — E119 Type 2 diabetes mellitus without complications: Secondary | ICD-10-CM

## 2022-10-26 MED ORDER — TIRZEPATIDE 10 MG/0.5ML ~~LOC~~ SOAJ
10.0000 mg | SUBCUTANEOUS | 2 refills | Status: DC
Start: 2022-10-26 — End: 2023-02-15
  Filled 2022-10-26: qty 6, 84d supply, fill #0
  Filled 2022-10-29: qty 2, 28d supply, fill #0
  Filled 2022-11-24 – 2022-11-25 (×2): qty 2, 28d supply, fill #1
  Filled 2022-12-19: qty 2, 28d supply, fill #2
  Filled 2023-01-18: qty 2, 28d supply, fill #3

## 2022-10-26 NOTE — Telephone Encounter (Signed)
Patient states we sent her tirzepatide [facility]) 10 MG/0.5ML [facility] [place], but it needs to be sent to [facility] instead.    Patient is on her way to [facility] right now.  I spoke with Jenate [place], CMA.  Shelley Garcia states she will go ahead and send the medication to the [facility], but she is not sure how long it will take them to fill it.  I relayed message to patient.  I suggested patient call the pharmacy before she goes there to pick up the medication.

## 2022-10-27 NOTE — Telephone Encounter (Signed)
Sent in to correct pharmacy and pt is aware

## 2022-10-29 ENCOUNTER — Other Ambulatory Visit: Payer: Self-pay

## 2022-10-29 ENCOUNTER — Other Ambulatory Visit (HOSPITAL_COMMUNITY): Payer: Self-pay

## 2022-11-03 ENCOUNTER — Ambulatory Visit (INDEPENDENT_AMBULATORY_CARE_PROVIDER_SITE_OTHER): Payer: 59 | Admitting: Family

## 2022-11-03 ENCOUNTER — Encounter: Payer: Self-pay | Admitting: Family

## 2022-11-03 VITALS — BP 130/80 | HR 80 | Temp 97.7°F | Ht 64.0 in | Wt 203.6 lb

## 2022-11-03 DIAGNOSIS — Z7985 Long-term (current) use of injectable non-insulin antidiabetic drugs: Secondary | ICD-10-CM | POA: Diagnosis not present

## 2022-11-03 DIAGNOSIS — I10 Essential (primary) hypertension: Secondary | ICD-10-CM | POA: Diagnosis not present

## 2022-11-03 DIAGNOSIS — R7309 Other abnormal glucose: Secondary | ICD-10-CM

## 2022-11-03 DIAGNOSIS — E538 Deficiency of other specified B group vitamins: Secondary | ICD-10-CM | POA: Diagnosis not present

## 2022-11-03 DIAGNOSIS — Z0000 Encounter for general adult medical examination without abnormal findings: Secondary | ICD-10-CM | POA: Diagnosis not present

## 2022-11-03 DIAGNOSIS — E119 Type 2 diabetes mellitus without complications: Secondary | ICD-10-CM | POA: Diagnosis not present

## 2022-11-03 DIAGNOSIS — Z13220 Encounter for screening for lipoid disorders: Secondary | ICD-10-CM | POA: Diagnosis not present

## 2022-11-03 DIAGNOSIS — Z23 Encounter for immunization: Secondary | ICD-10-CM | POA: Diagnosis not present

## 2022-11-03 DIAGNOSIS — Z8639 Personal history of other endocrine, nutritional and metabolic disease: Secondary | ICD-10-CM | POA: Diagnosis not present

## 2022-11-03 LAB — POCT GLYCOSYLATED HEMOGLOBIN (HGB A1C): Hemoglobin A1C: 5.6 % (ref 4.0–5.6)

## 2022-11-03 NOTE — Assessment & Plan Note (Signed)
Lab Results  Component Value Date   HGBA1C 5.6 11/03/2022   Excellent control.  Continue Mounjaro 10mg  .

## 2022-11-03 NOTE — Assessment & Plan Note (Signed)
Chronic, stable. Continue lisinopril 10mg daily. 

## 2022-11-03 NOTE — Assessment & Plan Note (Signed)
Patient and I discussed preventative care including Pap smear, mammogram, colonoscopy and lung cancer screening program.  She politely declines screening at this time.  Shingrex 2/2 given.

## 2022-11-03 NOTE — Progress Notes (Signed)
Assessment & Plan:  Elevated glucose -     POCT glycosylated hemoglobin (Hb A1C)  Type 2 diabetes mellitus without complication, without long-term current use of insulin [facility]) Assessment & Plan: Lab Results  Component Value Date   HGBA1C 5.6 11/03/2022   Excellent control.  Continue Mounjaro 10mg  .  Orders: -     CBC with Differential/Platelet; Future -     Comprehensive metabolic panel; Future -     Lipid panel; Future -     Microalbumin / creatinine urine ratio; Future  Encounter for lipid screening for cardiovascular disease  History of vitamin D deficiency -     VITAMIN D 25 Hydroxy (Vit-D Deficiency, Fractures); Future  Primary hypertension Assessment & Plan: Chronic, stable.  Continue lisinopril 10 mg daily  Orders: -     TSH; Future  B12 deficiency -     B12 and Folate Panel; Future  Preventative health care Assessment & Plan: Patient and I discussed preventative care including Pap smear, mammogram, colonoscopy and lung cancer screening program.  She politely declines screening at this time.  Shingrex 2/2 given.    Need for shingles vaccine -     Varicella-zoster vaccine IM     Return precautions given.   Risks, benefits, and alternatives of the medications and treatment plan prescribed today were discussed, and patient expressed understanding.   Education regarding symptom management and diagnosis given to patient on AVS either electronically or printed.  Return in about 4 months (around 03/05/2023).  Rennie Plowman, FNP  Subjective:    Patient ID: Shelley Garcia, female    DOB: 01/23/1966, 57 y.o.   MRN: [id number]  CC: [name] is a 57 y.o. female who presents today for physical exam.    HPI: Feels well today.  No new complaints.  She is started to lose weight again now that she has resumed Mounjaro    Colorectal Cancer Screening: due Breast Cancer Screening: Mammogram due Cervical Cancer Screening: due  Bone Health  screening/DEXA for 65+: No increased fracture risk. Defer screening at this time.  Lung Cancer Screening: Meets criteria;  Due Shingrex 2 of 2        Tetanus - utd Exercise: No regular exercise, limited by joint pain.   Alcohol use: Rarely Smoking/tobacco use: former smoker, vapes  Health Maintenance  Topic Date Due   Yearly kidney health urinalysis for diabetes  03/02/2018   Mammogram  11/13/2020   COVID-19 Vaccine (5 - 2023-24 season) 11/27/2021   Flu Shot  06/27/2023*   Yearly kidney function blood test for diabetes  03/13/2023   Complete foot exam   03/13/2023   Eye exam for diabetics  04/08/2023   Hemoglobin A1C  05/06/2023   DTaP/Tdap/Td vaccine (2 - Td or Tdap) 02/11/2025   Hepatitis C Screening  Completed   HIV Screening  Completed   Zoster (Shingles) Vaccine  Completed   HPV Vaccine  Aged Out   Pap Smear  Discontinued   Colon Cancer Screening  Discontinued  *Topic was postponed. The date shown is not the original due date.    ALLERGIES: Metformin and related and Codeine  Current Outpatient Medications on File Prior to Visit  Medication Sig Dispense Refill   Blood Glucose Monitoring Suppl (FREESTYLE LITE) DEVI Use as instructed 2 to 3 times daily to check blood sugar.  Dx E11.8 1 each 0   cyanocobalamin (VITAMIN B12) 1000 MCG/ML injection Inject 1 mL (1,000 mcg total) into the muscle every 14 (fourteen)  days. 6 mL 4   ezetimibe (ZETIA) 10 MG tablet Take 1 tablet (10 mg total) by mouth daily. 90 tablet 3   glucose blood (FREESTYLE LITE) test strip Use as instructed to check blood sugar 2-3 times a day.  Dx  E11.8 100 each 5   Lancets (FREESTYLE) lancets Use to check blood sugar 2-3 times daily.  Dx E11.8 100 each 5   lisinopril (ZESTRIL) 10 MG tablet Take 1 tablet (10 mg total) by mouth daily. 90 tablet 3   NEEDLE, DISP, 18 G 18G X 1" MISC 1 each by Does not apply route every 7 (seven) days. 12 each 0   omeprazole (PRILOSEC) 40 MG capsule Take 1 capsule (40 mg total)  by mouth daily. 90 capsule 1   rosuvastatin (CRESTOR) 20 MG tablet Take 1 tablet (20 mg total) by mouth daily. 90 tablet 0   SYRINGE-NEEDLE, DISP, 3 ML (LUER LOCK SAFETY SYRINGES) 23G X 1" 3 ML MISC Use as directed every 7 days 12 each 0   tirzepatide (MOUNJARO) 10 MG/0.5ML Pen Inject 10 mg into the skin once a week. 6 mL 2   Vitamin D, Ergocalciferol, (DRISDOL) 1.25 MG (50000 UNIT) CAPS capsule Take 1 capsule (50,000 Units total) by mouth every 7 (seven) days. 12 capsule 0   No current facility-administered medications on file prior to visit.    Review of Systems  Constitutional:  Negative for chills and fever.  Respiratory:  Negative for cough.   Cardiovascular:  Negative for chest pain and palpitations.  Gastrointestinal:  Negative for nausea and vomiting.      Objective:    BP 130/80   Pulse 80   Temp 97.7 F (36.5 C) (Oral)   Ht 5\' 4"  (1.626 m)   Wt 203 lb 9.6 oz (92.4 kg)   LMP  (LMP Unknown)   SpO2 99%   BMI 34.95 kg/m   BP Readings from Last 3 Encounters:  11/03/22 130/80  10/20/22 132/78  05/04/22 124/80   Wt Readings from Last 3 Encounters:  11/03/22 203 lb 9.6 oz (92.4 kg)  10/20/22 210 lb (95.3 kg)  05/04/22 198 lb 1.6 oz (89.9 kg)    Physical Exam Vitals reviewed.  Constitutional:      Appearance: She is well-developed.  Eyes:     Conjunctiva/sclera: Conjunctivae normal.  Cardiovascular:     Rate and Rhythm: Normal rate and regular rhythm.     Pulses: Normal pulses.     Heart sounds: Normal heart sounds.  Pulmonary:     Effort: Pulmonary effort is normal.     Breath sounds: Normal breath sounds. No wheezing, rhonchi or rales.  Skin:    General: Skin is warm and dry.  Neurological:     Mental Status: She is alert.  Psychiatric:        Speech: Speech normal.        Behavior: Behavior normal.        Thought Content: Thought content normal.

## 2022-11-03 NOTE — Patient Instructions (Signed)
Health Maintenance for Postmenopausal Women Menopause is a normal process in which your ability to get pregnant comes to an end. This process happens slowly over many months or years, usually between the ages of 48 and 55. Menopause is complete when you have missed your menstrual period for 12 months. It is important to talk with your health care provider about some of the most common conditions that affect women after menopause (postmenopausal women). These include heart disease, cancer, and bone loss (osteoporosis). Adopting a healthy lifestyle and getting preventive care can help to promote your health and wellness. The actions you take can also lower your chances of developing some of these common conditions. What are the signs and symptoms of menopause? During menopause, you may have the following symptoms: Hot flashes. These can be moderate or severe. Night sweats. Decrease in sex drive. Mood swings. Headaches. Tiredness (fatigue). Irritability. Memory problems. Problems falling asleep or staying asleep. Talk with your health care provider about treatment options for your symptoms. Do I need hormone replacement therapy? Hormone replacement therapy is effective in treating symptoms that are caused by menopause, such as hot flashes and night sweats. Hormone replacement carries certain risks, especially as you become older. If you are thinking about using estrogen or estrogen with progestin, discuss the benefits and risks with your health care provider. How can I reduce my risk for heart disease and stroke? The risk of heart disease, heart attack, and stroke increases as you age. One of the causes may be a change in the body's hormones during menopause. This can affect how your body uses dietary fats, triglycerides, and cholesterol. Heart attack and stroke are medical emergencies. There are many things that you can do to help prevent heart disease and stroke. Watch your blood pressure High  blood pressure causes heart disease and increases the risk of stroke. This is more likely to develop in people who have high blood pressure readings or are overweight. Have your blood pressure checked: Every 3-5 years if you are 18-39 years of age. Every year if you are 40 years old or older. Eat a healthy diet  Eat a diet that includes plenty of vegetables, fruits, low-fat dairy products, and lean protein. Do not eat a lot of foods that are high in solid fats, added sugars, or sodium. Get regular exercise Get regular exercise. This is one of the most important things you can do for your health. Most adults should: Try to exercise for at least 150 minutes each week. The exercise should increase your heart rate and make you sweat (moderate-intensity exercise). Try to do strengthening exercises at least twice each week. Do these in addition to the moderate-intensity exercise. Spend less time sitting. Even light physical activity can be beneficial. Other tips Work with your health care provider to achieve or maintain a healthy weight. Do not use any products that contain nicotine or tobacco. These products include cigarettes, chewing tobacco, and vaping devices, such as e-cigarettes. If you need help quitting, ask your health care provider. Know your numbers. Ask your health care provider to check your cholesterol and your blood sugar (glucose). Continue to have your blood tested as directed by your health care provider. Do I need screening for cancer? Depending on your health history and family history, you may need to have cancer screenings at different stages of your life. This may include screening for: Breast cancer. Cervical cancer. Lung cancer. Colorectal cancer. What is my risk for osteoporosis? After menopause, you may be   at increased risk for osteoporosis. Osteoporosis is a condition in which bone destruction happens more quickly than new bone creation. To help prevent osteoporosis or  the bone fractures that can happen because of osteoporosis, you may take the following actions: If you are 19-50 years old, get at least 1,000 mg of calcium and at least 600 international units (IU) of vitamin D per day. If you are older than age 50 but younger than age 70, get at least 1,200 mg of calcium and at least 600 international units (IU) of vitamin D per day. If you are older than age 70, get at least 1,200 mg of calcium and at least 800 international units (IU) of vitamin D per day. Smoking and drinking excessive alcohol increase the risk of osteoporosis. Eat foods that are rich in calcium and vitamin D, and do weight-bearing exercises several times each week as directed by your health care provider. How does menopause affect my mental health? Depression may occur at any age, but it is more common as you become older. Common symptoms of depression include: Feeling depressed. Changes in sleep patterns. Changes in appetite or eating patterns. Feeling an overall lack of motivation or enjoyment of activities that you previously enjoyed. Frequent crying spells. Talk with your health care provider if you think that you are experiencing any of these symptoms. General instructions See your health care provider for regular wellness exams and vaccines. This may include: Scheduling regular health, dental, and eye exams. Getting and maintaining your vaccines. These include: Influenza vaccine. Get this vaccine each year before the flu season begins. Pneumonia vaccine. Shingles vaccine. Tetanus, diphtheria, and pertussis (Tdap) booster vaccine. Your health care provider may also recommend other immunizations. Tell your health care provider if you have ever been abused or do not feel safe at home. Summary Menopause is a normal process in which your ability to get pregnant comes to an end. This condition causes hot flashes, night sweats, decreased interest in sex, mood swings, headaches, or lack  of sleep. Treatment for this condition may include hormone replacement therapy. Take actions to keep yourself healthy, including exercising regularly, eating a healthy diet, watching your weight, and checking your blood pressure and blood sugar levels. Get screened for cancer and depression. Make sure that you are up to date with all your vaccines. This information is not intended to replace advice given to you by your health care provider. Make sure you discuss any questions you have with your health care provider. Document Revised: 08/04/2020 Document Reviewed: 08/04/2020 Elsevier Patient Education  2024 [employer]  

## 2022-11-17 ENCOUNTER — Ambulatory Visit: Payer: 59 | Admitting: Sports Medicine

## 2022-11-17 VITALS — BP 130/86 | Ht 64.0 in | Wt 205.0 lb

## 2022-11-17 DIAGNOSIS — M778 Other enthesopathies, not elsewhere classified: Secondary | ICD-10-CM

## 2022-11-17 MED ORDER — METHYLPREDNISOLONE ACETATE 40 MG/ML IJ SUSP
40.0000 mg | Freq: Once | INTRAMUSCULAR | Status: AC
  Administered 2022-11-17: 40 mg via INTRA_ARTICULAR

## 2022-11-17 NOTE — Assessment & Plan Note (Signed)
The patient got significant relief with the last injection to her shoulder and since she still is significantly painful I think it is reasonable to do a second 1 today  Procedure:  Injection of Consent obtained and verified. Time-out conducted. Noted no overlying erythema, induration, or other signs of local infection. Skin prepped in a sterile fashion. Topical analgesic spray: Ethyl chloride. Completed without difficulty.  Injection into the left shoulder joint was accomplished from posterior approach without difficulty or significant pain. Meds: 1 cc Solu-Medrol 40/8 cc lidocaine 1% Advised to call if fevers/chills, erythema, induration, drainage, or persistent bleeding.  Wait 24 hours and then continue her Codman exercises Recheck in 1 month to see if we can continue to improve her range of motion and are controlling her pain adequately Try using topical Arnica gel to see if that helps some of the shoulder symptoms

## 2022-11-17 NOTE — Progress Notes (Signed)
Chief complaint left frozen shoulder  Patient returns 1 month after injection for her frozen shoulder.  The shoulder is still painful and her motion remains somewhat limited.  However she notes the pain is probably 50% improved from before the last visit.  On the last visit she barely had any motion in the shoulder and now she has improved her motion but at least 50%  She still has daily pain and aching and wonders about another injection.  She was unable to take amitriptyline as it made her drowsy even after she cut it in half.  She had similar problems with gabapentin in the past.  She is diabetic and has had another flare of a left frozen shoulder in the late fall 2023.  Physical exam Pleasant white female in no acute distress BP 130/86   Ht 5\' 4"  (1.626 m)   Wt 205 lb (93 kg)   LMP  (LMP Unknown)   BMI 35.19 kg/m   Left shoulder shows 90 degrees of forward flexion 50 degrees of abduction 50 degrees of extension She can barely do a back scratch on the left to the hip level External rotation is limited to about 20 degrees Frozen shoulder test position is still very limited

## 2022-11-24 ENCOUNTER — Encounter (HOSPITAL_COMMUNITY): Payer: Self-pay

## 2022-11-24 ENCOUNTER — Other Ambulatory Visit (HOSPITAL_COMMUNITY): Payer: Self-pay

## 2022-11-25 ENCOUNTER — Other Ambulatory Visit (HOSPITAL_COMMUNITY): Payer: Self-pay

## 2022-11-25 ENCOUNTER — Other Ambulatory Visit: Payer: Self-pay

## 2022-12-09 ENCOUNTER — Other Ambulatory Visit: Payer: 59

## 2022-12-19 ENCOUNTER — Other Ambulatory Visit (HOSPITAL_COMMUNITY): Payer: Self-pay

## 2022-12-21 ENCOUNTER — Other Ambulatory Visit (HOSPITAL_COMMUNITY): Payer: Self-pay

## 2022-12-21 ENCOUNTER — Other Ambulatory Visit: Payer: Self-pay

## 2022-12-21 ENCOUNTER — Encounter: Payer: Self-pay | Admitting: Sports Medicine

## 2022-12-21 ENCOUNTER — Ambulatory Visit: Payer: 59 | Admitting: Sports Medicine

## 2022-12-21 VITALS — BP 128/88 | Ht 64.0 in | Wt 205.0 lb

## 2022-12-21 DIAGNOSIS — M778 Other enthesopathies, not elsewhere classified: Secondary | ICD-10-CM | POA: Diagnosis not present

## 2022-12-21 MED ORDER — KETOROLAC TROMETHAMINE 60 MG/2ML IM SOLN
60.0000 mg | Freq: Once | INTRAMUSCULAR | Status: AC
Start: 2022-12-21 — End: 2022-12-21
  Administered 2022-12-21: 60 mg via INTRA_ARTICULAR

## 2022-12-21 MED ORDER — PREGABALIN 25 MG PO CAPS
25.0000 mg | ORAL_CAPSULE | Freq: Every day | ORAL | 0 refills | Status: DC
Start: 2022-12-21 — End: 2023-09-08
  Filled 2022-12-21: qty 30, 30d supply, fill #0

## 2022-12-21 NOTE — Assessment & Plan Note (Signed)
-   The patient has gotten some improvement with home Codman and range of motion exercises. - She will need to continue to improve with range of motion before we can work on strengthening. - We will start a low-dose of Lyrica for likely neuropathic involvement.  She does have a history of diabetes but her A1c is well-controlled. - Intra-articular Toradol injection as below - We will follow-up with her in 1 month.  If there is no improvement at that point we can refer her to PT.  We will be explicit to have them work her and passive range of motion only in order to avoid reaggravation.  Intraarticular L shoulder injection.  After discussion on risks/benefits/indications, informed verbal consent was obtained. A timeout was then performed. The patient was seated in examination room. The posterior shoulder was prepped with alcohol and ethyl chloride for anesthesia. The glenohumeral joint was injected using a 25G, 1.5" needle with 57mL:2mL lidocaine:toradol 60mg /88mL of injectate via a posterior approach. Patient tolerated the procedure well without immediate complications.

## 2022-12-21 NOTE — Addendum Note (Signed)
Addended by: Rica Mote on: 12/21/2022 05:37 PM   Modules accepted: Orders

## 2022-12-21 NOTE — Progress Notes (Signed)
Shelley Garcia - 57 y.o. female MRN [id number]  Date of birth: 10-Apr-1965  PCP: Allegra Grana, FNP  Subjective:  No chief complaint on file. Left frozen shoulder  HPI: Past Medical, Surgical, Social, and Family History Reviewed & Updated per EMR.   Patient is a 57 y.o. female here for follow up on adhesive capsulitis of the left shoulder, last seen on 12/14/2022. The patient had a large-volume injection which gave some relief.  She has been doing the forward range of motion exercises but none in abduction, which has given her some increase in active range of motion.   Past Medical History:  Diagnosis Date   B12 deficiency 02/12/2015   Back pain    Cervical spine pain 09/01/2015   Depression with anxiety 07/29/2013   Diabetes mellitus    Diabetes mellitus type 2 with complications, uncontrolled 10/06/2010   Diagnosed with Type II DM in ~ 2008; pt does not tolerate Metformin (GI upset). Has been on Lantus since 2011. Vision assessment every 2 years- Optometry.    Dyspnea on exertion 10/12/2016   Elevated TSH 03/02/2017   Esophageal reflux 10/06/2010   Essential hypertension 11/23/2016   Gallbladder problem    GERD (gastroesophageal reflux disease)    History of chicken pox    History of migraine headaches 05/17/2012   Hyperlipidemia    Hypertension    Joint pain    Nonallopathic lesion of cervical region 11/18/2015   Nonallopathic lesion of lumbosacral region 11/18/2015   Nonallopathic lesion of thoracic region 11/18/2015   Obesity    Other fatigue 10/12/2016   Other hyperlipidemia 03/02/2017   Trapezius strain 02/12/2015   Type 2 diabetes mellitus without complication, without long-term current use of insulin (HCC) 10/12/2016   Vitamin B12 deficiency    Vitamin D deficiency     Current Outpatient Medications on File Prior to Visit  Medication Sig Dispense Refill   Blood Glucose Monitoring Suppl (FREESTYLE LITE) DEVI Use as instructed 2 to 3 times daily to check blood sugar.  Dx  E11.8 1 each 0   cyanocobalamin (VITAMIN B12) 1000 MCG/ML injection Inject 1 mL (1,000 mcg total) into the muscle every 14 (fourteen) days. 6 mL 4   ezetimibe (ZETIA) 10 MG tablet Take 1 tablet (10 mg total) by mouth daily. 90 tablet 3   glucose blood (FREESTYLE LITE) test strip Use as instructed to check blood sugar 2-3 times a day.  Dx  E11.8 100 each 5   Lancets (FREESTYLE) lancets Use to check blood sugar 2-3 times daily.  Dx E11.8 100 each 5   lisinopril (ZESTRIL) 10 MG tablet Take 1 tablet (10 mg total) by mouth daily. 90 tablet 3   NEEDLE, DISP, 18 G 18G X 1" MISC 1 each by Does not apply route every 7 (seven) days. 12 each 0   omeprazole (PRILOSEC) 40 MG capsule Take 1 capsule (40 mg total) by mouth daily. 90 capsule 1   rosuvastatin (CRESTOR) 20 MG tablet Take 1 tablet (20 mg total) by mouth daily. 90 tablet 0   SYRINGE-NEEDLE, DISP, 3 ML (LUER LOCK SAFETY SYRINGES) 23G X 1" 3 ML MISC Use as directed every 7 days 12 each 0   tirzepatide (MOUNJARO) 10 MG/0.5ML Pen Inject 10 mg into the skin once a week. 6 mL 2   Vitamin D, Ergocalciferol, (DRISDOL) 1.25 MG (50000 UNIT) CAPS capsule Take 1 capsule (50,000 Units total) by mouth every 7 (seven) days. 12 capsule 0   No current facility-administered medications  on file prior to visit.    Past Surgical History:  Procedure Laterality Date   CHOLECYSTECTOMY  1989   EXPLORATORY LAPAROTOMY     KIDNEY STONE SURGERY     WISDOM TOOTH EXTRACTION  07/31/2013    Allergies  Allergen Reactions   Metformin And Related Nausea And Vomiting    Made pt really sick   Codeine Rash        Objective:  Physical Exam: VS: BP:128/88  HR: bpm  TEMP: ( )  RESP:   HT:5\' 4"  (162.6 cm)   WT:205 lb (93 kg)  BMI:35.17  Gen: NAD, speaks clearly, comfortable in exam room Respiratory: Normal respiratory effort on room air. No signs of distress Skin: No rashes, abrasions, or ecchymosis MSK: Inspection of the left shoulder shows no deformity ROM to  approximately 75 degrees flexion and 60 degrees abduction.  Internal rotation restricted to midline Passive range of motion to 90 degrees flexion and 80 degrees abduction.    Assessment & Plan:   Capsulitis of left shoulder - The patient has gotten some improvement with home Codman and range of motion exercises. - She will need to continue to improve with range of motion before we can work on strengthening. - We will start a low-dose of Lyrica for likely neuropathic involvement.  She does have a history of diabetes but her A1c is well-controlled. - Intra-articular Toradol injection as below - We will follow-up with her in 1 month.  If there is no improvement at that point we can refer her to PT.  We will be explicit to have them work her and passive range of motion only in order to avoid reaggravation.  Intraarticular L shoulder injection.  After discussion on risks/benefits/indications, informed verbal consent was obtained. A timeout was then performed. The patient was seated in examination room. The posterior shoulder was prepped with alcohol and ethyl chloride for anesthesia. The glenohumeral joint was injected using a 25G, 1.5" needle with 83mL:2mL lidocaine:toradol 60mg /90mL of injectate via a posterior approach. Patient tolerated the procedure well without immediate complications.      Rica Mote MD [facility] Sports Medicine Fellow  I observed and examined the patient with the [facility] resident and agree with assessment and plan.  Note reviewed and modified by me. Sterling Big, MD

## 2022-12-22 ENCOUNTER — Other Ambulatory Visit: Payer: Self-pay

## 2023-01-18 ENCOUNTER — Ambulatory Visit: Payer: 59 | Admitting: Sports Medicine

## 2023-01-19 ENCOUNTER — Other Ambulatory Visit: Payer: Self-pay

## 2023-02-14 ENCOUNTER — Encounter: Payer: Self-pay | Admitting: Family

## 2023-02-15 ENCOUNTER — Other Ambulatory Visit: Payer: Self-pay

## 2023-02-15 ENCOUNTER — Other Ambulatory Visit: Payer: Self-pay | Admitting: Family

## 2023-02-15 DIAGNOSIS — E119 Type 2 diabetes mellitus without complications: Secondary | ICD-10-CM

## 2023-02-15 MED ORDER — TIRZEPATIDE 12.5 MG/0.5ML ~~LOC~~ SOAJ
12.5000 mg | SUBCUTANEOUS | 3 refills | Status: DC
Start: 2023-02-15 — End: 2024-01-21
  Filled 2023-02-15: qty 2, 28d supply, fill #0
  Filled 2023-03-14: qty 2, 28d supply, fill #1
  Filled 2023-04-08 – 2023-04-18 (×2): qty 2, 28d supply, fill #2
  Filled 2023-05-11: qty 2, 28d supply, fill #3
  Filled 2023-06-07: qty 2, 28d supply, fill #4
  Filled 2023-07-04: qty 2, 28d supply, fill #5
  Filled 2023-08-01: qty 2, 28d supply, fill #6
  Filled 2023-09-01: qty 2, 28d supply, fill #7
  Filled 2023-10-03: qty 2, 28d supply, fill #8
  Filled 2023-10-30: qty 2, 28d supply, fill #9
  Filled 2023-11-29: qty 2, 28d supply, fill #10
  Filled 2023-12-25: qty 2, 28d supply, fill #11

## 2023-03-10 ENCOUNTER — Ambulatory Visit (INDEPENDENT_AMBULATORY_CARE_PROVIDER_SITE_OTHER): Payer: 59 | Admitting: Family

## 2023-03-10 ENCOUNTER — Encounter: Payer: Self-pay | Admitting: Family

## 2023-03-10 VITALS — BP 132/80 | HR 90 | Temp 97.7°F | Ht 64.0 in | Wt 198.4 lb

## 2023-03-10 DIAGNOSIS — E119 Type 2 diabetes mellitus without complications: Secondary | ICD-10-CM | POA: Diagnosis not present

## 2023-03-10 DIAGNOSIS — Z7985 Long-term (current) use of injectable non-insulin antidiabetic drugs: Secondary | ICD-10-CM

## 2023-03-10 DIAGNOSIS — I10 Essential (primary) hypertension: Secondary | ICD-10-CM | POA: Diagnosis not present

## 2023-03-10 NOTE — Assessment & Plan Note (Signed)
Chronic, stable. Continue lisinopril 10mg daily. 

## 2023-03-10 NOTE — Assessment & Plan Note (Signed)
Excellent control.  Pending updating A1c.  Continue Mounjaro 12.5 mg for glycemic control, weight loss

## 2023-03-10 NOTE — Progress Notes (Signed)
Assessment & Plan:  Type 2 diabetes mellitus without complication, without long-term current use of insulin (HCC) Assessment & Plan: Excellent control.  Pending updating A1c.  Continue Mounjaro 12.5 mg for glycemic control, weight loss  Orders: -     Hemoglobin A1c; Future -     Microalbumin / creatinine urine ratio  Primary hypertension Assessment & Plan: Chronic, stable.  Continue lisinopril 10 mg daily      Return precautions given.   Risks, benefits, and alternatives of the medications and treatment plan prescribed today were discussed, and patient expressed understanding.   Education regarding symptom management and diagnosis given to patient on AVS either electronically or printed.  Return in about 6 months (around 09/08/2023).  Rennie Plowman, FNP  Subjective:    Patient ID: Shelley Garcia, female    DOB: [birth year], 57 y.o.   MRN: [id number]  CC: Shelley Garcia is a 57 y.o. female who presents today for follow up.   HPI: Feels well today.  No new complaints.  Doing well on Mounjaro 12.5 mg.  Denies constipation, abdominal pain.  She is pleased with weight loss.      Allergies: Metformin and related and Codeine Current Outpatient Medications on File Prior to Visit  Medication Sig Dispense Refill   Blood Glucose Monitoring Suppl (FREESTYLE LITE) DEVI Use as instructed 2 to 3 times daily to check blood sugar.  Dx E11.8 1 each 0   cyanocobalamin (VITAMIN B12) 1000 MCG/ML injection Inject 1 mL (1,000 mcg total) into the muscle every 14 (fourteen) days. 6 mL 4   ezetimibe (ZETIA) 10 MG tablet Take 1 tablet (10 mg total) by mouth daily. 90 tablet 3   glucose blood (FREESTYLE LITE) test strip Use as instructed to check blood sugar 2-3 times a day.  Dx  E11.8 100 each 5   Lancets (FREESTYLE) lancets Use to check blood sugar 2-3 times daily.  Dx E11.8 100 each 5   lisinopril (ZESTRIL) 10 MG tablet Take 1 tablet (10 mg total) by mouth daily. 90 tablet 3   NEEDLE,  DISP, 18 G 18G X 1" MISC 1 each by Does not apply route every 7 (seven) days. 12 each 0   omeprazole (PRILOSEC) 40 MG capsule Take 1 capsule (40 mg total) by mouth daily. 90 capsule 1   pregabalin (LYRICA) 25 MG capsule Take 1 capsule (25 mg total) by mouth at bedtime. 30 capsule 0   rosuvastatin (CRESTOR) 20 MG tablet Take 1 tablet (20 mg total) by mouth daily. 90 tablet 0   SYRINGE-NEEDLE, DISP, 3 ML (LUER LOCK SAFETY SYRINGES) 23G X 1" 3 ML MISC Use as directed every 7 days 12 each 0   tirzepatide (MOUNJARO) 12.5 MG/0.5ML Pen Inject 12.5 mg into the skin once a week. 6 mL 3   Vitamin D, Ergocalciferol, (DRISDOL) 1.25 MG (50000 UNIT) CAPS capsule Take 1 capsule (50,000 Units total) by mouth every 7 (seven) days. 12 capsule 0   No current facility-administered medications on file prior to visit.    Review of Systems  Constitutional:  Negative for chills and fever.  Respiratory:  Negative for cough.   Cardiovascular:  Negative for chest pain and palpitations.  Gastrointestinal:  Negative for nausea and vomiting.      Objective:    BP 132/80   Pulse 90   Temp 97.7 F (36.5 C) (Oral)   Ht 5\' 4"  (1.626 m)   Wt 198 lb 6.4 oz (90 kg)   SpO2 98%  BMI 34.06 kg/m  BP Readings from Last 3 Encounters:  03/10/23 132/80  12/21/22 128/88  11/17/22 130/86   Wt Readings from Last 3 Encounters:  03/10/23 198 lb 6.4 oz (90 kg)  12/21/22 205 lb (93 kg)  11/17/22 205 lb (93 kg)    Physical Exam Vitals reviewed.  Constitutional:      Appearance: She is well-developed.  Eyes:     Conjunctiva/sclera: Conjunctivae normal.  Cardiovascular:     Rate and Rhythm: Normal rate and regular rhythm.     Pulses: Normal pulses.     Heart sounds: Normal heart sounds.  Pulmonary:     Effort: Pulmonary effort is normal.     Breath sounds: Normal breath sounds. No wheezing, rhonchi or rales.  Skin:    General: Skin is warm and dry.  Neurological:     Mental Status: She is alert.  Psychiatric:         Speech: Speech normal.        Behavior: Behavior normal.        Thought Content: Thought content normal.

## 2023-03-11 LAB — MICROALBUMIN / CREATININE URINE RATIO
Creatinine,U: 238.4 mg/dL
Microalb Creat Ratio: 0.5 mg/g (ref 0.0–30.0)
Microalb, Ur: 1.1 mg/dL (ref 0.0–1.9)

## 2023-03-15 ENCOUNTER — Other Ambulatory Visit: Payer: Self-pay

## 2023-03-15 ENCOUNTER — Telehealth: Payer: Self-pay

## 2023-03-15 NOTE — Telephone Encounter (Signed)
Pt has fasting labs scheduled already

## 2023-04-07 ENCOUNTER — Other Ambulatory Visit (INDEPENDENT_AMBULATORY_CARE_PROVIDER_SITE_OTHER): Payer: Commercial Managed Care - PPO

## 2023-04-07 DIAGNOSIS — I10 Essential (primary) hypertension: Secondary | ICD-10-CM

## 2023-04-07 DIAGNOSIS — E119 Type 2 diabetes mellitus without complications: Secondary | ICD-10-CM

## 2023-04-07 DIAGNOSIS — Z8639 Personal history of other endocrine, nutritional and metabolic disease: Secondary | ICD-10-CM

## 2023-04-07 LAB — CBC WITH DIFFERENTIAL/PLATELET
Basophils Absolute: 0.1 10*3/uL (ref 0.0–0.1)
Basophils Relative: 1.2 % (ref 0.0–3.0)
Eosinophils Absolute: 0.2 10*3/uL (ref 0.0–0.7)
Eosinophils Relative: 2.5 % (ref 0.0–5.0)
HCT: 45.9 % (ref 36.0–46.0)
Hemoglobin: 15.1 g/dL — ABNORMAL HIGH (ref 12.0–15.0)
Lymphocytes Relative: 26 % (ref 12.0–46.0)
Lymphs Abs: 1.6 10*3/uL (ref 0.7–4.0)
MCHC: 32.9 g/dL (ref 30.0–36.0)
MCV: 94.6 fL (ref 78.0–100.0)
Monocytes Absolute: 0.6 10*3/uL (ref 0.1–1.0)
Monocytes Relative: 9.6 % (ref 3.0–12.0)
Neutro Abs: 3.7 10*3/uL (ref 1.4–7.7)
Neutrophils Relative %: 60.7 % (ref 43.0–77.0)
Platelets: 282 10*3/uL (ref 150.0–400.0)
RBC: 4.85 Mil/uL (ref 3.87–5.11)
RDW: 14.5 % (ref 11.5–15.5)
WBC: 6.1 10*3/uL (ref 4.0–10.5)

## 2023-04-07 LAB — COMPREHENSIVE METABOLIC PANEL
ALT: 15 U/L (ref 0–35)
AST: 19 U/L (ref 0–37)
Albumin: 4.1 g/dL (ref 3.5–5.2)
Alkaline Phosphatase: 45 U/L (ref 39–117)
BUN: 15 mg/dL (ref 6–23)
CO2: 27 meq/L (ref 19–32)
Calcium: 8.9 mg/dL (ref 8.4–10.5)
Chloride: 105 meq/L (ref 96–112)
Creatinine, Ser: 0.71 mg/dL (ref 0.40–1.20)
GFR: 95.59 mL/min (ref >60.00)
Glucose, Bld: 98 mg/dL (ref 70–99)
Potassium: 4.1 meq/L (ref 3.5–5.1)
Sodium: 138 meq/L (ref 135–145)
Total Bilirubin: 0.5 mg/dL (ref 0.2–1.2)
Total Protein: 6.9 g/dL (ref 6.0–8.3)

## 2023-04-07 LAB — VITAMIN D 25 HYDROXY (VIT D DEFICIENCY, FRACTURES): VITD: 14.34 ng/mL — ABNORMAL LOW (ref 30.00–100.00)

## 2023-04-07 LAB — LIPID PANEL
Cholesterol: 223 mg/dL — ABNORMAL HIGH (ref 0–200)
HDL: 44 mg/dL (ref >39.00)
LDL Cholesterol: 144 mg/dL — ABNORMAL HIGH (ref 0–99)
NonHDL: 179.34
Total CHOL/HDL Ratio: 5
Triglycerides: 177 mg/dL — ABNORMAL HIGH (ref 0.0–149.0)
VLDL: 35.4 mg/dL (ref 0.0–40.0)

## 2023-04-07 LAB — TSH: TSH: 3.38 u[IU]/mL (ref 0.35–5.50)

## 2023-04-07 LAB — HEMOGLOBIN A1C: Hgb A1c MFr Bld: 5.9 % (ref 4.6–6.5)

## 2023-04-09 ENCOUNTER — Other Ambulatory Visit (HOSPITAL_COMMUNITY): Payer: Self-pay

## 2023-04-12 ENCOUNTER — Encounter: Payer: Self-pay | Admitting: Family

## 2023-04-12 ENCOUNTER — Other Ambulatory Visit: Payer: Self-pay | Admitting: Family

## 2023-04-12 DIAGNOSIS — Z8639 Personal history of other endocrine, nutritional and metabolic disease: Secondary | ICD-10-CM

## 2023-04-12 MED ORDER — CHOLECALCIFEROL 1.25 MG (50000 UT) PO TABS: ORAL_TABLET | ORAL | 0 refills | Status: DC

## 2023-04-18 ENCOUNTER — Other Ambulatory Visit (HOSPITAL_COMMUNITY): Payer: Self-pay

## 2023-04-28 ENCOUNTER — Other Ambulatory Visit: Payer: Self-pay

## 2023-05-11 ENCOUNTER — Other Ambulatory Visit (HOSPITAL_COMMUNITY): Payer: Self-pay

## 2023-06-07 ENCOUNTER — Other Ambulatory Visit (HOSPITAL_COMMUNITY): Payer: Self-pay

## 2023-07-04 ENCOUNTER — Other Ambulatory Visit (HOSPITAL_COMMUNITY): Payer: Self-pay

## 2023-08-01 ENCOUNTER — Other Ambulatory Visit (HOSPITAL_COMMUNITY): Payer: Self-pay

## 2023-09-01 ENCOUNTER — Other Ambulatory Visit: Payer: Self-pay

## 2023-09-08 ENCOUNTER — Other Ambulatory Visit (HOSPITAL_COMMUNITY): Payer: Self-pay

## 2023-09-08 ENCOUNTER — Ambulatory Visit: Payer: 59 | Admitting: Family

## 2023-09-08 VITALS — BP 130/76 | HR 72 | Temp 97.9°F | Ht 64.0 in | Wt 178.8 lb

## 2023-09-08 DIAGNOSIS — E119 Type 2 diabetes mellitus without complications: Secondary | ICD-10-CM | POA: Diagnosis not present

## 2023-09-08 DIAGNOSIS — I10 Essential (primary) hypertension: Secondary | ICD-10-CM | POA: Diagnosis not present

## 2023-09-08 DIAGNOSIS — F4320 Adjustment disorder, unspecified: Secondary | ICD-10-CM

## 2023-09-08 DIAGNOSIS — Z7985 Long-term (current) use of injectable non-insulin antidiabetic drugs: Secondary | ICD-10-CM

## 2023-09-08 MED ORDER — FLUOXETINE HCL 20 MG PO CAPS
20.0000 mg | ORAL_CAPSULE | Freq: Every morning | ORAL | 3 refills | Status: AC
  Filled 2023-09-08: qty 30, 30d supply, fill #0
  Filled 2023-10-03: qty 30, 30d supply, fill #1
  Filled 2023-11-29: qty 30, 30d supply, fill #2
  Filled 2023-12-25: qty 30, 30d supply, fill #3

## 2023-09-08 NOTE — Assessment & Plan Note (Signed)
 Chronic, uncontrolled.  Grief for beloved family members.   She politely declines counseling at this time.  She is willing to try low-dose Prozac.  Discussed side effects and titration.  She will let me know how she is doing

## 2023-09-08 NOTE — Assessment & Plan Note (Signed)
 Lab Results  Component Value Date   HGBA1C 5.9 04/07/2023   Recently under excellent control.  Will plan to obtain A1c at follow-up.  Tolerating Mounjaro  well.  Continue Mounjaro  12.5 mg

## 2023-09-08 NOTE — Progress Notes (Signed)
 Assessment & Plan:  Grief reaction Assessment & Plan: Chronic, uncontrolled.  Grief for beloved family members.   She politely declines counseling at this time.  She is willing to try low-dose Prozac.  Discussed side effects and titration.  She will let me know how she is doing   Type 2 diabetes mellitus without complication, without long-term current use of insulin  [facility]) Assessment & Plan: Lab Results  Component Value Date   HGBA1C 5.9 04/07/2023   Recently under excellent control.  Will plan to obtain A1c at follow-up.  Tolerating Mounjaro  well.  Continue Mounjaro  12.5 mg   Primary hypertension Assessment & Plan: She is no longer on lisinopril .  Blood pressure acceptable today.  No history of increased urine microalbumin.  will continue to monitor   Other orders -     FLUoxetine HCl; Take 1 capsule (20 mg total) by mouth every morning.  Dispense: 30 capsule; Refill: 3     Return precautions given.   Risks, benefits, and alternatives of the medications and treatment plan prescribed today were discussed, and patient expressed understanding.   Education regarding symptom management and diagnosis given to patient on AVS either electronically or printed.  No follow-ups on file.  Bascom Bossier, FNP  Subjective:    Patient ID: Shelley Garcia, female    DOB: Jul 10, 1965, 58 y.o.   MRN: [id number]  CC: Shelley Garcia is a 58 y.o. female who presents today for follow up.   HPI: Complains of grief after loss of granddaughter and most recently father.  She describes increased apathy, difficulty concentrating. She feels like a 'shell'.  Her son has expressed concern. Trouble falling asleep.  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  Compliant with Mounjaro  12.5 mg weekly.  She is pleased with weight loss.  Denies constipation,  nausea.  Her goal weight 145 pounds  Allergies: Metformin, Metformin and related, and Codeine Current Outpatient Medications on File Prior to Visit  Medication Sig Dispense Refill   tirzepatide  (MOUNJARO ) 12.5 MG/0.5ML Pen Inject 12.5 mg into the skin once a week. 6 mL 3   Blood Glucose Monitoring Suppl (FREESTYLE LITE) DEVI Use as instructed 2 to 3 times daily to check blood sugar.  Dx E11.8 (Patient not taking: Reported on 09/08/2023) 1 each 0   Cholecalciferol  1.25 MG (50000 UT) TABS 50,000 units PO qwk for 12 weeks. (Patient not taking: Reported on 09/08/2023) 12 tablet 0   cyanocobalamin  (VITAMIN B12) 1000 MCG/ML injection Inject 1 mL (1,000 mcg total) into the muscle every 14 (fourteen) days. (Patient not taking: Reported on 09/08/2023) 6 mL 4   ezetimibe  (ZETIA ) 10 MG tablet Take 1 tablet (10 mg total) by mouth daily. (Patient not taking: Reported on 09/08/2023) 90 tablet 3   glucose blood (FREESTYLE LITE) test strip Use as instructed to check blood sugar 2-3 times a day.  Dx  E11.8 (Patient not taking: Reported on 09/08/2023) 100 each 5   Lancets (FREESTYLE) lancets Use to check blood sugar 2-3 times daily.  Dx E11.8 (Patient not taking: Reported on 09/08/2023) 100 each 5   NEEDLE, DISP, 18 G 18G X 1 MISC 1 each by Does not apply route every 7 (seven) days. (Patient not taking: Reported on 09/08/2023) 12 each 0   rosuvastatin  (CRESTOR ) 20 MG tablet Take 1 tablet (20 mg total) by mouth daily. (Patient  not taking: Reported on 09/08/2023) 90 tablet 0   SYRINGE-NEEDLE, DISP, 3 ML (LUER LOCK SAFETY SYRINGES) 23G X 1 3 ML MISC Use as directed every 7 days (Patient not taking: Reported on 09/08/2023) 12 each 0   No current facility-administered medications on file prior to visit.    Review of Systems  Constitutional:  Negative for chills and fever.  Respiratory:  Negative for cough.   Cardiovascular:  Negative for chest pain and palpitations.  Gastrointestinal:  Negative for abdominal pain,  constipation, nausea and vomiting.  Psychiatric/Behavioral:  Positive for sleep disturbance. Negative for suicidal ideas. The patient is nervous/anxious.       Objective:    BP 130/76   Pulse 72   Temp 97.9 F (36.6 C) (Oral)   Ht 5' 4 (1.626 m)   Wt 178 lb 12.8 oz (81.1 kg)   LMP  (LMP Unknown)   SpO2 98%   BMI 30.69 kg/m  BP Readings from Last 3 Encounters:  09/08/23 130/76  03/10/23 132/80  12/21/22 128/88   Wt Readings from Last 3 Encounters:  09/08/23 178 lb 12.8 oz (81.1 kg)  03/10/23 198 lb 6.4 oz (90 kg)  12/21/22 205 lb (93 kg)    Physical Exam Vitals reviewed.  Constitutional:      Appearance: She is well-developed.   Eyes:     Conjunctiva/sclera: Conjunctivae normal.    Cardiovascular:     Rate and Rhythm: Normal rate and regular rhythm.     Pulses: Normal pulses.     Heart sounds: Normal heart sounds.  Pulmonary:     Effort: Pulmonary effort is normal.     Breath sounds: Normal breath sounds. No wheezing, rhonchi or rales.   Skin:    General: Skin is warm and dry.   Neurological:     Mental Status: She is alert.   Psychiatric:        Speech: Speech normal.        Behavior: Behavior normal.        Thought Content: Thought content normal.

## 2023-09-08 NOTE — Assessment & Plan Note (Addendum)
 She is no longer on lisinopril .  Blood pressure acceptable today.  No history of increased urine microalbumin.  will continue to monitor

## 2023-09-08 NOTE — Patient Instructions (Signed)
 Trial of prozac  Fluoxetine Capsules or Tablets (Depression/Mood Disorders) What is this medication? FLUOXETINE (floo OX e teen) treats depression, anxiety, obsessive-compulsive disorder (OCD), and eating disorders. It increases the amount of serotonin in the brain, a hormone that helps regulate mood. It belongs to a group of medications called SSRIs. This medicine may be used for other purposes; ask your health care provider or pharmacist if you have questions. COMMON BRAND NAME(S): Prozac What should I tell my care team before I take this medication? They need to know if you have any of these conditions: Bipolar disorder or a family history of bipolar disorder Bleeding disorder Glaucoma Heart disease Liver disease Low levels of sodium in the blood Seizures Suicidal thoughts, plans, or attempt by you or a family member Take an MAOI, such as Carbex, Eldepryl, Marplan, Nardil, or Parnate Take medications that treat or prevent blood clots Thyroid  disease An unusual or allergic reaction to fluoxetine, other medications, foods, dyes, or preservatives Pregnant or trying to get pregnant Breastfeeding How should I use this medication? Take this medication by mouth with a glass of water. Follow the directions on the prescription label. You can take this medication with or without food. Take your medication at regular intervals. Do not take it more often than directed. Do not stop taking this medication suddenly except upon the advice of your care team. Stopping this medication too quickly may cause serious side effects or your condition may worsen. A special MedGuide will be given to you by the pharmacist with each prescription and refill. Be sure to read this information carefully each time. Talk to your care team about the use of this medication in children. While it may be prescribed for children as young as 7 years for selected conditions, precautions do apply. Overdosage: If you think you  have taken too much of this medicine contact a poison control center or emergency room at once. NOTE: This medicine is only for you. Do not share this medicine with others. What if I miss a dose? If you miss a dose, skip the missed dose and go back to your regular dosing schedule. Do not take double or extra doses. What may interact with this medication? Do not take this medication with any of the following: Other medications containing fluoxetine, such as Sarafem or Symbyax Cisapride Dronedarone Linezolid MAOIs, such as Carbex, Eldepryl, Marplan, Nardil, and Parnate Methylene blue (injected into a vein) Pimozide Thioridazine This medication may also interact with the following: Alcohol Amphetamines Aspirin and aspirin-like medications Carbamazepine Certain medications for mental health conditions Certain medications for migraine headache, such as almotriptan, eletriptan, frovatriptan, naratriptan, rizatriptan, sumatriptan, zolmitriptan Digoxin Diuretics Fentanyl Flecainide Furazolidone Isoniazid Lithium Medications that help you fall asleep Medications that treat or prevent blood clots, such as warfarin, enoxaparin, and dalteparin NSAIDs, medications for pain and inflammation, such as ibuprofen or naproxen  Other medications that cause heart rhythm changes Phenytoin Procarbazine Propafenone Rasagiline Ritonavir Supplements, such as St. John's wort, kava kava, valerian Tramadol Tryptophan Vinblastine This list may not describe all possible interactions. Give your health care provider a list of all the medicines, herbs, non-prescription drugs, or dietary supplements you use. Also tell them if you smoke, drink alcohol, or use illegal drugs. Some items may interact with your medicine. What should I watch for while using this medication? Tell your care team if your symptoms do not get better or if they get worse. Visit your care team for regular checks on your progress. Because  it may take  several weeks to see the full effects of this medication, it is important to continue your treatment as prescribed. Watch for new or worsening thoughts of suicide or depression. This includes sudden changes in mood, behavior, or thoughts. These changes can happen at any time but are more common in the beginning of treatment or after a change in dose. Call your care team right away if you experience these thoughts or worsening depression. This medication may cause mood and behavior changes, such as anxiety, nervousness, irritability, hostility, restlessness, excitability, hyperactivity, or trouble sleeping. These changes can happen at any time but are more common in the beginning of treatment or after a change in dose. Call your care team right away if you notice any of these symptoms. This medication may affect your coordination, reaction time, or judgment. Do not drive or operate machinery until you know how this medication affects you. Sit up or stand slowly to reduce the risk of dizzy or fainting spells. Drinking alcohol with this medication can increase the risk of these side effects. Your mouth may get dry. Chewing sugarless gum or sucking hard candy and drinking plenty of water may help. Contact your care team if the problem does not go away or is severe. This medication may affect blood sugar levels. If you have diabetes, check with your care team before you make changes to your diet or medications. What side effects may I notice from receiving this medication? Side effects that you should report to your care team as soon as possible: Allergic reactions--skin rash, itching, hives, swelling of the face, lips, tongue, or throat Bleeding--bloody or black, tar-like stools, red or dark brown urine, vomiting blood or brown material that looks like coffee grounds, small, red or purple spots on skin, unusual bleeding or bruising Heart rhythm changes--fast or irregular heartbeat, dizziness,  feeling faint or lightheaded, chest pain, trouble breathing Loss of appetite with weight loss Low sodium level--muscle weakness, fatigue, dizziness, headache, confusion Serotonin syndrome--irritability, confusion, fast or irregular heartbeat, muscle stiffness, twitching muscles, sweating, high fever, seizure, chills, vomiting, diarrhea Sudden eye pain or change in vision such as blurry vision, seeing halos around lights, vision loss Thoughts of suicide or self-harm, worsening mood, feelings of depression Side effects that usually do not require medical attention (report to your care team if they continue or are bothersome): Anxiety, nervousness Change in sex drive or performance Diarrhea Dry mouth Headache Excessive sweating Nausea Tremors or shaking Trouble sleeping Upset stomach This list may not describe all possible side effects. Call your doctor for medical advice about side effects. You may report side effects to FDA at 1-800-FDA-1088. Where should I keep my medication? Keep out of the reach of children and pets. Store at room temperature between 15 and 30 degrees C (59 and 86 degrees F). Get rid of any unused medication after the expiration date. NOTE: This sheet is a summary. It may not cover all possible information. If you have questions about this medicine, talk to your doctor, pharmacist, or health care provider.  2024 [employer]/Gold Standard (2021-12-29 00:00:00)

## 2023-10-03 ENCOUNTER — Other Ambulatory Visit (HOSPITAL_COMMUNITY): Payer: Self-pay

## 2023-10-04 ENCOUNTER — Other Ambulatory Visit: Payer: Self-pay

## 2023-10-31 ENCOUNTER — Other Ambulatory Visit (HOSPITAL_COMMUNITY): Payer: Self-pay

## 2023-11-09 ENCOUNTER — Encounter: Payer: Self-pay | Admitting: Family

## 2023-11-09 ENCOUNTER — Ambulatory Visit: Admitting: Family

## 2023-11-09 ENCOUNTER — Other Ambulatory Visit (HOSPITAL_COMMUNITY): Payer: Self-pay

## 2023-11-09 VITALS — BP 130/74 | HR 80 | Temp 98.2°F | Ht 64.0 in | Wt 174.0 lb

## 2023-11-09 DIAGNOSIS — F4320 Adjustment disorder, unspecified: Secondary | ICD-10-CM

## 2023-11-09 DIAGNOSIS — Z0000 Encounter for general adult medical examination without abnormal findings: Secondary | ICD-10-CM | POA: Diagnosis not present

## 2023-11-09 DIAGNOSIS — Z716 Tobacco abuse counseling: Secondary | ICD-10-CM | POA: Diagnosis not present

## 2023-11-09 DIAGNOSIS — E119 Type 2 diabetes mellitus without complications: Secondary | ICD-10-CM

## 2023-11-09 DIAGNOSIS — E559 Vitamin D deficiency, unspecified: Secondary | ICD-10-CM | POA: Diagnosis not present

## 2023-11-09 DIAGNOSIS — Z23 Encounter for immunization: Secondary | ICD-10-CM | POA: Diagnosis not present

## 2023-11-09 MED ORDER — VARENICLINE TARTRATE 0.5 MG PO TABS: ORAL_TABLET | ORAL | 0 refills | Status: AC

## 2023-11-09 MED ORDER — CHOLECALCIFEROL 1.25 MG (50000 UT) PO TABS: ORAL_TABLET | ORAL | 0 refills | Status: AC

## 2023-11-09 NOTE — Patient Instructions (Signed)
 The below is how you take Chantix .  Please tell family , close that you are starting Chantix  so they can not only support you however also make sure you do not show any unsual thoughts, behavior- this is rare however I want you to be vigilant.    Make a plan to slowly decrease smoking as you are on the chantix  until your quit date. ( see below for detailed instructions).    Be mindful of  common side effects- mainly GI upset, so please TAKE with FOOD. You may also have strange dreams, however this too is less common.   Select a quit date within 7 days of starting Chantix . Goal is you should stop smoking within 8 to 35 days of starting chantix   Initial: Days 1 to 3: 0.5 mg by mouth once daily Days 4 to 7: 0.5 mg by mouth twice daily   Maintenance (>= Day 8): 1 mg by mouth twice daily for 11 weeks. We may consider a temporary or permanent dose reduction if usual dose of 1 mg twice per day is not tolerated.   Slow decrease of smoking:   If you are not able or willing to quit abruptly, begin treatment with vareniciline ( Chantix ) and reduce smoking by 50% from baseline within the first 4 weeks, by an additional 50% in the next 4 weeks, and continue reducing with the goal of complete abstinence by 12 weeks. If successfully quits smoking at the end of the 12 weeks, may continue for another 12 weeks to help maintain success. If you are motivated to quit and do not succeed in stopping smoking during prior therapy, or relapse after treatment, I would encourage you to make another attempt with varenicline  ( Chantix ) once factors contributing to the failed attempt have been identified and addressed. Health Maintenance for Postmenopausal Women Menopause is a normal process in which your ability to get pregnant comes to an end. This process happens slowly over many months or years, usually between the ages of 6 and 18. Menopause is complete when you have missed your menstrual period for 12 months. It is  important to talk with your health care provider about some of the most common conditions that affect women after menopause (postmenopausal women). These include heart disease, cancer, and bone loss (osteoporosis). Adopting a healthy lifestyle and getting preventive care can help to promote your health and wellness. The actions you take can also lower your chances of developing some of these common conditions. What are the signs and symptoms of menopause? During menopause, you may have the following symptoms: Hot flashes. These can be moderate or severe. Night sweats. Decrease in sex drive. Mood swings. Headaches. Tiredness (fatigue). Irritability. Memory problems. Problems falling asleep or staying asleep. Talk with your health care provider about treatment options for your symptoms. Do I need hormone replacement therapy? Hormone replacement therapy is effective in treating symptoms that are caused by menopause, such as hot flashes and night sweats. Hormone replacement carries certain risks, especially as you become older. If you are thinking about using estrogen or estrogen with progestin, discuss the benefits and risks with your health care provider. How can I reduce my risk for heart disease and stroke? The risk of heart disease, heart attack, and stroke increases as you age. One of the causes may be a change in the body's hormones during menopause. This can affect how your body uses dietary fats, triglycerides, and cholesterol. Heart attack and stroke are medical emergencies. There are many things  that you can do to help prevent heart disease and stroke. Watch your blood pressure High blood pressure causes heart disease and increases the risk of stroke. This is more likely to develop in people who have high blood pressure readings or are overweight. Have your blood pressure checked: Every 3-5 years if you are 35-14 years of age. Every year if you are 82 years old or older. Eat a healthy  diet  Eat a diet that includes plenty of vegetables, fruits, low-fat dairy products, and lean protein. Do not eat a lot of foods that are high in solid fats, added sugars, or sodium. Get regular exercise Get regular exercise. This is one of the most important things you can do for your health. Most adults should: Try to exercise for at least 150 minutes each week. The exercise should increase your heart rate and make you sweat (moderate-intensity exercise). Try to do strengthening exercises at least twice each week. Do these in addition to the moderate-intensity exercise. Spend less time sitting. Even light physical activity can be beneficial. Other tips Work with your health care provider to achieve or maintain a healthy weight. Do not use any products that contain nicotine or tobacco. These products include cigarettes, chewing tobacco, and vaping devices, such as e-cigarettes. If you need help quitting, ask your health care provider. Know your numbers. Ask your health care provider to check your cholesterol and your blood sugar (glucose). Continue to have your blood tested as directed by your health care provider. Do I need screening for cancer? Depending on your health history and family history, you may need to have cancer screenings at different stages of your life. This may include screening for: Breast cancer. Cervical cancer. Lung cancer. Colorectal cancer. What is my risk for osteoporosis? After menopause, you may be at increased risk for osteoporosis. Osteoporosis is a condition in which bone destruction happens more quickly than new bone creation. To help prevent osteoporosis or the bone fractures that can happen because of osteoporosis, you may take the following actions: If you are 26-64 years old, get at least 1,000 mg of calcium  and at least 600 international units (IU) of vitamin D  per day. If you are older than age 90 but younger than age 15, get at least 1,200 mg of calcium   and at least 600 international units (IU) of vitamin D  per day. If you are older than age 40, get at least 1,200 mg of calcium  and at least 800 international units (IU) of vitamin D  per day. Smoking and drinking excessive alcohol increase the risk of osteoporosis. Eat foods that are rich in calcium  and vitamin D , and do weight-bearing exercises several times each week as directed by your health care provider. How does menopause affect my mental health? Depression may occur at any age, but it is more common as you become older. Common symptoms of depression include: Feeling depressed. Changes in sleep patterns. Changes in appetite or eating patterns. Feeling an overall lack of motivation or enjoyment of activities that you previously enjoyed. Frequent crying spells. Talk with your health care provider if you think that you are experiencing any of these symptoms. General instructions See your health care provider for regular wellness exams and vaccines. This may include: Scheduling regular health, dental, and eye exams. Getting and maintaining your vaccines. These include: Influenza vaccine. Get this vaccine each year before the flu season begins. Pneumonia vaccine. Shingles vaccine. Tetanus, diphtheria, and pertussis (Tdap) booster vaccine. Your health care provider may also  recommend other immunizations. Tell your health care provider if you have ever been abused or do not feel safe at home. Summary Menopause is a normal process in which your ability to get pregnant comes to an end. This condition causes hot flashes, night sweats, decreased interest in sex, mood swings, headaches, or lack of sleep. Treatment for this condition may include hormone replacement therapy. Take actions to keep yourself healthy, including exercising regularly, eating a healthy diet, watching your weight, and checking your blood pressure and blood sugar levels. Get screened for cancer and depression. Make sure that  you are up to date with all your vaccines. This information is not intended to replace advice given to you by your health care provider. Make sure you discuss any questions you have with your health care provider. Document Revised: 08/04/2020 Document Reviewed: 08/04/2020 Elsevier Patient Education  2024 [employer].

## 2023-11-09 NOTE — Assessment & Plan Note (Signed)
 She has started to smoke again. She is not vaping. Resume chantix . Discussed black box warning as it r/t SI.

## 2023-11-09 NOTE — Assessment & Plan Note (Signed)
 She does not screen for breast cancer, colon cancer or cervical cancer. She declines CT lung cancer screen at this time.  Deferred CBE due to patient preference.

## 2023-11-09 NOTE — Progress Notes (Signed)
 Assessment & Plan:  Annual physical exam Assessment & Plan: She does not screen for breast cancer, colon cancer or cervical cancer. She declines CT lung cancer screen at this time.  Deferred CBE due to patient preference.     Type 2 diabetes mellitus without complication, without long-term current use of insulin  (HCC) -     Microalbumin / creatinine urine ratio; Future -     Hemoglobin A1c; Future  Vitamin D  deficiency -     Cholecalciferol ; 50,000 units PO qwk for 8 weeks.  Dispense: 8 tablet; Refill: 0  Encounter for smoking cessation counseling Assessment & Plan: She has started to smoke again. She is not vaping. Resume chantix . Discussed black box warning as it r/t SI.   Orders: -     Varenicline  Tartrate; Days 1 to 3: 0.5 mg  PO once daily Days 4 to 7: 0.5 mg  PO twice daily Select quit date within 7 days of starting chantix .  Dispense: 11 tablet; Refill: 0  Encounter for administration of vaccine -     Hepatitis B surface antibody,quantitative; Future  Grief reaction Assessment & Plan: Chronic, improved. Continue prozac  20mg  every day.       Return precautions given.   Risks, benefits, and alternatives of the medications and treatment plan prescribed today were discussed, and patient expressed understanding.   Education regarding symptom management and diagnosis given to patient on AVS either electronically or printed.  Return in about 3 months (around 02/09/2024).  Rollene Northern, FNP  Subjective:    Patient ID: Shelley Garcia, female    DOB: 1966-01-11, 58 y.o.   MRN: [id number]  CC: Shelley Garcia is a 58 y.o. female who presents today for physical exam.    HPI: Feels well today No new complaints  She started prozac  in June of this year and is feeling 'so much better'. She is pleased with medication and dose.      She does not screen for breast cancer, colon cancer or cervical cancer. Bone Health screening/DEXA for 65+: No increased fracture  risk. Defer screening at this time.  Lung Cancer Screening: She declines  No family history of AAA.        Tetanus - UTD        Pneumococcal - Complete Exercise: Gets regular exercise.   Alcohol use:  rarely Smoking/tobacco use: She has started smoking again , 1 pack per day.  She used chantix  in the past.She is interested in starting again.  No h/o SI. Denies SI/HI.   Health Maintenance  Topic Date Due   Hepatitis B Vaccine (1 of 3 - 19+ 3-dose series) Never done   Pap with HPV screening  Never done   Yearly kidney health urinalysis for diabetes  03/02/2018   COVID-19 Vaccine (5 - 2024-25 season) 11/28/2022   Complete foot exam   03/13/2023   Eye exam for diabetics  04/08/2023   Hemoglobin A1C  10/05/2023   Mammogram  03/09/2024*   Flu Shot  06/26/2024*   Yearly kidney function blood test for diabetes  04/06/2024   DTaP/Tdap/Td vaccine (2 - Td or Tdap) 02/11/2025   Pneumococcal Vaccine for age over 36  Completed   Hepatitis C Screening  Completed   HIV Screening  Completed   Zoster (Shingles) Vaccine  Completed   HPV Vaccine  Aged Out   Meningitis B Vaccine  Aged Out   Pneumococcal Vaccine  Discontinued   Colon Cancer Screening  Discontinued  *Topic was postponed. The  date shown is not the original due date.    ALLERGIES: Metformin, Metformin and related, and Codeine  Current Outpatient Medications on File Prior to Visit  Medication Sig Dispense Refill   FLUoxetine  (PROZAC ) 20 MG capsule Take 1 capsule (20 mg total) by mouth every morning. 30 capsule 3   tirzepatide  (MOUNJARO ) 12.5 MG/0.5ML Pen Inject 12.5 mg into the skin once a week. 6 mL 3   Blood Glucose Monitoring Suppl (FREESTYLE LITE) DEVI Use as instructed 2 to 3 times daily to check blood sugar.  Dx E11.8 (Patient not taking: Reported on 11/09/2023) 1 each 0   glucose blood (FREESTYLE LITE) test strip Use as instructed to check blood sugar 2-3 times a day.  Dx  E11.8 (Patient not taking: Reported on 11/09/2023)  100 each 5   Lancets (FREESTYLE) lancets Use to check blood sugar 2-3 times daily.  Dx E11.8 (Patient not taking: Reported on 11/09/2023) 100 each 5   No current facility-administered medications on file prior to visit.    Review of Systems  Constitutional:  Negative for chills, fever and unexpected weight change.  HENT:  Negative for congestion.   Respiratory:  Negative for cough.   Cardiovascular:  Negative for chest pain, palpitations and leg swelling.  Gastrointestinal:  Negative for nausea and vomiting.  Musculoskeletal:  Negative for arthralgias and myalgias.  Skin:  Negative for rash.  Neurological:  Negative for headaches.  Hematological:  Negative for adenopathy.  Psychiatric/Behavioral:  Negative for confusion.       Objective:    BP 130/74   Pulse 80   Temp 98.2 F (36.8 C) (Oral)   Ht 5' 4 (1.626 m)   Wt 174 lb (78.9 kg)   LMP  (LMP Unknown)   SpO2 97%   BMI 29.87 kg/m   BP Readings from Last 3 Encounters:  11/09/23 130/74  09/08/23 130/76  03/10/23 132/80   Wt Readings from Last 3 Encounters:  11/09/23 174 lb (78.9 kg)  09/08/23 178 lb 12.8 oz (81.1 kg)  03/10/23 198 lb 6.4 oz (90 kg)    Physical Exam Vitals reviewed.  Constitutional:      Appearance: She is well-developed.  Eyes:     Conjunctiva/sclera: Conjunctivae normal.  Cardiovascular:     Rate and Rhythm: Normal rate and regular rhythm.     Pulses: Normal pulses.     Heart sounds: Normal heart sounds.  Pulmonary:     Effort: Pulmonary effort is normal.     Breath sounds: Normal breath sounds. No wheezing, rhonchi or rales.  Skin:    General: Skin is warm and dry.  Neurological:     Mental Status: She is alert.  Psychiatric:        Speech: Speech normal.        Behavior: Behavior normal.        Thought Content: Thought content normal.

## 2023-11-09 NOTE — Assessment & Plan Note (Signed)
 Chronic, improved. Continue prozac  20mg  every day.

## 2023-11-10 ENCOUNTER — Other Ambulatory Visit (HOSPITAL_COMMUNITY): Payer: Self-pay

## 2023-11-10 ENCOUNTER — Other Ambulatory Visit (HOSPITAL_BASED_OUTPATIENT_CLINIC_OR_DEPARTMENT_OTHER): Payer: Self-pay

## 2023-11-10 MED ORDER — VARENICLINE TARTRATE 0.5 MG PO TABS
ORAL_TABLET | ORAL | 0 refills | Status: AC
  Filled 2023-11-10: qty 11, 7d supply, fill #0

## 2023-11-11 ENCOUNTER — Other Ambulatory Visit: Payer: Self-pay

## 2023-11-29 ENCOUNTER — Other Ambulatory Visit: Payer: Self-pay

## 2023-11-29 ENCOUNTER — Other Ambulatory Visit (HOSPITAL_COMMUNITY): Payer: Self-pay

## 2023-12-26 ENCOUNTER — Other Ambulatory Visit: Payer: Self-pay

## 2024-01-21 ENCOUNTER — Other Ambulatory Visit: Payer: Self-pay | Admitting: Family

## 2024-01-21 DIAGNOSIS — E119 Type 2 diabetes mellitus without complications: Secondary | ICD-10-CM

## 2024-01-23 ENCOUNTER — Other Ambulatory Visit: Payer: Self-pay

## 2024-01-23 ENCOUNTER — Other Ambulatory Visit (HOSPITAL_COMMUNITY): Payer: Self-pay

## 2024-01-23 MED ORDER — MOUNJARO 12.5 MG/0.5ML ~~LOC~~ SOAJ
12.5000 mg | SUBCUTANEOUS | 3 refills | Status: AC
  Filled 2024-01-23: qty 6, 84d supply, fill #0
  Filled 2024-04-16: qty 6, 84d supply, fill #1

## 2024-01-24 ENCOUNTER — Other Ambulatory Visit (HOSPITAL_COMMUNITY): Payer: Self-pay

## 2024-01-27 NOTE — Addendum Note (Signed)
 Addended by: MARYLEN PRO A on: 01/27/2024 04:46 PM   Modules accepted: Orders

## 2024-02-03 ENCOUNTER — Other Ambulatory Visit

## 2024-02-09 ENCOUNTER — Ambulatory Visit: Admitting: Family

## 2024-04-16 ENCOUNTER — Other Ambulatory Visit: Payer: Self-pay
